# Patient Record
Sex: Male | Born: 1995 | Race: White | Hispanic: No | Marital: Single | Smoking: Never smoker
Health system: Southern US, Community
[De-identification: ages and names within clinical notes are randomized; demographics above are authoritative.]

## PROBLEM LIST (undated history)

## (undated) DIAGNOSIS — F319 Bipolar disorder, unspecified: Secondary | ICD-10-CM

## (undated) DIAGNOSIS — I456 Pre-excitation syndrome: Secondary | ICD-10-CM

---

## 2020-12-28 ENCOUNTER — Encounter (HOSPITAL_COMMUNITY): Payer: Self-pay

## 2020-12-28 ENCOUNTER — Other Ambulatory Visit: Payer: Self-pay

## 2020-12-28 ENCOUNTER — Emergency Department (HOSPITAL_COMMUNITY)
Admission: EM | Admit: 2020-12-28 | Discharge: 2020-12-30 | Disposition: A | Discharge status: Other Acute Inpatient Hospital | Payer: Medicare Other | Attending: Emergency Medicine | Admitting: Emergency Medicine

## 2020-12-28 DIAGNOSIS — F840 Autistic disorder: Secondary | ICD-10-CM

## 2020-12-28 DIAGNOSIS — F22 Delusional disorders: Secondary | ICD-10-CM | POA: Insufficient documentation

## 2020-12-28 DIAGNOSIS — Z20822 Contact with and (suspected) exposure to covid-19: Secondary | ICD-10-CM | POA: Diagnosis not present

## 2020-12-28 DIAGNOSIS — Z046 Encounter for general psychiatric examination, requested by authority: Secondary | ICD-10-CM | POA: Diagnosis present

## 2020-12-28 DIAGNOSIS — F319 Bipolar disorder, unspecified: Secondary | ICD-10-CM

## 2020-12-28 DIAGNOSIS — F250 Schizoaffective disorder, bipolar type: Secondary | ICD-10-CM | POA: Diagnosis not present

## 2020-12-28 DIAGNOSIS — R45851 Suicidal ideations: Secondary | ICD-10-CM | POA: Insufficient documentation

## 2020-12-28 LAB — CBC
HCT: 47.5 % (ref 39.0–52.0)
Hemoglobin: 15.5 g/dL (ref 13.0–17.0)
MCH: 31.6 pg (ref 26.0–34.0)
MCHC: 32.6 g/dL (ref 30.0–36.0)
MCV: 96.7 fL (ref 80.0–100.0)
Platelets: 247 10*3/uL (ref 150–400)
RBC: 4.91 MIL/uL (ref 4.22–5.81)
RDW: 12.7 % (ref 11.5–15.5)
WBC: 8.2 10*3/uL (ref 4.0–10.5)
nRBC: 0 % (ref 0.0–0.2)

## 2020-12-28 NOTE — ED Triage Notes (Signed)
Pt arrives with [facility] after mother petitioned IVC papers stating pt vocalized suicidal ideation. Pt denies and says he is unsure why he is here.

## 2020-12-29 DIAGNOSIS — F840 Autistic disorder: Secondary | ICD-10-CM

## 2020-12-29 DIAGNOSIS — F319 Bipolar disorder, unspecified: Secondary | ICD-10-CM

## 2020-12-29 DIAGNOSIS — F250 Schizoaffective disorder, bipolar type: Secondary | ICD-10-CM | POA: Diagnosis not present

## 2020-12-29 LAB — COMPREHENSIVE METABOLIC PANEL
ALT: 32 U/L (ref 0–44)
AST: 27 U/L (ref 15–41)
Albumin: 4.8 g/dL (ref 3.5–5.0)
Alkaline Phosphatase: 71 U/L (ref 38–126)
Anion gap: 9 (ref 5–15)
BUN: 17 mg/dL (ref 6–20)
CO2: 27 mmol/L (ref 22–32)
Calcium: 9.5 mg/dL (ref 8.9–10.3)
Chloride: 104 mmol/L (ref 98–111)
Creatinine, Ser: 0.8 mg/dL (ref 0.61–1.24)
GFR, Estimated: >60 mL/min (ref >60)
Glucose, Bld: 113 mg/dL — ABNORMAL HIGH (ref 70–99)
Potassium: 3.6 mmol/L (ref 3.5–5.1)
Sodium: 140 mmol/L (ref 135–145)
Total Bilirubin: 1.4 mg/dL — ABNORMAL HIGH (ref 0.3–1.2)
Total Protein: 7.4 g/dL (ref 6.5–8.1)

## 2020-12-29 LAB — ACETAMINOPHEN LEVEL: Acetaminophen (Tylenol), Serum: <10 ug/mL — ABNORMAL LOW (ref 10–30)

## 2020-12-29 LAB — RAPID URINE DRUG SCREEN, HOSP PERFORMED
Amphetamines: NOT DETECTED
Barbiturates: NOT DETECTED
Benzodiazepines: NOT DETECTED
Cocaine: NOT DETECTED
Opiates: NOT DETECTED
Tetrahydrocannabinol: NOT DETECTED

## 2020-12-29 LAB — ETHANOL: Alcohol, Ethyl (B): <10 mg/dL (ref <10)

## 2020-12-29 LAB — RESP PANEL BY RT-PCR (FLU A&B, COVID) ARPGX2
Influenza A by PCR: NEGATIVE
Influenza B by PCR: NEGATIVE
SARS Coronavirus 2 by RT PCR: NEGATIVE

## 2020-12-29 LAB — SALICYLATE LEVEL: Salicylate Lvl: <7 mg/dL — ABNORMAL LOW (ref 7.0–30.0)

## 2020-12-29 MED ORDER — RISPERIDONE 1 MG PO TBDP
2.0000 mg | ORAL_TABLET | Freq: Two times a day (BID) | ORAL | Status: DC
  Administered 2020-12-29 (×2): 2 mg via ORAL
  Filled 2020-12-29 (×3): qty 2

## 2020-12-29 NOTE — ED Provider Notes (Signed)
Emergency Medicine Observation Re-evaluation Note  Miguel Shea is a 24 y.o. male, seen on rounds today.  Pt initially presented to the ED for complaints of IVC Currently, the patient is awaiting TTS evaluation  Physical Exam  BP 115/74 (BP Location: Left Arm)   Pulse 72   Temp 99.4 F (37.4 C)   Resp 16   Ht 6' (1.829 m)   Wt 81.6 kg   SpO2 100%   BMI 24.41 kg/m  Physical Exam General: Sleeping in no distress Psych: Calm  ED Course / MDM  EKG:    I have reviewed the labs performed to date as well as medications administered while in observation.  Recent changes in the last 24 hours include none.  Plan  Current plan is for TTS evaluation. Patient is under full IVC at this time.   Pollyann Savoy, MD 12/29/20 [phone]

## 2020-12-29 NOTE — Consult Note (Signed)
Patient has documented history Bipolar 1 disorder. Patient assessed by this nurse practitioner along with Dr Nelly Rout.  Patient alert and oriented, participates reluctantly in assessment.  Patient presents with guarded affect and appears to be a poor historian. Patient reports current stressor includes and "falling out with my mom."  Patient appears paranoid, states "I am not sure why you are asking me questions." Patient unable to recall current psychotropic medications. Medications: -Risperidone M tab 2 mg twice daily  Inpatient psychiatric treatment recommended.

## 2020-12-29 NOTE — BH Assessment (Addendum)
[facility] Assessment Progress Note  Per Nelly Rout, MD, this pt requires psychiatric hospitalization at this time.  Pt presents under IVC initiated by pt's mother and upheld by EDP Drema Pry, MD.  The following facilities have been contacted to seek placement for this pt, with results as noted:  Beds available, information sent, decision pending: Beckie Busing [facility]  Unable to reach: Turner Daniels (left message at 15:46)   Doylene Canning, MA Behavioral Health Coordinator [phone]

## 2020-12-29 NOTE — BH Assessment (Signed)
Aloha Gell, RN notified through secure chat that per Berneice Heinrich, NP, patient is recommend for inpatient treatment.  Patient mother Natalia Leatherwood [phone] would like a call about patient treatment.

## 2020-12-29 NOTE — Care Management (Signed)
Writer informed the ERMD of the disposition as well as the Press photographer.

## 2020-12-29 NOTE — ED Provider Notes (Signed)
[facility]   CSN: [id number] Arrival date & time: 12/28/20  2301     History Chief Complaint  Patient presents with  . IVC    Miguel Shea is a 24 y.o. male.  Patient to ED under IVC filed by his mother who is concerned about paranoid behavior and suicidal ideations. The patient states he is not aware of why he is here. He denies SI/HI/AVH. He denies recent illness or physical complaints.   The history is provided by the patient. No language interpreter was used.       History reviewed. No pertinent past medical history.  There are no problems to display for this patient.   History reviewed. No pertinent surgical history.     No family history on file.  Social History   Tobacco Use  . Smoking status: Never Smoker  . Smokeless tobacco: Never Used  Substance Use Topics  . Alcohol use: Never  . Drug use: Never    Home Medications Prior to Admission medications   Not on File    Allergies    Patient has no known allergies.  Review of Systems   Review of Systems  Constitutional: Negative for chills and fever.  HENT: Negative.   Respiratory: Negative.   Cardiovascular: Negative.   Gastrointestinal: Negative.   Musculoskeletal: Negative.   Skin: Negative.   Neurological: Negative.   Psychiatric/Behavioral: Negative for self-injury and suicidal ideas.    Physical Exam Updated Vital Signs BP (!) 145/110 (BP Location: Left Arm)   Pulse 90   Temp (!) 100.7 F (38.2 C) (Oral)   Resp 16   Ht 6' (1.829 m)   Wt 81.6 kg   SpO2 99%   BMI 24.41 kg/m   Physical Exam Vitals and nursing note reviewed.  Constitutional:      Appearance: He is well-developed and well-nourished.  HENT:     Head: Normocephalic.  Cardiovascular:     Rate and Rhythm: Normal rate and regular rhythm.  Pulmonary:     Effort: Pulmonary effort is normal.     Breath sounds: Normal breath sounds.  Abdominal:     General: Bowel  sounds are normal.     Palpations: Abdomen is soft.     Tenderness: There is no abdominal tenderness. There is no guarding or rebound.  Musculoskeletal:        General: Normal range of motion.     Cervical back: Normal range of motion and neck supple.  Skin:    General: Skin is warm and dry.     Findings: No rash.  Neurological:     Mental Status: He is alert and oriented to person, place, and time.  Psychiatric:        Attention and Perception: Perception normal.        Mood and Affect: Mood and affect normal.        Speech: Speech normal.        Behavior: Behavior is cooperative.        Thought Content: Thought content does not include homicidal or suicidal ideation.     ED Results / Procedures / Treatments   Labs (all labs ordered are listed, but only abnormal results are displayed) Labs Reviewed  COMPREHENSIVE METABOLIC PANEL - Abnormal; Notable for the following components:      Result Value   Glucose, Bld 113 (*)    Total Bilirubin 1.4 (*)    All other components within normal limits  SALICYLATE LEVEL -  Abnormal; Notable for the following components:   Salicylate Lvl <7.0 (*)    All other components within normal limits  ACETAMINOPHEN LEVEL - Abnormal; Notable for the following components:   Acetaminophen (Tylenol), Serum <10 (*)    All other components within normal limits  RESP PANEL BY RT-PCR (FLU A&B, COVID) ARPGX2  ETHANOL  CBC  RAPID URINE DRUG SCREEN, HOSP PERFORMED    EKG None  Radiology No results found.  Procedures Procedures (including critical care time)  Medications Ordered in ED Medications - No data to display  ED Course  I have reviewed the triage vital signs and the nursing notes.  Pertinent labs & imaging results that were available during my care of the patient were reviewed by me and considered in my medical decision making (see chart for details).    MDM Rules/Calculators/A&P                          Patient to ED under IVC  citing that he is not sleeping; has made suicidal statements; has made statements regarding a chip in his head that tells him what to do; auditory hallucinations; paranoia surrounding the gov't.   The patient denies all. He presents with low grade temp of 100.7. No ss/sxs of illness. Labs pending.   TTS evaluation required for further evaluation/disposition recommendations.  The patient presents to the desk numerous times, >10 in short period of time, concerned about wrist injury and numbness in the hand. No injury suspected on exam. Behavior suggestive of paranoid fixation on injury.   6:30 - patient is still waiting on TTS consultation and recommendations.     Final Clinical Impression(s) / ED Diagnoses Final diagnoses:  None   1. IVC 2. Paranoia 3. SI  Rx / DC Orders ED Discharge Orders    None       Elpidio Anis, PA-C 12/29/20 6433    Nira Conn, MD 01/01/21 1407

## 2020-12-29 NOTE — ED Notes (Signed)
Pt's one bag of belongings placed in locker 32.  [facility] called a message left for transport.

## 2020-12-29 NOTE — Care Management (Signed)
   Patient has been accepted to [facility]  Patient can come at any time  Accepting Dr. Catha Gosselin  Report [phone]

## 2020-12-29 NOTE — BH Assessment (Signed)
Comprehensive Clinical Assessment (CCA) Note  12/29/2020 Miguel Shea [id number]  Miguel Shea is a 24 year old male presenting to [facility] under IVC by a family member due to patient making suicidal comments. IVC reads verbatim, "respondent is not sleeping, he states he will kill himself, he stated he would kill himself if a girl on the internet does not love him, he says the chip in his brain tells him what to do, he states the voices talk to him, he is being haunted by the government".  Patient reports that his mother IVC'd him for "no reason". Patient reports that his mom wants to "control him" and reports that his mom is diagnosed with "bipolar disorder". Patient makes several statements about his mom such as "she is crazy, and she is trying to mess up the situation I'm in". Patient presents as guarded and does not elaborate about the situation he is in but reports "its personal". Patient reports that he went to [place] and his mom did not like it and patient states "she just need to let me go and grow up".   Patient reports living in his grandmother house alone. 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 Patient denies having a mental health disorder, denies inpatient and outpatient treatment. Patient denies SI/HI/AVH and substance use. Patient denies history of SI. Patient denies everything that was stated in IVC.   Collateral information gathered from patient mom Izekiel Flegel who reports that patient has been hospitalized 5 times in his life for paranoid delusions and manic behaviors. Mom reports that patient was hospitalized at [facility] and in [place] in the past few months due to ongoing psychosis and delusions. Mom reports that patient has delusions of being in "battle royal" where the government has a Nurse, children's out to kill him. Mom reports that patient reported that he needed to save a girl from  being killed and if he cannot save the girl, he will kill himself. Mom report that patient was awarded disability and he received a lump sum of money in his account. Mom reports that one day patient took her cat with plans to drive to [place] to help this girl. Mom reports that patient stopped at a hotel in [place], and she was able to call law enforcement to do a wellness check. Patient voluntarily went to hospital and was kept for a few days. Mom states the plan was for [place] hospital staff to drop patient off at airport and he was going to fly back to [facility] but patient bought a plan ticket to [place]. Mom reports that patient went to [place] and [place] and was trying to get his identity changed so he couldn't be found by the government. Mom reports calling law enforcement and having patient be placed on a national database when he travels. Mom reports that patient came home on 12/27/20 and on 12/28/20 patient shared with his mom that he had a chip in his head that tells him who is dangerous. Mom reports that patient was talking about getting into his father gun safe for protection. Mom reports that patient father died about two years ago and no one knows the combination to the safe, however, patient use to have the combination but cannot remember it.  Mom reports that patient older brother petitioned to become guardian however due to patient leaving, the court date was rescheduled for next month.  Mom thinks patient needs inpatient treatment. Mom reports that patient has history  of taking lithium but is currently taking Risperdal and other medications, but patient is not compliant with his medications. Mom reports patient having diagnosis of schizoaffective disorder, bipolar type and autism.  Disposition: Per Berneice Heinrich, NP, patient is recommended for inpatient treatment.   Chief Complaint:  Chief Complaint  Patient presents with  . IVC   Visit Diagnosis: Schizoaffective disorder, bipolar type- Per report  from mother   CCA Screening, Triage and Referral (STR)  Patient Reported Information How did you hear about [place]? Family/Friend  Referral name: No data recorded Referral phone number: No data recorded  Whom do you see for routine medical problems? No data recorded Practice/Facility Name: No data recorded Practice/Facility Phone Number: No data recorded Name of Contact: No data recorded Contact Number: No data recorded Contact Fax Number: No data recorded Prescriber Name: No data recorded Prescriber Address (if known): No data recorded  What Is the Reason for Your Visit/Call Today? No data recorded How Long Has This Been Causing You Problems? No data recorded What Do You Feel Would Help You the Most Today? No data recorded  Have You Recently Been in Any Inpatient Treatment (Hospital/Detox/Crisis Center/28-Day Program)? Yes  Name/Location of Program/Hospital:No data recorded How Long Were You There? No data recorded When Were You Discharged? No data recorded  Have You Ever Received Services From [facility] Before? No  Who Do You See at [facility]? No data recorded  Have You Recently Had Any Thoughts About Hurting Yourself? No  Are You Planning to Commit Suicide/Harm Yourself At This time? No   Have you Recently Had Thoughts About Hurting Someone Karolee Ohs? No  Explanation: No data recorded  Have You Used Any Alcohol or Drugs in the Past 24 Hours? No  How Long Ago Did You Use Drugs or Alcohol? No data recorded What Did You Use and How Much? No data recorded  Do You Currently Have a Therapist/Psychiatrist? No  Name of Therapist/Psychiatrist: No data recorded  Have You Been Recently Discharged From Any Office Practice or Programs? No  Explanation of Discharge From Practice/Program: No data recorded    CCA Screening Triage Referral Assessment Type of Contact: Tele-Assessment  Is this Initial or Reassessment? Initial Assessment  Date Telepsych consult ordered in [facility]:   No data recorded Time Telepsych consult ordered in [facility]:  No data recorded  Patient Reported Information Reviewed? Yes  Patient Left Without Being Seen? No data recorded Reason for Not Completing Assessment: No data recorded  Collateral Involvement: Natalia Leatherwood Arquette-mom   Does Patient Have a Automotive engineer Guardian? No data recorded Name and Contact of Legal Guardian: No data recorded If Minor and Not Living with Parent(s), Who has Custody? No data recorded Is CPS involved or ever been involved? Never  Is APS involved or ever been involved? Never   Patient Determined To Be At Risk for Harm To Self or Others Based on Review of Patient Reported Information or Presenting Complaint? Yes, for Self-Harm  Method: No data recorded Availability of Means: No data recorded Intent: No data recorded Notification Required: No data recorded Additional Information for Danger to Others Potential: No data recorded Additional Comments for Danger to Others Potential: No data recorded Are There Guns or Other Weapons in Your Home? No data recorded Types of Guns/Weapons: No data recorded Are These Weapons Safely Secured?                            No data  recorded Who Could Verify You Are Able To Have These Secured: No data recorded Do You Have any Outstanding Charges, Pending Court Dates, Parole/Probation? No data recorded Contacted To Inform of Risk of Harm To Self or Others: No data recorded  Location of Assessment: [facility] ED   Does Patient Present under Involuntary Commitment? Yes  IVC Papers Initial File Date: 12/28/2020   [place]   Patient Currently Receiving the Following Services: Medication Management   Determination of Need: Emergent (2 hours)   Options For Referral: No data recorded    CCA Biopsychosocial Intake/Chief Complaint:  IVC  Current Symptoms/Problems: paranoid delusions   Patient Reported Schizophrenia/Schizoaffective Diagnosis in  Past: Yes (per mom report)   Strengths: UTA  Preferences: UTA  Abilities: UTA   Type of Services Patient Feels are Needed: No data recorded  Initial Clinical Notes/Concerns: guarded   Mental Health Symptoms Depression:  None   Duration of Depressive symptoms: No data recorded  Mania:  Recklessness   Anxiety:   None   Psychosis:  Delusions   Duration of Psychotic symptoms: Greater than six months   Trauma:  None   Obsessions:  None   Compulsions:  None   Inattention:  None   Hyperactivity/Impulsivity:  N/A   Oppositional/Defiant Behaviors:  N/A   Emotional Irregularity:  N/A   Other Mood/Personality Symptoms:  No data recorded   Mental Status Exam Appearance and self-care  Stature:  Average   Weight:  Average weight   Clothing:  Age-appropriate   Grooming:  Normal   Cosmetic use:  None   Posture/gait:  Normal   Motor activity:  Not Remarkable   Sensorium  Attention:  Normal   Concentration:  Normal   Orientation:  X5   Recall/memory:  Normal   Affect and Mood  Affect:  Full Range   Mood:  No data recorded  Relating  Eye contact:  Normal   Facial expression:  Responsive   Attitude toward examiner:  Guarded   Thought and Language  Speech flow: Clear and Coherent   Thought content:  Appropriate to Mood and Circumstances   Preoccupation:  None   Hallucinations:  None   Organization:  No data recorded  Affiliated Computer ServicesExecutive Functions  Fund of Knowledge:  Fair   Intelligence:  Average   Abstraction:  Normal   Judgement:  Impaired   Reality Testing:  Distorted   Insight:  Fair   Decision Making:  Impulsive   Social Functioning  Social Maturity:  No data recorded  Social Judgement:  No data recorded  Stress  Stressors:  No data recorded  Coping Ability:  Overwhelmed   Skill Deficits:  Responsibility   Supports:  Family     Religion:    Leisure/Recreation:    Exercise/Diet:     CCA  Employment/Education Employment/Work Situation: Employment / Work Situation Employment situation: On disability Where was the patient employed at that time?: [employer] Has patient ever been in the Eli Lilly and Companymilitary?: No  Education: Education Is Patient Currently Attending School?: No Did Garment/textile technologistYou Graduate From McGraw-HillHigh School?: Yes Did Theme park managerYou Attend College?: Yes Did Designer, television/film setYou Attend Graduate School?: No Did You Have An Individualized Education Program (IIEP): No   CCA Family/Childhood History Family and Relationship History: Family history What is your sexual orientation?: UTA Has your sexual activity been affected by drugs, alcohol, medication, or emotional stress?: UTA  Childhood History:  Childhood History By whom was/is the patient raised?: Both parents Additional childhood history information: UTA Description of patient's  relationship with caregiver when they were a child: UTA Patient's description of current relationship with people who raised him/her: UTA How were you disciplined when you got in trouble as a child/adolescent?: UTA Does patient have siblings?: Yes  Child/Adolescent Assessment:     CCA Substance Use Alcohol/Drug Use: Alcohol / Drug Use Pain Medications: See MAR Prescriptions: See MAR Over the Counter: See MAR History of alcohol / drug use?: No history of alcohol / drug abuse                         ASAM's:  Six Dimensions of Multidimensional Assessment  Dimension 1:  Acute Intoxication and/or Withdrawal Potential:      Dimension 2:  Biomedical Conditions and Complications:      Dimension 3:  Emotional, Behavioral, or Cognitive Conditions and Complications:     Dimension 4:  Readiness to Change:     Dimension 5:  Relapse, Continued use, or Continued Problem Potential:     Dimension 6:  Recovery/Living Environment:     ASAM Severity Score:    ASAM Recommended Level of Treatment:     Substance use Disorder (SUD)    Recommendations for  Services/Supports/Treatments:    DSM5 Diagnoses: There are no problems to display for this patient.   Disposition: Per Berneice Heinrich, NP, patient is recommended for inpatient treatment.   Amos Micheals Shirlee More, [facility]

## 2020-12-29 NOTE — ED Notes (Signed)
TTS completed via televideo.

## 2020-12-29 NOTE — ED Notes (Signed)
Patient sitting in the doorway with no socks on. Patient stated his socks were too big. Sitter gave the patient a smaller size.

## 2020-12-30 NOTE — ED Provider Notes (Signed)
Emergency Medicine Observation Re-evaluation Note  Cruise Baumgardner is a 24 y.o. male, seen on rounds today.  Pt initially presented to the ED for complaints of IVC Currently, the patient is stable.  Physical Exam  BP 109/68 (BP Location: Right Arm)   Pulse 71   Temp 99.1 F (37.3 C) (Oral)   Resp 18   Ht 1.829 m (6')   Wt 81.6 kg   SpO2 97%   BMI 24.41 kg/m  Physical Exam General: wdwn male nad Cardiac: rrr Lungs: normal respiratory rate Psych: interactive   ED Course / MDM  EKG:    I have reviewed the labs performed to date as well as medications administered while in observation.  Recent changes in the last 24 hours include patient with placement at [facility].  Plan  Current plan is for psychiatric hospitalization. Patient is under full IVC at this time.   Margarita Grizzle, MD 12/30/20 [phone]

## 2021-03-01 ENCOUNTER — Encounter (HOSPITAL_COMMUNITY): Payer: Self-pay

## 2021-03-01 ENCOUNTER — Other Ambulatory Visit: Payer: Self-pay

## 2021-03-01 ENCOUNTER — Emergency Department (HOSPITAL_COMMUNITY)
Admission: EM | Admit: 2021-03-01 | Discharge: 2021-03-04 | Disposition: A | Discharge status: Home / Self Care | Payer: Medicare Other | Attending: Emergency Medicine | Admitting: Emergency Medicine

## 2021-03-01 DIAGNOSIS — F22 Delusional disorders: Secondary | ICD-10-CM | POA: Insufficient documentation

## 2021-03-01 DIAGNOSIS — I456 Pre-excitation syndrome: Secondary | ICD-10-CM | POA: Diagnosis not present

## 2021-03-01 DIAGNOSIS — F250 Schizoaffective disorder, bipolar type: Secondary | ICD-10-CM | POA: Insufficient documentation

## 2021-03-01 DIAGNOSIS — R519 Headache, unspecified: Secondary | ICD-10-CM | POA: Diagnosis present

## 2021-03-01 DIAGNOSIS — Z20822 Contact with and (suspected) exposure to covid-19: Secondary | ICD-10-CM | POA: Diagnosis not present

## 2021-03-01 DIAGNOSIS — F840 Autistic disorder: Secondary | ICD-10-CM | POA: Diagnosis not present

## 2021-03-01 DIAGNOSIS — F319 Bipolar disorder, unspecified: Secondary | ICD-10-CM | POA: Diagnosis present

## 2021-03-01 HISTORY — DX: Pre-excitation syndrome: I45.6

## 2021-03-01 HISTORY — DX: Bipolar disorder, unspecified: F31.9

## 2021-03-01 NOTE — ED Notes (Signed)
Called pt due to pt labels in trash can and no answer

## 2021-03-01 NOTE — ED Notes (Signed)
Pt came back inside with mother. Mother expressed concern for mental help. Pt expressed he just wanted a CT or MRI scan due to feeling fuzzy inside his head and that he wasn't having SI or HI

## 2021-03-01 NOTE — ED Triage Notes (Signed)
Pt c/o of headache for the past few months everyday. Denies nausea or photosensitivity

## 2021-03-02 ENCOUNTER — Other Ambulatory Visit: Payer: Self-pay

## 2021-03-02 ENCOUNTER — Emergency Department (HOSPITAL_COMMUNITY): Payer: Medicare Other

## 2021-03-02 DIAGNOSIS — F250 Schizoaffective disorder, bipolar type: Secondary | ICD-10-CM | POA: Diagnosis not present

## 2021-03-02 LAB — CBC WITH DIFFERENTIAL/PLATELET
Abs Immature Granulocytes: 0.03 10*3/uL (ref 0.00–0.07)
Basophils Absolute: 0.1 10*3/uL (ref 0.0–0.1)
Basophils Relative: 1 %
Eosinophils Absolute: 0 10*3/uL (ref 0.0–0.5)
Eosinophils Relative: 0 %
HCT: 45.7 % (ref 39.0–52.0)
Hemoglobin: 15.6 g/dL (ref 13.0–17.0)
Immature Granulocytes: 0 %
Lymphocytes Relative: 13 %
Lymphs Abs: 1.1 10*3/uL (ref 0.7–4.0)
MCH: 31.5 pg (ref 26.0–34.0)
MCHC: 34.1 g/dL (ref 30.0–36.0)
MCV: 92.3 fL (ref 80.0–100.0)
Monocytes Absolute: 0.6 10*3/uL (ref 0.1–1.0)
Monocytes Relative: 7 %
Neutro Abs: 6.6 10*3/uL (ref 1.7–7.7)
Neutrophils Relative %: 79 %
Platelets: 258 10*3/uL (ref 150–400)
RBC: 4.95 MIL/uL (ref 4.22–5.81)
RDW: 12.9 % (ref 11.5–15.5)
WBC: 8.4 10*3/uL (ref 4.0–10.5)
nRBC: 0 % (ref 0.0–0.2)

## 2021-03-02 LAB — COMPREHENSIVE METABOLIC PANEL
ALT: 33 U/L (ref 0–44)
AST: 33 U/L (ref 15–41)
Albumin: 4.7 g/dL (ref 3.5–5.0)
Alkaline Phosphatase: 58 U/L (ref 38–126)
Anion gap: 12 (ref 5–15)
BUN: 12 mg/dL (ref 6–20)
CO2: 23 mmol/L (ref 22–32)
Calcium: 9.6 mg/dL (ref 8.9–10.3)
Chloride: 104 mmol/L (ref 98–111)
Creatinine, Ser: 0.88 mg/dL (ref 0.61–1.24)
GFR, Estimated: >60 mL/min (ref >60)
Glucose, Bld: 122 mg/dL — ABNORMAL HIGH (ref 70–99)
Potassium: 3.4 mmol/L — ABNORMAL LOW (ref 3.5–5.1)
Sodium: 139 mmol/L (ref 135–145)
Total Bilirubin: 3.1 mg/dL — ABNORMAL HIGH (ref 0.3–1.2)
Total Protein: 7.1 g/dL (ref 6.5–8.1)

## 2021-03-02 LAB — RAPID URINE DRUG SCREEN, HOSP PERFORMED
Amphetamines: NOT DETECTED
Barbiturates: NOT DETECTED
Benzodiazepines: NOT DETECTED
Cocaine: NOT DETECTED
Opiates: NOT DETECTED
Tetrahydrocannabinol: NOT DETECTED

## 2021-03-02 LAB — ETHANOL: Alcohol, Ethyl (B): <10 mg/dL (ref <10)

## 2021-03-02 LAB — RESP PANEL BY RT-PCR (FLU A&B, COVID) ARPGX2
Influenza A by PCR: NEGATIVE
Influenza B by PCR: NEGATIVE
SARS Coronavirus 2 by RT PCR: NEGATIVE

## 2021-03-02 LAB — ACETAMINOPHEN LEVEL: Acetaminophen (Tylenol), Serum: <10 ug/mL — ABNORMAL LOW (ref 10–30)

## 2021-03-02 LAB — SALICYLATE LEVEL: Salicylate Lvl: <7 mg/dL — ABNORMAL LOW (ref 7.0–30.0)

## 2021-03-02 MED ORDER — RISPERIDONE 2 MG PO TBDP
2.0000 mg | ORAL_TABLET | Freq: Three times a day (TID) | ORAL | Status: DC | PRN
  Administered 2021-03-02 – 2021-03-03 (×4): 2 mg via ORAL
  Filled 2021-03-02 (×5): qty 1

## 2021-03-02 MED ORDER — DIVALPROEX SODIUM 250 MG PO DR TAB
500.0000 mg | DELAYED_RELEASE_TABLET | Freq: Two times a day (BID) | ORAL | Status: DC
  Administered 2021-03-02 – 2021-03-04 (×4): 500 mg via ORAL
  Filled 2021-03-02 (×4): qty 2

## 2021-03-02 MED ORDER — OLANZAPINE 5 MG PO TBDP
10.0000 mg | ORAL_TABLET | Freq: Every day | ORAL | Status: DC
  Administered 2021-03-02 – 2021-03-03 (×2): 10 mg via ORAL
  Filled 2021-03-02 (×3): qty 2

## 2021-03-02 MED ORDER — LORAZEPAM 1 MG PO TABS
1.0000 mg | ORAL_TABLET | ORAL | Status: AC | PRN
  Administered 2021-03-04: 1 mg via ORAL
  Filled 2021-03-02: qty 1

## 2021-03-02 MED ORDER — ZIPRASIDONE MESYLATE 20 MG IM SOLR: 20.0000 mg | INTRAMUSCULAR | Status: DC | PRN

## 2021-03-02 MED ORDER — BENZTROPINE MESYLATE 1 MG PO TABS
1.0000 mg | ORAL_TABLET | Freq: Every day | ORAL | Status: DC
  Administered 2021-03-02 – 2021-03-03 (×2): 1 mg via ORAL
  Filled 2021-03-02 (×2): qty 1

## 2021-03-02 MED ORDER — OLANZAPINE 5 MG PO TBDP
5.0000 mg | ORAL_TABLET | Freq: Every morning | ORAL | Status: DC
  Administered 2021-03-02 – 2021-03-04 (×3): 5 mg via ORAL
  Filled 2021-03-02 (×4): qty 1

## 2021-03-02 MED ORDER — LORAZEPAM 1 MG PO TABS
1.0000 mg | ORAL_TABLET | Freq: Once | ORAL | Status: AC
  Administered 2021-03-02: 1 mg via ORAL
  Filled 2021-03-02: qty 1

## 2021-03-02 NOTE — BH Assessment (Signed)
Ma Deberah Pelton, RN notified through secure chat that per Marciano Sequin, NP, pt is recommended for inpatient treatment.

## 2021-03-02 NOTE — ED Provider Notes (Signed)
[facility] EMERGENCY DEPARTMENT Provider Note   CSN: [id number] Arrival date & time: 03/01/21  2019     History Chief Complaint  Patient presents with  . Headache    Miguel Shea is a 25 y.o. male.  The history is provided by the patient and medical records.    25 year old male with history of autism, bipolar disorder, presenting to the ED for "help".  Patient initially reported to triage that he was having headaches for the past few months, he denies this to me.  He states "I think there is a chip in my brain that is keeping me from sleeping".  States this causes him to have random thoughts which he is having a hard time controlling.  When asked him about this he began speaking Laurance Flatten and how he asked him to go out for a beer so hopefully they can make peace between the [place] and [place].  He then began speaking about becoming an Hydrographic surveyor and felt like he had "potential".  He states he feels like he needs some medicine to calm him down.  He denies any suicidal ideation at present.  Denies any homicidal ideation.  Denies drug or alcohol abuse.  He states he has not been taking his psychiatric medications, cannot specify why.  He does admit he has not been sleeping for several days.  Past Medical History:  Diagnosis Date  . Wolff-Parkinson-White syndrome     Patient Active Problem List   Diagnosis Date Noted  . Autism spectrum disorder 12/29/2020  . Bipolar 1 disorder (HCC) 12/29/2020    History reviewed. No pertinent surgical history.     No family history on file.  Social History   Tobacco Use  . Smoking status: Never Smoker  . Smokeless tobacco: Never Used  Substance Use Topics  . Alcohol use: Never  . Drug use: Never    Home Medications Prior to Admission medications   Not on File    Allergies    Augmentin [amoxicillin-pot clavulanate]  Review of Systems   Review of Systems  Psychiatric/Behavioral: Positive for behavioral  problems.  All other systems reviewed and are negative.   Physical Exam Updated Vital Signs BP (!) 144/94 (BP Location: Right Arm)   Pulse 98   Temp 98.9 F (37.2 C) (Oral)   Resp 18   SpO2 100%   Physical Exam Vitals and nursing note reviewed.  Constitutional:      Appearance: He is well-developed and well-nourished.  HENT:     Head: Normocephalic and atraumatic.     Mouth/Throat:     Mouth: Oropharynx is clear and moist.  Eyes:     Extraocular Movements: EOM normal.     Conjunctiva/sclera: Conjunctivae normal.     Pupils: Pupils are equal, round, and reactive to light.     Comments: Pupils dilated  Cardiovascular:     Rate and Rhythm: Normal rate and regular rhythm.     Heart sounds: Normal heart sounds.  Pulmonary:     Effort: Pulmonary effort is normal.     Breath sounds: Normal breath sounds.  Abdominal:     General: Bowel sounds are normal.     Palpations: Abdomen is soft.  Musculoskeletal:        General: Normal range of motion.     Cervical back: Normal range of motion.  Skin:    General: Skin is warm and dry.  Neurological:     Mental Status: He is alert and oriented to  person, place, and time.  Psychiatric:        Mood and Affect: Mood and affect normal.     Comments: Tangential speech, requiring redirection several times, delusional thoughts expressed, throughout exam raising his arms up several times and shaking fists as if saying "victory" Denies SI/HI/AVH     ED Results / Procedures / Treatments   Labs (all labs ordered are listed, but only abnormal results are displayed) Labs Reviewed  COMPREHENSIVE METABOLIC PANEL - Abnormal; Notable for the following components:      Result Value   Potassium 3.4 (*)    Glucose, Bld 122 (*)    Total Bilirubin 3.1 (*)    All other components within normal limits  SALICYLATE LEVEL - Abnormal; Notable for the following components:   Salicylate Lvl <7.0 (*)    All other components within normal limits   ACETAMINOPHEN LEVEL - Abnormal; Notable for the following components:   Acetaminophen (Tylenol), Serum <10 (*)    All other components within normal limits  RESP PANEL BY RT-PCR (FLU A&B, COVID) ARPGX2  CBC WITH DIFFERENTIAL/PLATELET  ETHANOL  RAPID URINE DRUG SCREEN, HOSP PERFORMED    EKG None  Radiology No results found.  Procedures Procedures   Medications Ordered in ED Medications  LORazepam (ATIVAN) tablet 1 mg (1 mg Oral Given 03/02/21 0240)    ED Course  I have reviewed the triage vital signs and the nursing notes.  Pertinent labs & imaging results that were available during my care of the patient were reviewed by me and considered in my medical decision making (see chart for details).    MDM Rules/Calculators/A&P  25 y.o. M here with reported headache but denies this to me.  States he feels like he has a chip in his head putting thoughts there that he cannot control.  He is displaying tangential speech, requiring redirection multiple times.  Throughout exam he continues lifting up his arms and shaking his fists as if saying "victory!".  He does admit to difficulty sleeping for a few days now.  He is afebrile, non-toxic.  Denies physical complaints currently.  He is somewhat anxious in appearance and requested something to help him calm down.  He was given dose of ativan.  Labs pending.  Will get TTS consult.  Appears that mother brought him here and had concerns for his mental health, no number in chart to contact her.  Labs reassuring.  covid negative.  Medically clear.  TTS evaluation pending.  Final Clinical Impression(s) / ED Diagnoses Final diagnoses:  Delusional thoughts [facility])    Rx / DC Orders ED Discharge Orders    None       Garlon Hatchet, PA-C 03/02/21 6546    Jacalyn Lefevre, MD 03/02/21 [phone]

## 2021-03-02 NOTE — BH Assessment (Addendum)
Patient continues to meet inpatient treatment.  Disposition Counselor followed up with [facility] Debbe Bales, RN) regarding placement at [facility] for psychiatric stabilization. Per [facility], patient's ability to program on the unit is unknown. Patient will not provide staff consent to speak with mother and/or any other supports to acquire information need that will determine if [facility] is an appropriate facility to meet his needs..   Therefore, Disposition Counselor was advised by the [facility] Debbe Bales, RN) to fax patient out. Will revisit patient's appropriateness to [facility] inpatient unit during tomorrows am bed meeting. Meanwhile, patient faxed to the following facilities for review:  [facility]      [facility] Details     [facility] Details     [facility] Details     [facility] Details     [facility] Details     [facility] Details     [facility]     [facility] Details     [facility]     [facility] Details     [facility] Details     [facility]        Patient's nurse Magda Paganini, RN) and EDP [facility]) provided updates of all information noted above.

## 2021-03-02 NOTE — ED Notes (Signed)
ED Provider at bedside. 

## 2021-03-02 NOTE — ED Notes (Signed)
Patient requesting CT scan of brain, expresses concern that he has a computer chip in his head and that he is hearing the voices of all of the people in the world. Patient states repeatedly "lets resolve this before someone gets hurt" and "you know how this ends".

## 2021-03-02 NOTE — BH Assessment (Signed)
Comprehensive Clinical Assessment (CCA) Note  03/02/2021 Miguel Shea [id number]  Miguel Shea is a 25 year old male presenting to [facility] voluntarily with chief complaint of having a headache. Patient reports coming to ED today due to "erratic behaviors because I didn't get enough sleep which caused me to have delusions". Patient reports getting 6-8 hours of sleep a night. Patient reports having "tics" or involuntarily movement of his mouth for the past month that has keeps him up at night. The right side of patient mouth and eye twitches but as the assessment progresses the movements become less profound. Movements don't appear to be related to tardive dyskinesia but appears more voluntary. Patient reports that tics make him feel like he doesn't have free will. Patient reports stress is another factor preventing him from sleeping. Patient reports having intrusive ruminating thoughts about the war and nuclear missiles attacking the [place]. 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   Patient denies SI, HI, AVH and substance use. Patient reports a history of passive suicidal ideation but denies any suicide attempts. Patient however reports symptoms of thought insertion related to "people projecting their thoughts to me telling me they want me to kill myself". Clinician asks patient if he could identify "people" and patient responds, "what if it's all the people in the world". Patient is paranoid and state that he doesn't trust his mother because she wants him to kill himself with a shotgun she has in her room. Patient makes statements about people mocking him and lying to him about the chip in his brain. Patient is actively responding to internal stimuli during the assessment as evidence of him grabbing his head and making statements about difficulty controlling the thoughts and looking around the room as if  something was in the room with him. Patient tells clinician "You need to see a priest because you are a monster". Patient does not consent for TTS to contact his mother for collateral information. Patient is guarded about his mental health history and denies history of inpatient treatment. Per chart review patient was assessed on 12/29/20 and recommended for inpatient treatment due to similar symptoms.  Disposition: Per Marciano Sequin, NP, pt is recommended for inpatient treatment.    Chief Complaint:  Chief Complaint  Patient presents with  . Headache   Visit Diagnosis: Schizoaffective disorder, bipolar type    CCA Screening, Triage and Referral (STR)  Patient Reported Information How did you hear about [place]? Family/Friend  Referral name: No data recorded Referral phone number: No data recorded  Whom do you see for routine medical problems? No data recorded Practice/Facility Name: No data recorded Practice/Facility Phone Number: No data recorded Name of Contact: No data recorded Contact Number: No data recorded Contact Fax Number: No data recorded Prescriber Name: No data recorded Prescriber Address (if known): No data recorded  What Is the Reason for Your Visit/Call Today? No data recorded How Long Has This Been Causing You Problems? No data recorded What Do You Feel Would Help You the Most Today? No data recorded  Have You Recently Been in Any Inpatient Treatment (Hospital/Detox/Crisis Center/28-Day Program)? Yes  Name/Location of Program/Hospital:No data recorded How Long Were You There? No data recorded When Were You Discharged? No data recorded  Have You Ever Received Services From [facility] Before? No  Who Do You See at [facility]? No data recorded  Have You Recently Had Any Thoughts  About Hurting Yourself? No  Are You Planning to Commit Suicide/Harm Yourself At This time? No   Have you Recently Had Thoughts About Hurting Someone Karolee Ohslse? No  Explanation: No data  recorded  Have You Used Any Alcohol or Drugs in the Past 24 Hours? No  How Long Ago Did You Use Drugs or Alcohol? No data recorded What Did You Use and How Much? No data recorded  Do You Currently Have a Therapist/Psychiatrist? No  Name of Therapist/Psychiatrist: No data recorded  Have You Been Recently Discharged From Any Office Practice or Programs? No  Explanation of Discharge From Practice/Program: No data recorded    CCA Screening Triage Referral Assessment Type of Contact: Tele-Assessment  Is this Initial or Reassessment? Initial Assessment  Date Telepsych consult ordered in [facility]:  No data recorded Time Telepsych consult ordered in [facility]:  No data recorded  Patient Reported Information Reviewed? Yes  Patient Left Without Being Seen? No data recorded Reason for Not Completing Assessment: No data recorded  Collateral Involvement: Natalia LeatherwoodKatherine Eyman-mom   Does Patient Have a Automotive engineerCourt Appointed Legal Guardian? No data recorded Name and Contact of Legal Guardian: No data recorded If Minor and Not Living with Parent(s), Who has Custody? No data recorded Is CPS involved or ever been involved? Never  Is APS involved or ever been involved? Never   Patient Determined To Be At Risk for Harm To Self or Others Based on Review of Patient Reported Information or Presenting Complaint? Yes, for Self-Harm  Method: No data recorded Availability of Means: No data recorded Intent: No data recorded Notification Required: No data recorded Additional Information for Danger to Others Potential: No data recorded Additional Comments for Danger to Others Potential: No data recorded Are There Guns or Other Weapons in Your Home? No data recorded Types of Guns/Weapons: No data recorded Are These Weapons Safely Secured?                            No data recorded Who Could Verify You Are Able To Have These Secured: No data recorded Do You Have any Outstanding Charges, Pending Court Dates,  Parole/Probation? No data recorded Contacted To Inform of Risk of Harm To Self or Others: No data recorded  Location of Assessment: [facility]   Does Patient Present under Involuntary Commitment? Yes  IVC Papers Initial File Date: 12/28/2020   [place]   Patient Currently Receiving the Following Services: Medication Management   Determination of Need: Emergent (2 hours)   Options For Referral: No data recorded    CCA Biopsychosocial Intake/Chief Complaint:  Headache  Current Symptoms/Problems: paranoid delusions   Patient Reported Schizophrenia/Schizoaffective Diagnosis in Past: Yes   Strengths: UTA  Preferences: UTA  Abilities: UTA   Type of Services Patient Feels are Needed: No data recorded  Initial Clinical Notes/Concerns: pt is guarded   Mental Health Symptoms Depression:  None   Duration of Depressive symptoms: No data recorded  Mania:  Recklessness; Change in energy/activity; Irritability   Anxiety:   Worrying; Tension; Sleep   Psychosis:  Delusions; Hallucinations   Duration of Psychotic symptoms: Greater than six months   Trauma:  None   Obsessions:  None   Compulsions:  None   Inattention:  Disorganized   Hyperactivity/Impulsivity:  N/A   Oppositional/Defiant Behaviors:  N/A   Emotional Irregularity:  N/A   Other Mood/Personality Symptoms:  No data recorded   Mental Status Exam Appearance and self-care  Stature:  Average   Weight:  Average weight   Clothing:  Age-appropriate   Grooming:  Normal   Cosmetic use:  None   Posture/gait:  Normal   Motor activity:  Agitated   Sensorium  Attention:  Distractible   Concentration:  Scattered   Orientation:  Place; Person   Recall/memory:  Normal   Affect and Mood  Affect:  Anxious   Mood:  Anxious; Irritable   Relating  Eye contact:  Staring   Facial expression:  Anxious   Attitude toward examiner:  Guarded; Suspicious   Thought and Language   Speech flow: Clear and Coherent   Thought content:  Persecutions; Suspicious   Preoccupation:  Ruminations   Hallucinations:  None   Organization:  No data recorded  Affiliated [employer] Services of Knowledge:  Impoverished by (Comment)   Intelligence:  Average   Abstraction:  Normal   Judgement:  Impaired   Reality Testing:  Distorted   Insight:  Fair   Decision Making:  Impulsive   Social Functioning  Social Maturity:  Impulsive; Irresponsible   Social Judgement:  No data recorded  Stress  Stressors:  Family conflict   Coping Ability:  Human resources officer Deficits:  Responsibility   Supports:  Family     Religion:    Leisure/Recreation:    Exercise/Diet: Exercise/Diet Do You Have Any Trouble Sleeping?: Yes Explanation of Sleeping Difficulties: 6-8 hours   CCA Employment/Education Employment/Work Situation: Employment / Work Situation Employment situation: On disability Why is patient on disability: Mental Health Where was the patient employed at that time?: [employer] Has patient ever been in the [employer]?: No  Education: Education Did Garment/textile technologist From [employer]?: Yes Did Theme park manager?: Yes Did Designer, television/film set?: No Did You Have An Individualized Education Program (IIEP): No   CCA Family/Childhood History Family and Relationship History: Family history What is your sexual orientation?: UTA Has your sexual activity been affected by drugs, alcohol, medication, or emotional stress?: UTA  Childhood History:  Childhood History By whom was/is the patient raised?: Both parents Additional childhood history information: UTA Description of patient's relationship with caregiver when they were a child: UTA How were you disciplined when you got in trouble as a child/adolescent?: UTA Does patient have siblings?: Yes  Child/Adolescent Assessment:     CCA Substance Use Alcohol/Drug Use: Alcohol / Drug Use Pain Medications:  See MAR Prescriptions: See MAR Over the Counter: See MAR History of alcohol / drug use?: No history of alcohol / drug abuse                         ASAM's:  Six Dimensions of Multidimensional Assessment  Dimension 1:  Acute Intoxication and/or Withdrawal Potential:      Dimension 2:  Biomedical Conditions and Complications:      Dimension 3:  Emotional, Behavioral, or Cognitive Conditions and Complications:     Dimension 4:  Readiness to Change:     Dimension 5:  Relapse, Continued use, or Continued Problem Potential:     Dimension 6:  Recovery/Living Environment:     ASAM Severity Score:    ASAM Recommended Level of Treatment:     Substance use Disorder (SUD)    Recommendations for Services/Supports/Treatments:    DSM5 Diagnoses: Patient Active Problem List   Diagnosis Date Noted  . Autism spectrum disorder 12/29/2020  . Bipolar 1 disorder (HCC) 12/29/2020    Disposition: Per Marciano Sequin, NP, pt  is recommended for inpatient treatment.   Dahlia Nifong Shirlee More, [facility]

## 2021-03-02 NOTE — ED Notes (Signed)
Patient's belongings placed in locker 14

## 2021-03-02 NOTE — BH Assessment (Signed)
Pt's nurse, Delorise Shiner RN, returned clinician's phone call, stating the Tele-Assessment machine is in pt's room and pt is ready to be seen. Explained that 0630 is the cut-off for assessments to be completed, so a note would be left for the incoming TTS staff to inform them that pt is ready to be seen.

## 2021-03-02 NOTE — BH Assessment (Signed)
Clinician attempted to make contact with patient's nurse, Landry Corporal, in an effort to complete pt's mental health assessment. Clinician called the ED at 0435 and was eventually transferred to a different nurse who stated pt's nurse was currently working with another patient and would return my call when she had the opportunity.

## 2021-03-02 NOTE — ED Notes (Signed)
Patient continues to pace the floor speaking about a chip in his head and hearing voices to kill himself; pt denies wanting to hurt  Him self but states the voice are telling him too. Pt asked if he was Voluntary and if he could sign himself out. Pt states he does not want to be IVC'd but he can not go back into the hospital. PRNs have been administered already....EDP notified-Monique,RN

## 2021-03-02 NOTE — ED Notes (Signed)
Pt becoming increasingly agitated, pacing around nurses station

## 2021-03-02 NOTE — BH Assessment (Signed)
Patient to be considered for admission to [facility] pending collateral information from mother Danny Yackley [phone]). Collateral information to determine if patient will be able to program effectively on the [facility] unit.   Upon chart review on 3/3/20221, patient did not consent for TTS to contact his mother for collateral information. "Patient is guarded about his mental health history and denies history of inpatient treatment. Per chart review patient was assessed on 12/29/20 and recommended for inpatient treatment due to similar symptoms."  Patient continues to decline staff to contact mother for collateral information. [facility] AC provided updates. He continues to meet criteria for INPT Psychiatric treatment. Requesting AC to consider for bed placement at [facility].

## 2021-03-02 NOTE — ED Notes (Signed)
Patient approaching staff members and visitors stating "You want me to kill myself"

## 2021-03-02 NOTE — ED Notes (Signed)
Message sent to [facility] Provider Laveda Norman asking him to review results of Head CT done today.

## 2021-03-03 DIAGNOSIS — F250 Schizoaffective disorder, bipolar type: Secondary | ICD-10-CM | POA: Diagnosis not present

## 2021-03-03 DIAGNOSIS — F22 Delusional disorders: Secondary | ICD-10-CM | POA: Diagnosis present

## 2021-03-03 NOTE — BH Assessment (Addendum)
Patient continues to meet inpatient treatment, per [facility].   Disposition Counselor followed up with [facility] regarding placement at [facility] for psychiatric stabilization. Informed by [facility] (Tosin, RN) that patient will not be a candidate for [facility] due to inability to program on the unit.    Meanwhile, Disposition Counselor re-faxed patient to the following facilities as advised by the [facility] Debbe Bales, RN) on 03/02/2021.   [facility]      [facility] Details     [facility] Details     [facility] Details     [facility] Details     [facility] Details     [facility] Details     [facility] Details     [facility] Details     [facility] Details     [facility]     [facility] Details     [facility] Details     [facility] Details     [facility]

## 2021-03-03 NOTE — ED Provider Notes (Signed)
Patient medically cleared since 03/02/21 at 0600     Margarita Grizzle, MD 03/03/21 1456

## 2021-03-03 NOTE — ED Notes (Signed)
Patient care assumed by this RN. Patient up to the nursing station desk several times during the shift change to ask for update on his care and to speak to his doctor. He is wanting the chip out of his head. Patient assured that the team is working on his placement for treatment and he would be updated today. Patient noted to be pacing very quickly in his room to the door and back to the back wall. Patient remaining in his personal space. Concerns for patient safety shared with TTS team member due to patient's voluntary admission status. Will monitor safety.

## 2021-03-03 NOTE — BH Assessment (Addendum)
Received a call from Bria at [facility]. Patient has been accepted to [facility] for admission 03/04/2021 after 7am shift change. The accepting provider is Dr. Traci Sermon. Nursing report [phone]. Nursing at [facility] Greig Castilla, RN), provided updates.

## 2021-03-03 NOTE — ED Notes (Signed)
Patient is resting comfortably on hospital bed. No s/sx of agitation or distress noted. Sitter with line of sight to patient at all times. Lights in room dimmed. Door open to hall.

## 2021-03-03 NOTE — ED Notes (Signed)
   Around 1945 patient became agitated and pacing around in his room.  Stated that he had some trauma in his life that he did not tell the counselor and would like to speak with them again.  I told the patient I was not sure if this was possible but I would reach out the [facility].  Called [facility] and spoke to the [facility] because I was unable to reach a counselor from the provided numbers.  AC stated he was unable to speak with a counselor at this time because they were busy and that he had already spoken with multiple counselors today.  I was unaware of this and apologized.  She stated that he would be reassessed in the morning by the counselor.    Patient was notified and is ok with waiting to speak with a counselor.  Patient started talking with me about his past issues and asking if I had access to all of his records.  Patient was requesting to see his CT scan to prove there wasn't a microchip in his head.  Patient relentless with the thought that he was misdiagnosed with bipolar and convinced he has DID instead.  I told the patient I was unable to change his diagnosis or add an additional diagnosis, but I would make sure someone was aware that he felt this way.  Patient continuing to pace around the room but less agitated.

## 2021-03-03 NOTE — Consult Note (Signed)
Telepsych Consultation   Reason for Consult:  Psych consult Referring Physician:  Quincy Carnes, PA-C Location of Patient: [facility] 423T Location of Provider: [place] Department  Patient Identification: Jordi Lacko MRN:  [id number] Principal Diagnosis: Delusions [facility]) Diagnosis:  Principal Problem:   Delusions ([place]) Active Problems:   Autism spectrum disorder   Bipolar 1 disorder ([place])  Total Time spent with patient: 30 minutes  Subjective:   Sims Laday is a 25 y.o. male patient admitted with "headache", delusions.  Patient presents sitting on side of the bed; calm and cooperative, fair eye contact, even mood. Patient remains delusional, paranoid, and endorsing auditory and visual hallucinations. States name as Hasani Diemer. Date: "It should be the 2nd or 3rd of March. 2022. I'm at [facility]. I'm in the Emergency Ward. The psychiatric ward. Patient states his reason for admission was, "I was talking about a chip in my head and people reading my thoughts".   "This guy Rosanne Sack wants me to kill myself. A lot of people want me to kill myself because they can see my thoughts because of the chip in my head. It's like maladaptive daydreaming. I believe I've been misdiagnosed. People misread my thoughts due to the chip in my brain and they can read my thoughts and tell me things I don't. It's like people are trying to push me to suicide or something".   Patient states he currently lives in a home alone in [place]. States he met "Fritz Pickerel" at a mental hospital further stating the last thing Fritz Pickerel told him was to "pull the trigger".  Patient mentions his mother who resides locally as close family support. "She's not really helpful. She wants me to believe I have bipolar when I'm just really maladaptively daydreaming. 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 I believe I have some trauma or something that no one knows  about". Patient declined provider contact his mother for collateral information. Patient requesting to see brain scan to make sure there is no chip in his head.   Patient denies any current suicidal or homicidal ideations, auditory or visual hallucinations stating, "I don't hear voices I just maladaptive daydreaming". 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    HPI:   Azariah Bonura is a 25 year old male patient with history of autism, bipolar disorder who initially presented to the [facility] for "help" after being triaged for headaches. Patient presented delusional with ideas of reference regarding a chip in his head and several delusions of grandeur including potentially becoming MMA fighter and "having a beer" with Vladamir Putin. Patient reports not taking psychiatric medications and poor sleep. Patient continues to endorse suicidal ideations related to the auditory hallucinations.   Past Psychiatric History:  Autism Bipolar disorder  Risk to Self:  yes Risk to Others:  yes Prior Inpatient Therapy:  yes Prior Outpatient Therapy:  yes  Past Medical History:  Past Medical History:  Diagnosis Date  . Wolff-Parkinson-White syndrome    History reviewed. No pertinent surgical history. Family History: No family history on file. Family Psychiatric  History: not noted Social History:  Social History   Substance and Sexual Activity  Alcohol Use Never     Social History   Substance and Sexual Activity  Drug Use Never    Social History   Socioeconomic History  .  Marital status: Unknown    Spouse name: Not on file  . Number of children: Not on file  . Years of education: Not on file  . Highest education level: Not on file  Occupational History  . Not on file  Tobacco Use  . Smoking status: Never Smoker  . Smokeless tobacco: Never Used   Substance and Sexual Activity  . Alcohol use: Never  . Drug use: Never  . Sexual activity: Not on file  Other Topics Concern  . Not on file  Social History Narrative  . Not on file   Social Determinants of Health   Financial Resource Strain: Not on file  Food Insecurity: Not on file  Transportation Needs: Not on file  Physical Activity: Not on file  Stress: Not on file  Social Connections: Not on file   Additional Social History:   Allergies:   Allergies  Allergen Reactions  . Augmentin [Amoxicillin-Pot Clavulanate] Rash    Labs:  Results for orders placed or performed during the hospital encounter of 03/01/21 (from the past 48 hour(s))  CBC with Differential     Status: None   Collection Time: 03/02/21  2:31 AM  Result Value Ref Range   WBC 8.4 4.0 - 10.5 K/uL   RBC 4.95 4.22 - 5.81 MIL/uL   Hemoglobin 15.6 13.0 - 17.0 g/dL   HCT 45.7 39.0 - 52.0 %   MCV 92.3 80.0 - 100.0 fL   MCH 31.5 26.0 - 34.0 pg   MCHC 34.1 30.0 - 36.0 g/dL   RDW 12.9 11.5 - 15.5 %   Platelets 258 150 - 400 K/uL   nRBC 0.0 0.0 - 0.2 %   Neutrophils Relative % 79 %   Neutro Abs 6.6 1.7 - 7.7 K/uL   Lymphocytes Relative 13 %   Lymphs Abs 1.1 0.7 - 4.0 K/uL   Monocytes Relative 7 %   Monocytes Absolute 0.6 0.1 - 1.0 K/uL   Eosinophils Relative 0 %   Eosinophils Absolute 0.0 0.0 - 0.5 K/uL   Basophils Relative 1 %   Basophils Absolute 0.1 0.0 - 0.1 K/uL   Immature Granulocytes 0 %   Abs Immature Granulocytes 0.03 0.00 - 0.07 K/uL    Comment: Performed at [facility], [address] [place], [place] [address]  Comprehensive metabolic panel     Status: Abnormal   Collection Time: 03/02/21  2:31 AM  Result Value Ref Range   Sodium 139 135 - 145 mmol/L   Potassium 3.4 (L) 3.5 - 5.1 mmol/L   Chloride 104 98 - 111 mmol/L   CO2 23 22 - 32 mmol/L   Glucose, Bld 122 (H) 70 - 99 mg/dL    Comment: Glucose reference range applies only to samples taken after fasting for at least 8 hours.    BUN 12 6 - 20 mg/dL   Creatinine, Ser 0.88 0.61 - 1.24 mg/dL   Calcium 9.6 8.9 - 10.3 mg/dL   Total Protein 7.1 6.5 - 8.1 g/dL   Albumin 4.7 3.5 - 5.0 g/dL   AST 33 15 - 41 U/L   ALT 33 0 - 44 U/L   Alkaline Phosphatase 58 38 - 126 U/L   Total Bilirubin 3.1 (H) 0.3 - 1.2 mg/dL   GFR, Estimated >60 >60 mL/min    Comment: (NOTE) Calculated using the CKD-EPI Creatinine Equation (2021)    Anion gap 12 5 - 15    Comment: Performed at [facility]  [address], [place], [place] [address]  Ethanol     Status: None   Collection Time: 03/02/21  2:31 AM  Result Value Ref Range   Alcohol, Ethyl (B) <10 <10 mg/dL    Comment: (NOTE) Lowest detectable limit for serum alcohol is 10 mg/dL.  For medical purposes only. Performed at [facility], [place] [address] [place], [place] [address]   Salicylate level     Status: Abnormal   Collection Time: 03/02/21  2:31 AM  Result Value Ref Range   Salicylate Lvl <2.6 (L) 7.0 - 30.0 mg/dL    Comment: Performed at [facility] [address], [place], [place] [address]  Acetaminophen level     Status: Abnormal   Collection Time: 03/02/21  2:31 AM  Result Value Ref Range   Acetaminophen (Tylenol), Serum <10 (L) 10 - 30 ug/mL    Comment: (NOTE) Therapeutic concentrations vary significantly. A range of 10-30 ug/mL  may be an effective concentration for many patients. However, some  are best treated at concentrations outside of this range. Acetaminophen concentrations >150 ug/mL at 4 hours after ingestion  and >50 ug/mL at 12 hours after ingestion are often associated with  toxic reactions.  Performed at [facility], [place] [address], [place], [place] [address]   Resp Panel by RT-PCR (Flu A&B, Covid) Nasopharyngeal Swab     Status: None   Collection Time: 03/02/21  2:31 AM   Specimen: Nasopharyngeal Swab; Nasopharyngeal(NP) swabs in vial transport medium  Result Value Ref Range   SARS Coronavirus 2 by RT PCR NEGATIVE  NEGATIVE    Comment: (NOTE) SARS-CoV-2 target nucleic acids are NOT DETECTED.  The SARS-CoV-2 RNA is generally detectable in upper respiratory specimens during the acute phase of infection. The lowest concentration of SARS-CoV-2 viral copies this assay can detect is 138 copies/mL. A negative result does not preclude SARS-Cov-2 infection and should not be used as the sole basis for treatment or other patient management decisions. A negative result may occur with  improper specimen collection/handling, submission of specimen other than nasopharyngeal swab, presence of viral mutation(s) within the areas targeted by this assay, and inadequate number of viral copies(<138 copies/mL). A negative result must be combined with clinical observations, patient history, and epidemiological information. The expected result is Negative.  Fact Sheet for Patients:  EntrepreneurPulse.com.au  Fact Sheet for Healthcare Providers:  IncredibleEmployment.be  This test is no t yet approved or cleared by the [place] FDA and  has been authorized for detection and/or diagnosis of SARS-CoV-2 by FDA under an Emergency Use Authorization (EUA). This EUA will remain  in effect (meaning this test can be used) for the duration of the COVID-19 declaration under Section 564(b)(1) of the Act, 21 U.S.C.section 360bbb-3(b)(1), unless the authorization is terminated  or revoked sooner.       Influenza A by PCR NEGATIVE NEGATIVE   Influenza B by PCR NEGATIVE NEGATIVE    Comment: (NOTE) The Xpert Xpress SARS-CoV-2/FLU/RSV plus assay is intended as an aid in the diagnosis of influenza from Nasopharyngeal swab specimens and should not be used as a sole basis for treatment. Nasal washings and aspirates are unacceptable for Xpert Xpress SARS-CoV-2/FLU/RSV testing.  Fact Sheet for Patients: EntrepreneurPulse.com.au  Fact Sheet for Healthcare  Providers: IncredibleEmployment.be  This test is not yet approved or cleared by the [place] FDA and has been authorized for detection and/or diagnosis of SARS-CoV-2 by FDA under an Emergency Use Authorization (EUA). This EUA will remain in  effect (meaning this test can be used) for the duration of the COVID-19 declaration under Section 564(b)(1) of the Act, 21 U.S.C. section 360bbb-3(b)(1), unless the authorization is terminated or revoked.  Performed at [facility], [place] [address] [place], [place] [address]   Rapid urine drug screen (hospital performed)     Status: None   Collection Time: 03/02/21  5:20 PM  Result Value Ref Range   Opiates NONE DETECTED NONE DETECTED   Cocaine NONE DETECTED NONE DETECTED   Benzodiazepines NONE DETECTED NONE DETECTED   Amphetamines NONE DETECTED NONE DETECTED   Tetrahydrocannabinol NONE DETECTED NONE DETECTED   Barbiturates NONE DETECTED NONE DETECTED    Comment: (NOTE) DRUG SCREEN FOR MEDICAL PURPOSES ONLY.  IF CONFIRMATION IS NEEDED FOR ANY PURPOSE, NOTIFY LAB WITHIN 5 DAYS.  LOWEST DETECTABLE LIMITS FOR URINE DRUG SCREEN Drug Class                     Cutoff (ng/mL) Amphetamine and metabolites    1000 Barbiturate and metabolites    200 Benzodiazepine                 253 Tricyclics and metabolites     300 Opiates and metabolites        300 Cocaine and metabolites        300 THC                            50 Performed at [facility], [place] [address] [place], [place] [address]     Medications:  Current Facility-Administered Medications  Medication Dose Route Frequency Provider Last Rate Last Admin  . benztropine (COGENTIN) tablet 1 mg  1 mg Oral QHS Connye Burkitt, NP   1 mg at 03/02/21 2151  . divalproex (DEPAKOTE) DR tablet 500 mg  500 mg Oral Q12H Connye Burkitt, NP   500 mg at 03/03/21 1003  . risperiDONE (RISPERDAL M-TABS) disintegrating tablet 2 mg  2 mg Oral Q8H PRN Connye Burkitt, NP   2  mg at 03/03/21 1004   And  . LORazepam (ATIVAN) tablet 1 mg  1 mg Oral PRN Connye Burkitt, NP       And  . ziprasidone (GEODON) injection 20 mg  20 mg Intramuscular PRN Connye Burkitt, NP      . OLANZapine zydis (ZYPREXA) disintegrating tablet 10 mg  10 mg Oral QHS Connye Burkitt, NP   10 mg at 03/02/21 2151  . OLANZapine zydis (ZYPREXA) disintegrating tablet 5 mg  5 mg Oral q morning Connye Burkitt, NP   5 mg at 03/03/21 1004   No current outpatient medications on file.   Musculoskeletal: Strength & Muscle Tone: within normal limits Gait & Station: normal Patient leans: N/A  Psychiatric Specialty Exam: Physical Exam Psychiatric:        Attention and Perception: He perceives auditory and visual hallucinations.        Mood and Affect: Affect is flat.        Speech: Speech is tangential.        Behavior: Behavior is withdrawn.        Thought Content: Thought content is paranoid and delusional.        Judgment: Judgment is impulsive and inappropriate.     Review of Systems  Psychiatric/Behavioral: Positive for hallucinations.    Blood pressure 110/69, pulse 77, temperature 97.9 F (36.6 C),  temperature source Oral, resp. rate 15, height 6' (1.829 m), weight 82 kg, SpO2 99 %.Body mass index is 24.52 kg/m.  General Appearance: Casual  Eye Contact:  Fair  Speech:  Clear and Coherent  Volume:  Normal  Mood:  Euthymic  Affect:  Non-Congruent and Flat  Thought Process:  Disorganized and Descriptions of Associations: Loose  Orientation:  Other:  person, place, situation  Thought Content:  Illogical, Delusions, Hallucinations: Auditory Command:  suicidal ideations Visual, Ideas of Reference:   Paranoia Delusions, Paranoid Ideation, Rumination and Tangential  Suicidal Thoughts:  Yes.  without intent/plan  Homicidal Thoughts:  No  Memory:  Immediate;   Poor Recent;   Fair  Judgement:  Fair  Insight:  Fair  Psychomotor Activity:  Normal  Concentration:  Concentration: Fair and  Attention Span: Fair  Recall:  [employer] of Knowledge:  Fair  Language:  Fair  Akathisia:  NA  Handed:  Left  AIMS (if indicated):     Assets:  Agricultural consultant Housing Physical Health Resilience Social Support  ADL's:  Intact  Cognition:  Impaired,  Mild  Sleep:      Treatment Plan Summary: Daily contact with patient to assess and evaluate symptoms and progress in treatment, Medication management and Plan to admit patient to psychiatric unit for further observation and stabilization.   Disposition: Recommend psychiatric Inpatient admission when medically cleared. Supportive therapy provided about ongoing stressors.  This service was provided via telemedicine using a 2-way, interactive audio and video technology.  Names of all persons participating in this telemedicine service and their role in this encounter. Name: Oneida Alar Role: PMHNP  Name: Pankow Abbot Role: Attending MD  Name: Wenda Low Role: Patient  Name:  Role:     Inda Merlin, NP 03/03/2021 11:34 AM

## 2021-03-03 NOTE — ED Provider Notes (Signed)
Emergency Medicine Observation Re-evaluation Note  Miguel Shea is a 25 y.o. male, seen on rounds today.  Pt initially presented to the ED for complaints of Headache Currently, the patient is resting currently with no complaints.  Home meds have been ordered, patient is placed under IVC as he continues to endorse hallucinations stating that "he needs to stop Putin and that there is a chip in his head".  Denies homicidal or suicidal ideations.  Currently awaiting bed placement at this time  Physical Exam  BP 110/69 (BP Location: Right Arm)   Pulse 77   Temp 97.9 F (36.6 C) (Oral)   Resp 15   Ht 6' (1.829 m)   Wt 82 kg   SpO2 99%   BMI 24.52 kg/m  Physical Exam General: No distress Lungs: Nonlabored breathing Psych: Hallucinations, no suicidal or homicidal ideations  ED Course / MDM  EKG:    I have reviewed the labs performed to date as well as medications administered while in observation. no Recent changes in the last 24 hours.  Plan  Current plan is for await for bed placement for patient as he meets inpatient psych criteria. Patient is under full IVC at this time.   Carroll Sage, PA-C 03/03/21 1016    Terrilee Files, MD 03/03/21 1911

## 2021-03-03 NOTE — ED Notes (Signed)
Lunch Tray Ordered @ 1034. 

## 2021-03-04 ENCOUNTER — Encounter (HOSPITAL_COMMUNITY): Payer: Self-pay | Admitting: *Deleted

## 2021-03-04 DIAGNOSIS — F250 Schizoaffective disorder, bipolar type: Secondary | ICD-10-CM | POA: Diagnosis not present

## 2021-03-04 NOTE — ED Notes (Signed)
Called [facility] for transport to [place].

## 2021-03-04 NOTE — ED Notes (Signed)
Breakfast Ordered 

## 2021-03-04 NOTE — ED Notes (Signed)
PT pacing in and out of room, though redirectable and pleasant.  Notified that he is being transferred out today.  Pt states he feels quite anxious.  Will give ativan.

## 2021-03-04 NOTE — ED Notes (Signed)
[facility] [employer] transport at nurses station

## 2021-07-12 DIAGNOSIS — E669 Obesity, unspecified: Secondary | ICD-10-CM | POA: Insufficient documentation

## 2021-07-12 DIAGNOSIS — J309 Allergic rhinitis, unspecified: Secondary | ICD-10-CM | POA: Insufficient documentation

## 2021-07-12 DIAGNOSIS — F419 Anxiety disorder, unspecified: Secondary | ICD-10-CM | POA: Insufficient documentation

## 2021-07-12 DIAGNOSIS — F17200 Nicotine dependence, unspecified, uncomplicated: Secondary | ICD-10-CM | POA: Insufficient documentation

## 2021-09-04 IMAGING — CT CT HEAD W/O CM
3 series · 14 of 47 positions shown, 16 images · non-contrast
Comparison: No pertinent prior exams available for comparison.

CLINICAL DATA: Mental status change, unknown cause; acute
behavioral disturbance initial evaluation. Altered mental status.

EXAM:
CT HEAD WITHOUT CONTRAST
TECHNIQUE: Contiguous axial images were obtained from the base of the skull
through the vertex without intravenous contrast.

[Series 3: head 5.0 h30s · axial · 0.45mm/px · z∈[-89,+36]mm · 8 of 31 slices shown, 10 images]
[im 3/31  brain]
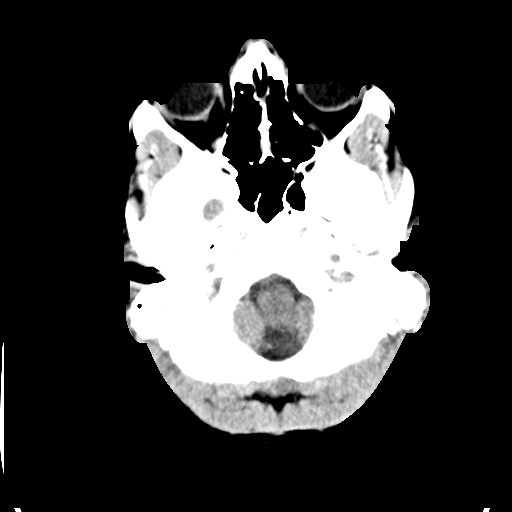
[im 3/31  bone]
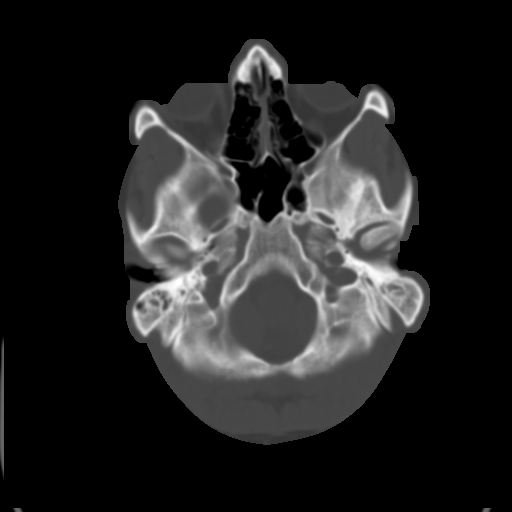
[im 7/31  brain]
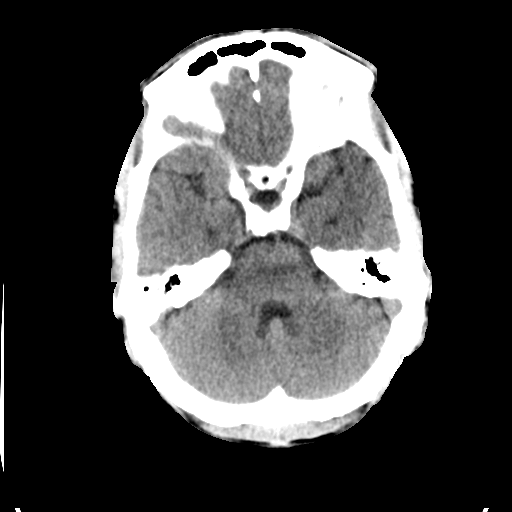
[im 10/31  brain]
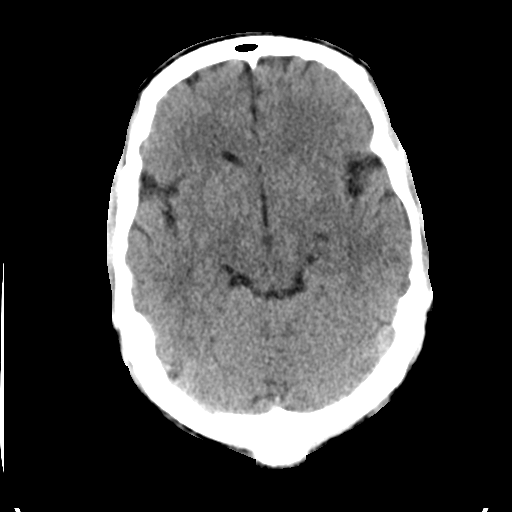
[im 14/31  brain]
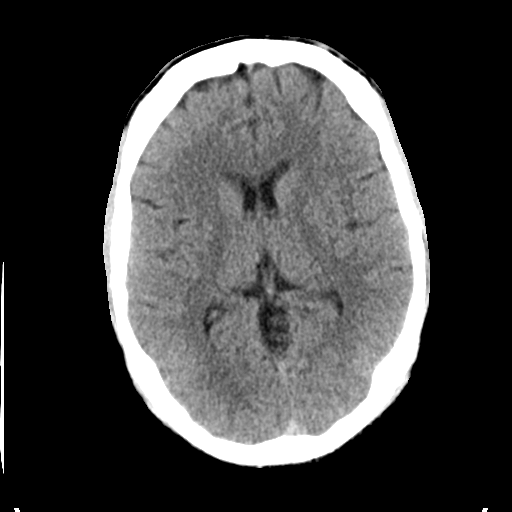
[im 17/31  brain]
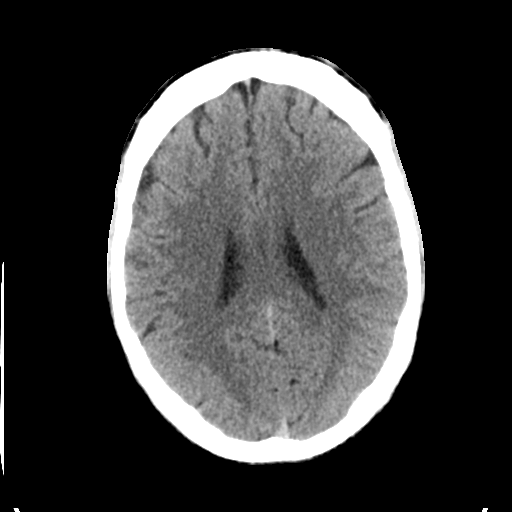
[im 17/31  bone]
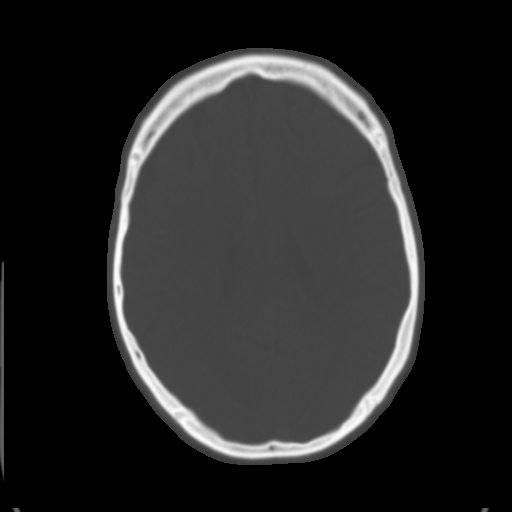
[im 21/31  brain]
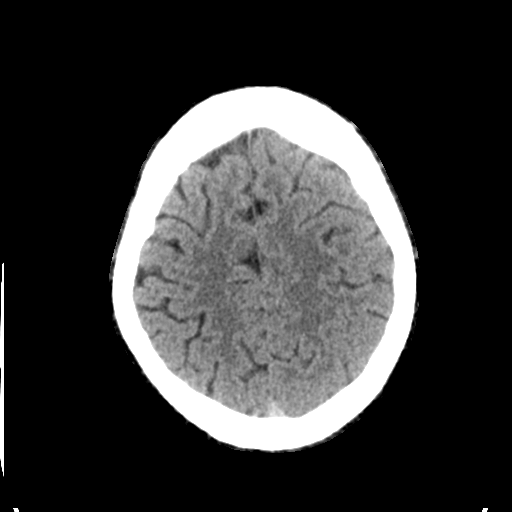
[im 24/31  brain]
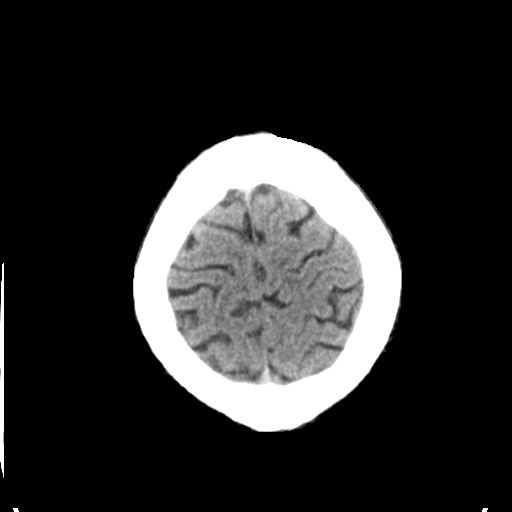
[im 28/31  brain]
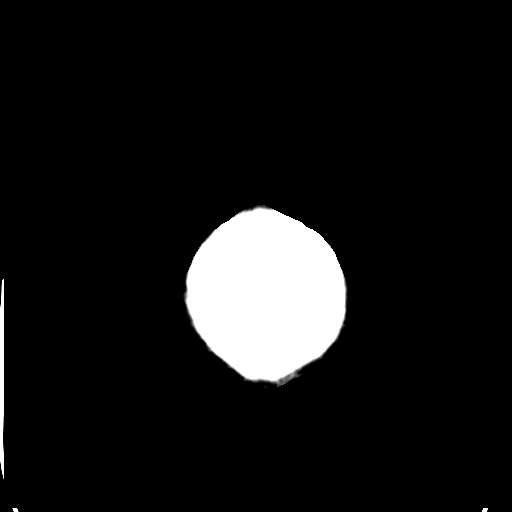

[Series 5: head 3.0 mpr cor · coronal · 0.31mm/px · 3 of 71 slices shown]
[im 24/71  brain]
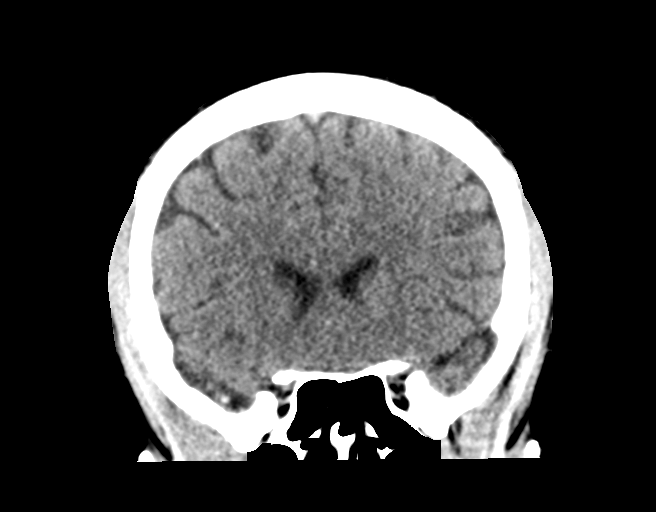
[im 32/71  brain]
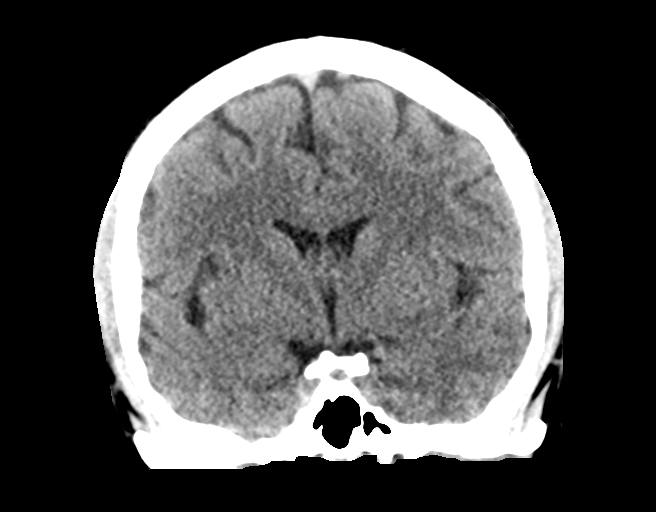
[im 39/71  brain]
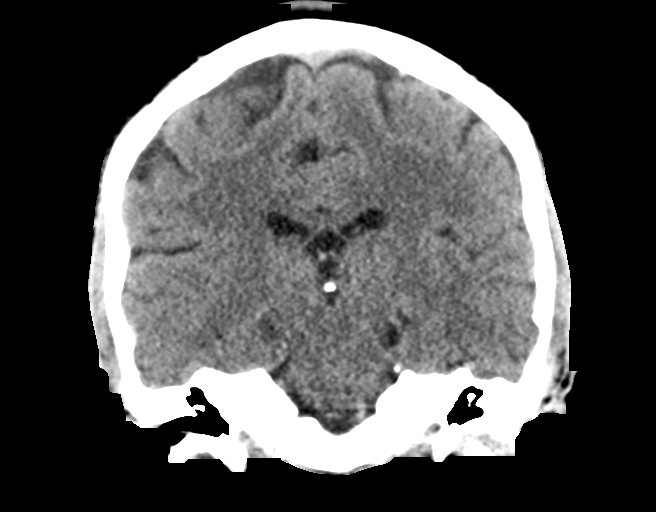

[Series 6: head 3.0 mpr sag · sagittal · 0.31mm/px · 3 of 54 slices shown]
[im 18/54  brain]
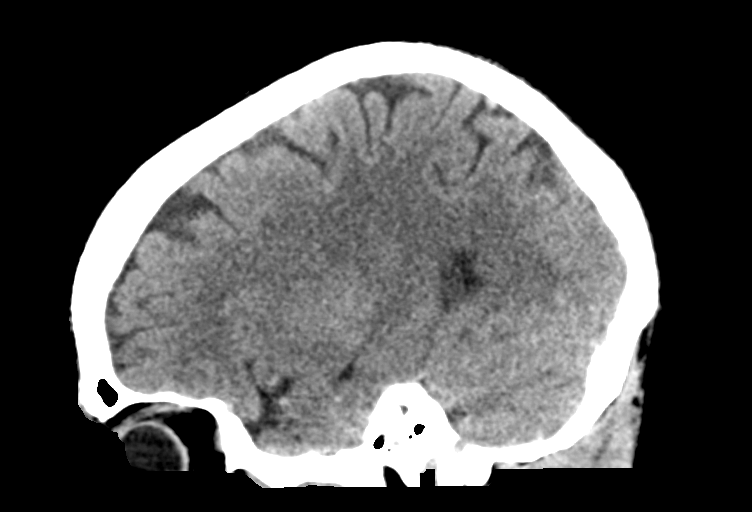
[im 27/54  brain]
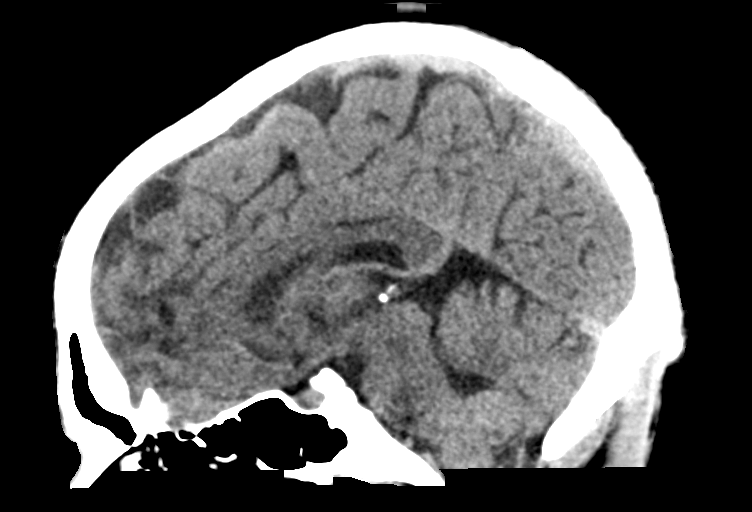
[im 36/54  brain]
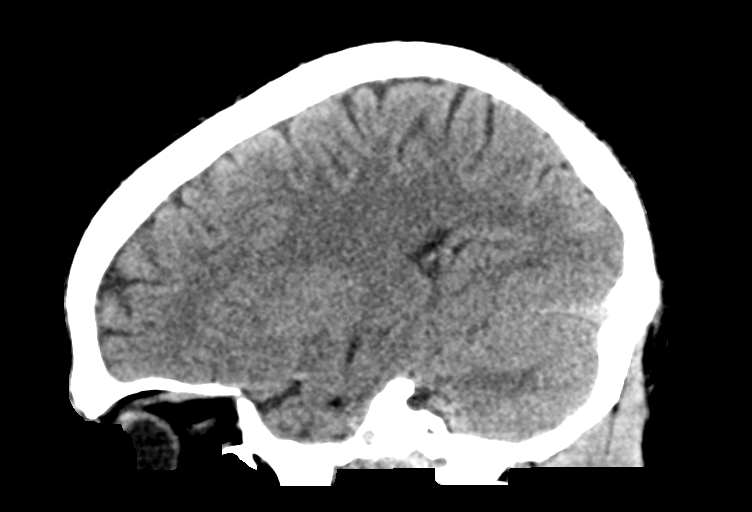

[14 of 47 positions shown; findings below may reference images not displayed]

FINDINGS: Brain:

Cerebral volume is normal.

There is no acute intracranial hemorrhage.

No demarcated cortical infarct.

No extra-axial fluid collection.

No evidence of intracranial mass.

No midline shift.

Vascular: No hyperdense vessel.

Skull: Normal. Negative for fracture or focal lesion.

Sinuses/Orbits: Visualized orbits show no acute finding. Trace
ethmoid sinus mucosal thickening.

Other: Left mastoid effusion. Debris within the external auditory
canals bilaterally.
IMPRESSION: No evidence of acute intracranial abnormality.

Left mastoid effusion.

## 2021-10-16 ENCOUNTER — Encounter (HOSPITAL_COMMUNITY): Payer: Self-pay | Admitting: *Deleted

## 2021-10-16 ENCOUNTER — Emergency Department (HOSPITAL_COMMUNITY)
Admission: EM | Admit: 2021-10-16 | Discharge: 2021-10-16 | Disposition: A | Discharge status: Home / Self Care | Payer: Medicare Other | Attending: Emergency Medicine | Admitting: Emergency Medicine

## 2021-10-16 ENCOUNTER — Emergency Department (HOSPITAL_COMMUNITY): Payer: Medicare Other

## 2021-10-16 ENCOUNTER — Encounter (HOSPITAL_COMMUNITY): Payer: Self-pay | Admitting: Emergency Medicine

## 2021-10-16 DIAGNOSIS — S51811A Laceration without foreign body of right forearm, initial encounter: Secondary | ICD-10-CM

## 2021-10-16 DIAGNOSIS — S6991XA Unspecified injury of right wrist, hand and finger(s), initial encounter: Secondary | ICD-10-CM | POA: Diagnosis present

## 2021-10-16 DIAGNOSIS — W25XXXA Contact with sharp glass, initial encounter: Secondary | ICD-10-CM | POA: Diagnosis not present

## 2021-10-16 DIAGNOSIS — S61511A Laceration without foreign body of right wrist, initial encounter: Secondary | ICD-10-CM | POA: Insufficient documentation

## 2021-10-16 DIAGNOSIS — Y900 Blood alcohol level of less than 20 mg/100 ml: Secondary | ICD-10-CM | POA: Diagnosis not present

## 2021-10-16 DIAGNOSIS — Z23 Encounter for immunization: Secondary | ICD-10-CM | POA: Insufficient documentation

## 2021-10-16 DIAGNOSIS — S61216A Laceration without foreign body of right little finger without damage to nail, initial encounter: Secondary | ICD-10-CM | POA: Diagnosis not present

## 2021-10-16 LAB — COMPREHENSIVE METABOLIC PANEL
ALT: 19 U/L (ref 0–44)
AST: 22 U/L (ref 15–41)
Albumin: 3.9 g/dL (ref 3.5–5.0)
Alkaline Phosphatase: 57 U/L (ref 38–126)
Anion gap: 8 (ref 5–15)
BUN: 13 mg/dL (ref 6–20)
CO2: 23 mmol/L (ref 22–32)
Calcium: 8.8 mg/dL — ABNORMAL LOW (ref 8.9–10.3)
Chloride: 104 mmol/L (ref 98–111)
Creatinine, Ser: 0.8 mg/dL (ref 0.61–1.24)
GFR, Estimated: >60 mL/min (ref >60)
Glucose, Bld: 126 mg/dL — ABNORMAL HIGH (ref 70–99)
Potassium: 3.5 mmol/L (ref 3.5–5.1)
Sodium: 135 mmol/L (ref 135–145)
Total Bilirubin: 2.2 mg/dL — ABNORMAL HIGH (ref 0.3–1.2)
Total Protein: 6.1 g/dL — ABNORMAL LOW (ref 6.5–8.1)

## 2021-10-16 LAB — CBC
HCT: 41.4 % (ref 39.0–52.0)
Hemoglobin: 14.3 g/dL (ref 13.0–17.0)
MCH: 31 pg (ref 26.0–34.0)
MCHC: 34.5 g/dL (ref 30.0–36.0)
MCV: 89.8 fL (ref 80.0–100.0)
Platelets: 225 10*3/uL (ref 150–400)
RBC: 4.61 MIL/uL (ref 4.22–5.81)
RDW: 12.1 % (ref 11.5–15.5)
WBC: 16.7 10*3/uL — ABNORMAL HIGH (ref 4.0–10.5)
nRBC: 0 % (ref 0.0–0.2)

## 2021-10-16 LAB — ETHANOL: Alcohol, Ethyl (B): <10 mg/dL (ref <10)

## 2021-10-16 MED ORDER — CEFAZOLIN SODIUM-DEXTROSE 2-4 GM/100ML-% IV SOLN
2.0000 g | Freq: Once | INTRAVENOUS | Status: AC
  Administered 2021-10-16: 2 g via INTRAVENOUS
  Filled 2021-10-16: qty 100

## 2021-10-16 MED ORDER — TETANUS-DIPHTH-ACELL PERTUSSIS 5-2.5-18.5 LF-MCG/0.5 IM SUSY
0.5000 mL | PREFILLED_SYRINGE | Freq: Once | INTRAMUSCULAR | Status: AC
  Administered 2021-10-16: 0.5 mL via INTRAMUSCULAR
  Filled 2021-10-16: qty 0.5

## 2021-10-16 MED ORDER — CEPHALEXIN 500 MG PO CAPS: 500.0000 mg | ORAL_CAPSULE | Freq: Three times a day (TID) | ORAL | 0 refills | Status: AC

## 2021-10-16 MED ORDER — HYDROMORPHONE HCL 1 MG/ML IJ SOLN
1.0000 mg | Freq: Once | INTRAMUSCULAR | Status: AC
  Administered 2021-10-16: 1 mg via INTRAVENOUS

## 2021-10-16 MED ORDER — SODIUM CHLORIDE 0.9 % IV BOLUS
1000.0000 mL | Freq: Once | INTRAVENOUS | Status: AC
  Administered 2021-10-16: 1000 mL via INTRAVENOUS

## 2021-10-16 NOTE — ED Notes (Addendum)
Orthopedic specialist at pt bedside. Distal tourniquet removed by orthopedist & wound bandaged and ace-wrapped by orthopedist and [place], RN.

## 2021-10-16 NOTE — ED Notes (Signed)
Provider removed tourniquet 1 reapplied at 0237

## 2021-10-16 NOTE — ED Notes (Signed)
RN called orthopedic technician for pt's arm splint

## 2021-10-16 NOTE — Discharge Instructions (Addendum)
You were evaluated in the Emergency Department and after careful evaluation, we did not find any emergent condition requiring admission or further testing in the hospital.  Your exam/testing today is overall reassuring.  We repaired your lacerations here in the emergency department.  Please take the antibiotics as directed and follow-up with the hand specialist for a repeat evaluation within the next week.  Please return to the Emergency Department if you experience any worsening of your condition.   Thank you for allowing [place] to be a part of your care.

## 2021-10-16 NOTE — ED Notes (Signed)
Per EMS first tourniquet applied at 0159 second tourniquet applied 0219

## 2021-10-16 NOTE — ED Notes (Signed)
Provider removed dressing

## 2021-10-16 NOTE — Progress Notes (Signed)
Orthopedic Tech Progress Note Patient Details:  Miguel Shea [birth year] [id number]  Ortho Devices Type of Ortho Device: Volar splint Ortho Device/Splint Location: URE Ortho Device/Splint Interventions: Ordered, Application   Post Interventions Patient Tolerated: Well Instructions Provided: Adjustment of device, Care of device  [place] A [name] 10/16/2021, 6:04 AM

## 2021-10-16 NOTE — ED Triage Notes (Signed)
BIB [facility] pt punch glass window received a u shaped laceration approximately 4 inc to the rt fa, laceration approximatley 2cm to rt wrist, deep laceration to bone to rt pinky. Dressing applied to area. Tourniquet 1 applied at 0159, tourniquet 2 applied at 0219. PIV 18ga lt AC, fentanyl 

## 2021-10-16 NOTE — Progress Notes (Signed)
Orthopedic Tech Progress Note Patient Details:  Miguel Shea 12/31/1873 [id number]  Patient ID: Miguel Shea, male   DOB: 12/31/1873, 25 y.o.   MRN: [id number] Level II Trauma; not needed at the moment. Darleen Crocker 10/16/2021, 2:54 AM

## 2021-10-16 NOTE — Consult Note (Signed)
Time Called: 3:04 Time Arrived at the Bedside: 3:30  ORTHOPAEDIC CONSULTATION  REQUESTING PHYSICIAN: Sabas Sous, MD  Chief Complaint: right hand lacerations   HPI: Dave Mergen is a 25 y.o. male who punched a glass window prior to arrival.  Lacerations to the dorsal hand including dorsal radial forearm dorsal wrist and dorsal side of the small finger.  He had 2 tourniquets applied 1 proximal 1 distal.  The proximal tourniquet was already released by the ED provider but distal tourniquet had been maintained.  Following removal of both tourniquets patient notes numbness on the dorsal side of the.  No past medical history on file.  Social History   Socioeconomic History   Marital status: Single    Spouse name: Not on file   Number of children: Not on file   Years of education: Not on file   Highest education level: Not on file  Occupational History   Not on file  Tobacco Use   Smoking status: Not on file   Smokeless tobacco: Not on file  Substance and Sexual Activity   Alcohol use: Not on file   Drug use: Not on file   Sexual activity: Not on file  Other Topics Concern   Not on file  Social History Narrative   Not on file   Social Determinants of Health   Financial Resource Strain: Not on file  Food Insecurity: Not on file  Transportation Needs: Not on file  Physical Activity: Not on file  Stress: Not on file  Social Connections: Not on file   No family history on file. Not on File   Positive ROS: All other systems have been reviewed and were otherwise negative with the exception of those mentioned in the HPI and as above.  Physical Exam: General: Alert, no acute distress Cardiovascular: No pedal edema Respiratory: No cyanosis, no use of accessory musculature Neurologic: Sensation intact distally Psychiatric: Patient is competent for consent with normal mood and affect Lymphatic: No axillary or cervical lymphadenopathy  MUSCULOSKELETAL:  Multiple  dorsal lacerations to the hand clean in appearance.  Moderate venous oozing wounds when the tourniquet was released.  No gross contamination or foreign bodies noted.  Muscle involvement of the brachial radialis on the dorsal radial side.  Numbness noted to light touch at the dorsal side of the.  Otherwise sensation intact in all fingers.  Active motor AIN, PIN, IO.  Fingers warm and well-perfused with brisk cap refill.      IMAGING: X-rays right hand and forearm show no fracture dislocation or other osseous abnormalities.  No radiopaque foreign bodies.  Assessment: Active Problems:   * No active hospital problems. *   Right hand laceration  Plan: Both tourniquets were removed from the right hand.  No arterial bleeding was noted.  Moderate venous oozing.  Compressive dressing with gauze and Ace wrap were applied.  Plan will be for the ED provider to reassess symmetrically 1 hour later and remains well controlled okay to, loosely closed with simple interrupted sutures.  Clean dressing immobilized with a volar resting splint. If difficult to close at bedside, and plan to reapply gauze compressive dressing and will plan for washout and wound closure in the OR later today.  Joen Laura, MD Cell [phone]

## 2021-10-16 NOTE — ED Provider Notes (Signed)
[facility] DEPT [facility] Emergency Department Provider Note MRN:  [id number]  Arrival date & time: 10/16/21     Chief Complaint   Extremity Laceration   History of Present Illness   Miguel Shea is a 25 y.o. year-old male with no pertinent past medical history presenting to the ED with chief complaint of extremity laceration.  Per report patient punched through a glass window and has multiple lacerations to the right hand, forearm.  Copious bleeding, 2 tourniquets applied by EMS.  No other injuries.  Blood pressure reportedly 80 systolic in route.  Review of Systems  Positive for hand/arm lacerations.  Patient's Health History   History reviewed. No pertinent past medical history.    History reviewed. No pertinent family history.  Social History   Socioeconomic History   Marital status: Single    Spouse name: Not on file   Number of children: Not on file   Years of education: Not on file   Highest education level: Not on file  Occupational History   Not on file  Tobacco Use   Smoking status: Not on file   Smokeless tobacco: Not on file  Substance and Sexual Activity   Alcohol use: Not on file   Drug use: Not on file   Sexual activity: Not on file  Other Topics Concern   Not on file  Social History Narrative   Not on file   Social Determinants of Health   Financial Resource Strain: Not on file  Food Insecurity: Not on file  Transportation Needs: Not on file  Physical Activity: Not on file  Stress: Not on file  Social Connections: Not on file  Intimate Partner Violence: Not on file     Physical Exam   Vitals:   10/16/21 0430 10/16/21 0515  BP: 130/74 121/65  Pulse: 83 81  Resp: 20 (!) 24  Temp:    SpO2: 98% 97%    CONSTITUTIONAL: Well-appearing, NAD NEURO:  Alert and oriented to name, moves all extremities EYES:  eyes equal and reactive ENT/NECK:  no LAD, no JVD CARDIO: Regular rate, well-perfused, normal S1 and S2 PULM:  CTAB no  wheezing or rhonchi GI/GU:  normal bowel sounds, non-distended, non-tender MSK/SPINE:  No gross deformities, no edema SKIN:  no rash, atraumatic PSYCH:  Appropriate speech and behavior  *Additional and/or pertinent findings included in MDM below  Diagnostic and Interventional Summary    EKG Interpretation  Date/Time:    Ventricular Rate:    PR Interval:    QRS Duration:   QT Interval:    QTC Calculation:   R Axis:     Text Interpretation:         Labs Reviewed  CBC - Abnormal; Notable for the following components:      Result Value   WBC 16.7 (*)    All other components within normal limits  COMPREHENSIVE METABOLIC PANEL - Abnormal; Notable for the following components:   Glucose, Bld 126 (*)    Calcium 8.8 (*)    Total Protein 6.1 (*)    Total Bilirubin 2.2 (*)    All other components within normal limits  ETHANOL    DG Hand Complete Right  Final Result    DG Forearm Right  Final Result      Medications  HYDROmorphone (DILAUDID) injection 1 mg (1 mg Intravenous Given 10/16/21 0257)  Tdap (BOOSTRIX) injection 0.5 mL (0.5 mLs Intramuscular Given 10/16/21 0332)  ceFAZolin (ANCEF) IVPB 2g/100 mL premix (0 g Intravenous Stopped 10/16/21  0406)  sodium chloride 0.9 % bolus 1,000 mL (0 mLs Intravenous Stopped 10/16/21 0433)     Procedures  /  Critical Care .Marland KitchenLaceration Repair  Date/Time: 10/16/2021 5:46 AM Performed by: Sabas Sous, MD Authorized by: Sabas Sous, MD   Consent:    Consent obtained:  Verbal   Consent given by:  Patient   Risks, benefits, and alternatives were discussed: yes     Risks discussed:  Infection, need for additional repair, nerve damage, poor wound healing, poor cosmetic result, pain, retained foreign body, tendon damage and vascular damage Universal protocol:    Procedure explained and questions answered to patient or proxy's satisfaction: yes     Patient identity confirmed:  Verbally with patient Anesthesia:    Anesthesia  method:  Local infiltration   Local anesthetic:  Lidocaine 1% WITH epi Laceration details:    Location: Right forearm.   Length (cm):  18   Depth (mm):  5 Pre-procedure details:    Preparation:  Patient was prepped and draped in usual sterile fashion Exploration:    Limited defect created (wound extended): yes     Hemostasis achieved with:  Tourniquet and direct pressure   Imaging outcome: foreign body not noted     Wound exploration: wound explored through full range of motion and entire depth of wound visualized     Wound extent: muscle damage     Contaminated: no   Treatment:    Area cleansed with:  Saline   Amount of cleaning:  Extensive   Debridement:  Minimal Skin repair:    Repair method:  Sutures   Suture size:  4-0   Suture material:  Prolene   Suture technique:  Simple interrupted   Number of sutures:  17 Approximation:    Approximation:  Close Repair type:    Repair type:  Complex Post-procedure details:    Dressing:  Antibiotic ointment and adhesive bandage   Procedure completion:  Tolerated well, no immediate complications .Marland KitchenLaceration Repair  Date/Time: 10/16/2021 5:53 AM Performed by: Sabas Sous, MD Authorized by: Sabas Sous, MD   Consent:    Consent obtained:  Verbal   Consent given by:  Patient   Risks, benefits, and alternatives were discussed: yes     Risks discussed:  Infection, need for additional repair, nerve damage, poor wound healing, poor cosmetic result, pain, retained foreign body, tendon damage and vascular damage Universal protocol:    Procedure explained and questions answered to patient or proxy's satisfaction: yes     Immediately prior to procedure, a time out was called: yes     Patient identity confirmed:  Verbally with patient Anesthesia:    Anesthesia method:  Local infiltration   Local anesthetic:  Lidocaine 1% WITH epi Laceration details:    Location: right wrist.   Length (cm):  6   Depth (mm):  2 Pre-procedure  details:    Preparation:  Patient was prepped and draped in usual sterile fashion Exploration:    Limited defect created (wound extended): no     Hemostasis achieved with:  Direct pressure and tourniquet   Wound exploration: wound explored through full range of motion and entire depth of wound visualized     Contaminated: no   Treatment:    Area cleansed with:  Saline Skin repair:    Repair method:  Sutures   Suture size:  4-0   Suture material:  Prolene   Suture technique:  Simple interrupted   Number of sutures:  6 Approximation:    Approximation:  Close Repair type:    Repair type:  Simple Post-procedure details:    Dressing:  Antibiotic ointment and adhesive bandage   Procedure completion:  Tolerated well, no immediate complications .Marland KitchenLaceration Repair  Date/Time: 10/16/2021 5:55 AM Performed by: Sabas Sous, MD Authorized by: Sabas Sous, MD   Consent:    Consent obtained:  Verbal   Consent given by:  Patient   Risks, benefits, and alternatives were discussed: yes     Risks discussed:  Infection, need for additional repair, nerve damage, tendon damage, retained foreign body, vascular damage, poor wound healing, poor cosmetic result and pain Universal protocol:    Procedure explained and questions answered to patient or proxy's satisfaction: yes     Immediately prior to procedure, a time out was called: yes     Patient identity confirmed:  Verbally with patient Anesthesia:    Anesthesia method:  Local infiltration   Local anesthetic:  Lidocaine 1% WITH epi Laceration details:    Location:  Finger   Finger location:  R small finger   Length (cm):  3   Depth (mm):  9 Pre-procedure details:    Preparation:  Patient was prepped and draped in usual sterile fashion Exploration:    Limited defect created (wound extended): no     Hemostasis achieved with:  Direct pressure and tourniquet   Imaging outcome: foreign body not noted     Wound exploration: wound  explored through full range of motion and entire depth of wound visualized     Contaminated: no   Treatment:    Area cleansed with:  Saline   Amount of cleaning:  Extensive   Debridement:  None   Undermining:  None Skin repair:    Repair method:  Sutures   Suture size:  4-0   Suture material:  Prolene   Suture technique:  Simple interrupted   Number of sutures:  9 Approximation:    Approximation:  Close Repair type:    Repair type:  Simple Post-procedure details:    Dressing:  Antibiotic ointment and adhesive bandage   Procedure completion:  Tolerated well, no immediate complications .Critical Care Performed by: Sabas Sous, MD Authorized by: Sabas Sous, MD   Critical care provider statement:    Critical care time (minutes):  32   Critical care time was exclusive of:  Separately billable procedures and treating other patients   Critical care was necessary to treat or prevent imminent or life-threatening deterioration of the following conditions:  Trauma   Critical care was time spent personally by me on the following activities:  Re-evaluation of patient's condition, ordering and review of radiographic studies, ordering and review of laboratory studies, ordering and performing treatments and interventions, obtaining history from patient or surrogate, examination of patient, discussions with consultants and evaluation of patient's response to treatment  ED Course and Medical Decision Making  I have reviewed the triage vital signs, the nursing notes, and pertinent available records from the EMR.  Listed above are laboratory and imaging tests that I personally ordered, reviewed, and interpreted and then considered in my medical decision making (see below for details).  Deep lacerations to the forearm, right pinky.  Reported heavy bleeding in the field.  Blood pressure 80 systolic in the field, improved to 114/62 here.  Patient is well-perfused and conversant.  Suspect under  the influence of drugs or alcohol.  Proximal tourniquet removed without any return of bleeding.  Distal  tourniquet removed with return of significant bleeding from forearm flap laceration, 2 small wrist lacerations, heavy potentially arterial bleeding from the right pinky laceration.  Tourniquet reapplied at about 2:45 AM.  No signs of any other injuries.  Lidocaine with epinephrine used to anesthetize the areas for closer exam.  Forearm flap laceration has potential muscle belly and tendon involvement, there also seems to be a large severed vein.  This lacerations seem relatively superficial.  Pinky laceration is deep and may involve the lateral digital artery as well as tendon involvement.  X-ray is reassuring.  Injuries discussed with Dr. Blanchie Dessert of orthopedics, who will come to evaluate and manage.     Evaluated by orthopedics, tourniquet taken down and pressure dressing applied.  Plan is to monitor the pressure dressing for an hour to see if this achieves hemostasis.  If continued bleeding, would be appropriate for or evaluation and washout.  On reassessment bleeding is well controlled.  Patient has full flexion and extension abilities of the hand and wrist and therefore the muscle belly involvement of the forearm laceration is of little significance.  Lacerations repaired as described above.  Arm is neurovascularly intact patient is appropriate for discharge with follow-up at the hand clinic.  Elmer Sow. Pilar Plate, MD [facility] Emergency Medicine [facility] [email or web address]  Final Clinical Impressions(s) / ED Diagnoses     ICD-10-CM   1. Laceration of right little finger without foreign body without damage to nail, initial encounter  S61.216A     2. Laceration of right wrist, initial encounter  S61.511A     3. Laceration of right forearm, initial encounter  S51.811A       ED Discharge Orders          Ordered    cephALEXin (KEFLEX) 500 MG capsule  3 times daily         10/16/21 0534             Discharge Instructions Discussed with and Provided to Patient:     Discharge Instructions      You were evaluated in the Emergency Department and after careful evaluation, we did not find any emergent condition requiring admission or further testing in the hospital.  Your exam/testing today is overall reassuring.  We repaired your lacerations here in the emergency department.  Please take the antibiotics as directed and follow-up with the hand specialist for a repeat evaluation within the next week.  Please return to the Emergency Department if you experience any worsening of your condition.   Thank you for allowing [place] to be a part of your care.        Sabas Sous, MD 10/16/21 [phone]

## 2021-10-16 NOTE — ED Notes (Signed)
Proximal tourniquet also removed by orthopedist.

## 2021-10-16 NOTE — ED Notes (Signed)
E-signature pad unavailable at time of pt discharge. This RN discussed discharge materials with pt and answered all pt questions. Pt stated understanding of discharge material. ? ?

## 2021-10-16 NOTE — ED Notes (Signed)
Moved from h22 to Tra A at this time

## 2021-10-16 NOTE — ED Notes (Signed)
EDP at bedside for suture process 

## 2021-10-16 NOTE — ED Notes (Signed)
Verbal report given to Malvin Johns RN at this time

## 2021-10-16 NOTE — ED Notes (Signed)
This RN applied topical antibiotic ointment and non-adhesive pads to pt's sutured wounds and wrapped L forearm wounds with gauze and coban. Orthopedic technician currently at pt bedside for arm splint.

## 2021-10-16 NOTE — ED Notes (Signed)
Provider removed tourniquet number 2

## 2021-10-16 NOTE — ED Notes (Signed)
RN provided a list of multiple taxi phone numbers for pt to call for ride home

## 2021-12-12 ENCOUNTER — Ambulatory Visit (HOSPITAL_COMMUNITY): Payer: Medicare Other | Admitting: Licensed Clinical Social Worker

## 2021-12-21 ENCOUNTER — Ambulatory Visit (HOSPITAL_COMMUNITY): Payer: Medicare Other | Admitting: Licensed Clinical Social Worker

## 2022-01-02 ENCOUNTER — Other Ambulatory Visit: Payer: Self-pay

## 2022-01-02 ENCOUNTER — Inpatient Hospital Stay (HOSPITAL_COMMUNITY)
Admission: RE | Admit: 2022-01-02 | Disposition: A | Discharge status: Still In Hospital | Payer: Medicare Other | Source: Home / Self Care | Attending: Psychiatry | Admitting: Psychiatry

## 2022-01-02 ENCOUNTER — Encounter (HOSPITAL_COMMUNITY): Payer: Self-pay | Admitting: *Deleted

## 2022-01-02 ENCOUNTER — Inpatient Hospital Stay (HOSPITAL_COMMUNITY)
Admission: AD | Admit: 2022-01-02 | Discharge: 2022-01-18 | DRG: 885 | Disposition: A | Discharge status: Home / Self Care | Payer: Medicare Other | Source: Intra-hospital | Attending: Emergency Medicine | Admitting: Emergency Medicine

## 2022-01-02 ENCOUNTER — Emergency Department (HOSPITAL_COMMUNITY)
Admission: EM | Admit: 2022-01-02 | Discharge: 2022-01-02 | Disposition: A | Discharge status: Other Acute Inpatient Hospital | Payer: Medicare Other | Source: Home / Self Care | Attending: Emergency Medicine | Admitting: Emergency Medicine

## 2022-01-02 DIAGNOSIS — I456 Pre-excitation syndrome: Secondary | ICD-10-CM | POA: Diagnosis not present

## 2022-01-02 DIAGNOSIS — F29 Unspecified psychosis not due to a substance or known physiological condition: Secondary | ICD-10-CM

## 2022-01-02 DIAGNOSIS — K5900 Constipation, unspecified: Secondary | ICD-10-CM | POA: Diagnosis not present

## 2022-01-02 DIAGNOSIS — F200 Paranoid schizophrenia: Secondary | ICD-10-CM | POA: Diagnosis not present

## 2022-01-02 DIAGNOSIS — F259 Schizoaffective disorder, unspecified: Secondary | ICD-10-CM | POA: Diagnosis present

## 2022-01-02 DIAGNOSIS — R45851 Suicidal ideations: Secondary | ICD-10-CM | POA: Insufficient documentation

## 2022-01-02 DIAGNOSIS — G4700 Insomnia, unspecified: Secondary | ICD-10-CM | POA: Diagnosis present

## 2022-01-02 DIAGNOSIS — F319 Bipolar disorder, unspecified: Secondary | ICD-10-CM | POA: Diagnosis not present

## 2022-01-02 DIAGNOSIS — Z79899 Other long term (current) drug therapy: Secondary | ICD-10-CM | POA: Diagnosis not present

## 2022-01-02 DIAGNOSIS — F17290 Nicotine dependence, other tobacco product, uncomplicated: Secondary | ICD-10-CM | POA: Diagnosis not present

## 2022-01-02 DIAGNOSIS — Z560 Unemployment, unspecified: Secondary | ICD-10-CM

## 2022-01-02 DIAGNOSIS — Z4802 Encounter for removal of sutures: Secondary | ICD-10-CM

## 2022-01-02 DIAGNOSIS — Z23 Encounter for immunization: Secondary | ICD-10-CM

## 2022-01-02 DIAGNOSIS — F909 Attention-deficit hyperactivity disorder, unspecified type: Secondary | ICD-10-CM | POA: Diagnosis not present

## 2022-01-02 DIAGNOSIS — Z880 Allergy status to penicillin: Secondary | ICD-10-CM | POA: Diagnosis not present

## 2022-01-02 LAB — COMPREHENSIVE METABOLIC PANEL
ALT: 22 U/L (ref 0–44)
AST: 23 U/L (ref 15–41)
Albumin: 5 g/dL (ref 3.5–5.0)
Alkaline Phosphatase: 59 U/L (ref 38–126)
Anion gap: 7 (ref 5–15)
BUN: 11 mg/dL (ref 6–20)
CO2: 24 mmol/L (ref 22–32)
Calcium: 9.4 mg/dL (ref 8.9–10.3)
Chloride: 104 mmol/L (ref 98–111)
Creatinine, Ser: 0.69 mg/dL (ref 0.61–1.24)
GFR, Estimated: >60 mL/min (ref >60)
Glucose, Bld: 98 mg/dL (ref 70–99)
Potassium: 3.4 mmol/L — ABNORMAL LOW (ref 3.5–5.1)
Sodium: 135 mmol/L (ref 135–145)
Total Bilirubin: 1.9 mg/dL — ABNORMAL HIGH (ref 0.3–1.2)
Total Protein: 7.7 g/dL (ref 6.5–8.1)

## 2022-01-02 LAB — RESP PANEL BY RT-PCR (FLU A&B, COVID) ARPGX2
Influenza A by PCR: NEGATIVE
Influenza B by PCR: NEGATIVE
SARS Coronavirus 2 by RT PCR: NEGATIVE

## 2022-01-02 LAB — CBC
HCT: 45.2 % (ref 39.0–52.0)
Hemoglobin: 15.6 g/dL (ref 13.0–17.0)
MCH: 30.6 pg (ref 26.0–34.0)
MCHC: 34.5 g/dL (ref 30.0–36.0)
MCV: 88.8 fL (ref 80.0–100.0)
Platelets: 277 10*3/uL (ref 150–400)
RBC: 5.09 MIL/uL (ref 4.22–5.81)
RDW: 12 % (ref 11.5–15.5)
WBC: 13 10*3/uL — ABNORMAL HIGH (ref 4.0–10.5)
nRBC: 0 % (ref 0.0–0.2)

## 2022-01-02 LAB — ETHANOL: Alcohol, Ethyl (B): <10 mg/dL (ref <10)

## 2022-01-02 LAB — RAPID URINE DRUG SCREEN, HOSP PERFORMED
Amphetamines: NOT DETECTED
Barbiturates: NOT DETECTED
Benzodiazepines: NOT DETECTED
Cocaine: NOT DETECTED
Opiates: NOT DETECTED
Tetrahydrocannabinol: NOT DETECTED

## 2022-01-02 LAB — SALICYLATE LEVEL: Salicylate Lvl: <7 mg/dL — ABNORMAL LOW (ref 7.0–30.0)

## 2022-01-02 LAB — ACETAMINOPHEN LEVEL: Acetaminophen (Tylenol), Serum: <10 ug/mL — ABNORMAL LOW (ref 10–30)

## 2022-01-02 MED ORDER — TRAZODONE HCL 50 MG PO TABS
50.0000 mg | ORAL_TABLET | Freq: Every evening | ORAL | Status: DC | PRN
  Administered 2022-01-02 – 2022-01-14 (×12): 50 mg via ORAL
  Filled 2022-01-02 (×12): qty 1

## 2022-01-02 MED ORDER — LORAZEPAM 1 MG PO TABS: 1.0000 mg | ORAL_TABLET | ORAL | Status: DC | PRN

## 2022-01-02 MED ORDER — HYDROXYZINE HCL 25 MG PO TABS
25.0000 mg | ORAL_TABLET | Freq: Three times a day (TID) | ORAL | Status: DC | PRN
  Administered 2022-01-02 – 2022-01-16 (×10): 25 mg via ORAL
  Filled 2022-01-02 (×10): qty 1

## 2022-01-02 MED ORDER — OLANZAPINE 5 MG PO TBDP
5.0000 mg | ORAL_TABLET | Freq: Three times a day (TID) | ORAL | Status: DC | PRN
  Administered 2022-01-03 – 2022-01-05 (×3): 5 mg via ORAL
  Filled 2022-01-02 (×2): qty 1

## 2022-01-02 MED ORDER — ALUM & MAG HYDROXIDE-SIMETH 200-200-20 MG/5ML PO SUSP: 30.0000 mL | ORAL | Status: DC | PRN

## 2022-01-02 MED ORDER — MAGNESIUM HYDROXIDE 400 MG/5ML PO SUSP: 30.0000 mL | Freq: Every day | ORAL | Status: DC | PRN

## 2022-01-02 MED ORDER — ZIPRASIDONE MESYLATE 20 MG IM SOLR: 20.0000 mg | INTRAMUSCULAR | Status: DC | PRN

## 2022-01-02 MED ORDER — ACETAMINOPHEN 325 MG PO TABS: 650.0000 mg | ORAL_TABLET | Freq: Four times a day (QID) | ORAL | Status: DC | PRN

## 2022-01-02 NOTE — ED Notes (Signed)
Attempted to call nurse Lennox Grumbles at [facility] 2 times for report.

## 2022-01-02 NOTE — ED Notes (Signed)
Per [facility], pt needs medical clearance and then he can go to [facility] RM [phone].

## 2022-01-02 NOTE — ED Notes (Signed)
Report given to Rudy RN.

## 2022-01-02 NOTE — ED Provider Notes (Signed)
[place] EMERGENCY DEPARTMENT Provider Note    CSN: [id number] Arrival date & time: 01/02/22 1745  History Chief Complaint  Patient presents with   IVC   Medical Clearance    Miguel Shea is a 26 y.o. male with history of bipolar and schizophrenia/schizoaffective disorder went to [facility] today voluntarily to get help for AVH and SI. They recommended admission but apparently he then walked out. IVC papers were taken out and the patient was brought here for evaluation. He denies any somatic complaints.    Home Medications Prior to Admission medications   Not on File     Allergies    Augmentin [amoxicillin-pot clavulanate]   Review of Systems   Review of Systems Please see HPI for pertinent positives and negatives  Physical Exam BP (!) 139/92 (BP Location: Left Arm)    Pulse 85    Temp 98 F (36.7 C) (Oral)    Resp 16    Ht 6' (1.829 m)    Wt 81.6 kg    SpO2 100%    BMI 24.41 kg/m   Physical Exam Vitals and nursing note reviewed.  Constitutional:      Appearance: Normal appearance.  HENT:     Head: Normocephalic and atraumatic.     Nose: Nose normal.     Mouth/Throat:     Mouth: Mucous membranes are moist.  Eyes:     Extraocular Movements: Extraocular movements intact.     Conjunctiva/sclera: Conjunctivae normal.  Cardiovascular:     Rate and Rhythm: Normal rate.  Pulmonary:     Effort: Pulmonary effort is normal.     Breath sounds: Normal breath sounds.  Abdominal:     General: Abdomen is flat.     Palpations: Abdomen is soft.     Tenderness: There is no abdominal tenderness.  Musculoskeletal:        General: No swelling. Normal range of motion.     Cervical back: Neck supple.  Skin:    General: Skin is warm and dry.     Comments: Wounds on R forearm from recent glass injury. There is one remaining suture, no signs of infection.   Neurological:     General: No focal deficit present.     Mental Status: He is alert.  Psychiatric:     Comments: Flat  affect    ED Results / Procedures / Treatments   EKG None  Procedures .Suture Removal  Date/Time: 01/02/2022 7:42 PM Performed by: Truddie Hidden, MD Authorized by: Truddie Hidden, MD   Location:    Location:  Upper extremity   Upper extremity location:  Arm   Arm location:  R lower arm Procedure details:    Wound appearance:  No signs of infection   Number of sutures removed:  1 Post-procedure details:    Post-removal:  No dressing applied   Procedure completion:  Tolerated well, no immediate complications  Medications Ordered in the ED Medications - No data to display  Initial Impression and Plan  Patient here under IVC, labs ordered in triage reviewed, Mild leukocytosis of unclear significance, CMP is unremarkable. ASA, APAP and ETOH are neg. UDS is pending. Will touch base with Psych team regarding dispo.   ED Course   Clinical Course as of 01/02/22 2004  Tue Jan 02, 2022  2001 Per Psych team at [facility], they are holding a bed for this patient and he can be taken back over there. RN aware to call report.  [CS]  2002  He is otherwise medically cleared.  [CS]    Clinical Course User Index [CS] Truddie Hidden, MD     MDM Rules/Calculators/A&P Medical Decision Making Problems Addressed: Psychosis, unspecified psychosis type [facility]): acute illness or injury that poses a threat to life or bodily functions Suicidal ideation: acute illness or injury that poses a threat to life or bodily functions  Amount and/or Complexity of Data Reviewed Labs:  Decision-making details documented in ED Course.  Risk Decision regarding hospitalization.    Final Clinical Impression(s) / ED Diagnoses Final diagnoses:  Psychosis, unspecified psychosis type ([place])  Suicidal ideation    Rx / DC Orders ED Discharge Orders     None        Truddie Hidden, MD 01/02/22 2004

## 2022-01-02 NOTE — ED Provider Notes (Signed)
Emergency Medicine Provider Triage Evaluation Note  Miguel Shea , a 26 y.o. male  was evaluated in triage.  Patient brought in by [employer] under IVC for medical clearance and transfer to [facility].  Per IVC paperwork, patient endorsed suicidal and homicidal ideation earlier today with active plans.  Patient states he has a history of bipolar disorder and schizophrenia, and has been dealing with a "episode" for the last year.  He is endorsing visual hallucinations of "vivid colors" and "repeating images".  He is also endorsing auditory hallucinations of hearing several voices that instruct him to do things, often instructing him to harm himself.    Has no other complaints at this time.  Review of Systems  Positive: Visual and auditory hallucinations, passive suicidal ideation Negative: Homicidal ideation  Physical Exam  BP (!) 139/92 (BP Location: Left Arm)    Pulse 85    Temp 98 F (36.7 C) (Oral)    Resp 16    Ht 6' (1.829 m)    Wt 81.6 kg    SpO2 100%    BMI 24.41 kg/m  Gen:   Awake, no distress   Resp:  Normal effort  MSK:   Moves extremities without difficulty  Other:  partially healed laceration on his right forearm that he states is from punching a window  Medical Decision Making  Medically screening exam initiated at 6:36 PM.  Appropriate orders placed.  Miguel Shea was informed that the remainder of the evaluation will be completed by another provider, this initial triage assessment does not replace that evaluation, and the importance of remaining in the ED until their evaluation is complete.  Reviewed IVC paperwork and ordered medical screening labs.  Will consult TTS.   Jeanella Flattery 01/02/22 Alycia Patten, MD 01/03/22 1143

## 2022-01-02 NOTE — ED Triage Notes (Signed)
Pt arrived with [employer]  with IVC paperwork, pt is to be medically cleared and transferred to [facility]. Visual and Auditory Hallucinations.

## 2022-01-02 NOTE — BH Assessment (Addendum)
Comprehensive Clinical Assessment (CCA) Note  01/02/2022 Miguel Shea [id number]  Disposition: TTS completed. Per Miguel Cedar, MD, patient meets criteria for inpatient psychiatric treatment. Patient to be admitted to the adult unit for crises stabilization. Clinician informed the [place] Dietrich Pates, [place]) who was agreeable to admitting patient to the adult unit.    Clinician later informed by security, "The walk-in left, he just walked right out the door with some other people, he walked right behind them". Clinician notified the [place] who confirmed that patient had left the premises.   The [place] notified Miguel Shea, Miguel Grayer, MD that patient had left the premises, AMA. Therefore, Miguel Levins, MD, initiated IVC papers.   Miguel Shea Admission (Current) from OP Visit from 01/02/2022 in [facility] ED from 03/01/2021 in [facility] ED from 12/28/2020 in [facility] DEPT  C-SSRS RISK CATEGORY Moderate Risk Error: Q7 should not be populated when Q6 is No No Risk      The patient demonstrates the following risk factors for suicide: Chronic risk factors for suicide include: psychiatric disorder of Schizoaffective Disorder, Bipolar Disorder, Schizophrenia, OCD; Anxiety Disorder . Acute risk factors for suicide include: social withdrawal/isolation. Protective factors for this patient include:  his pet (cat) . Considering these factors, the overall suicide risk at this point appears to be moderate. Patient is appropriate for outpatient follow up after he has been psych cleared by a [facility] provider.    Chief Complaint:  Chief Complaint  Patient presents with   schizoaffective disorder   Visit Diagnosis: Schizoaffective Disorder, Bipolar Disorder, Schizophrenia, OCD; Anxiety Disorder   Miguel Shea is a 26 y/o male that presents to [facility] as a walk-in. He is a self referral. He has a hx of  Schizoaffective Disorder, Bipolar Disorder, Schizophrenia, OCD and 4 prior hospitalizations.The most recent hospitalization was  8 months ago at [facility].  Patient reports that he came in today "Because I have schizophrenia and Bipolar Disorder".  He reports his symptoms started about 2 years ago and he has been without medications for  year and a half. He continues to explain that he has auditory hallucinations that say "repetitive phrases". He provides an example of something the voice may say, "Think this, think that". The voices provide him with "thought guidance"; he says that they are "instructive"; "they wright me stores, tell me what I have the wright to do". He feels as if people are reading his thoughts or putting thoughts in his head. Patient also acknowledges visual hallucinations described as "Imagery, distorted reality, particles, objectively specialities".   He denies current suicidal ideations. However, he felt suicidal earlier today and reports frequent thoughts of suicide. Clinician asked patient if he had a plan and he stated, "A knife". Denies access to firearms. Patient asked if he has made prior suicide attempts/gestures and he does not answer the question directly. However, proceeds to speak about punching his hand through a glass window. He has visible bruised knuckles and cuts on his arm indicating that the incident occurred recently. Current depressive symptom: he is unable to get out of the bed. Anxiety is severe. Appetite is poor and he reports weight loss after he stopped taking his psychotropic medications. He reports not sleeping well; 8 hrs per night. No known family hx of mental health illnesses.   Patient denies current homicidal ideations. However, experienced homicidal ideations towards his mothers aid earlier today. He did not elaborate on the reason  for those homicidal ideations. Denies hx of aggressive and/or assault ive behaviors. Denies that he has legal issues.  Denies alcohol/drug use. He does vape.  Patient identifies his cat as his support system. He later mentions his mother and grandmother. However, he provided unclear responses when asked if they lived locally. He is currently on disability x1.5 years for his mental health sx's and Autism. He lives alone and has an associates degree in Visual merchandiser.  Patient does not have a current outpatient therapist/psychiatrist. However, states that he he has tried medications in the past: Tegretol, Risperidal, Lithium, Depakote, Invega (oral), and Haldol. Today, he is requesting stronger psychotropic educations.   CCA Screening, Triage and Referral (STR)  Patient Reported Information How did you hear about [place]? No data recorded What Is the Reason for Your Visit/Call Today? Miguel Shea is a 25 y.o. male who presents with hallucinations, SI, and HI. PPHx is significant for Schizoaffective Disorder, Bipolar Disorder, Schizophrenia, OCD and 4 prior hospitalizations (latest [facility] 3/22).        Patient reports that he came in today because he has schizophrenia and bipolar disorder.  He reports his symptoms started about 2 years ago and states he has been unmedicated for about a year and a half.  He reports that he is having command auditory hallucinations.  He states that the voices will tell him what to do.  And at other times the voices will be repetitive sounds/phrases (for example think this/think that).  When asked about visual hallucinations he states its not something he sees visually but imaginatively.  He reports he "sees distorted reality, sees the particles differently than what they are."  He then states that his "bipolar schizophrenia is almost catatonic not word salad but a process that stalls".       When asked about SI he states that he has had that.  When asked when the most recent time was he said earlier today.  He reports that he had a plan to cut himself with a knife.  When asked to  be never attempted to harm himself before he states he punched a window and pointed to a healed U-shaped laceration on his right forearm.  When asked about HI he reports he did have this earlier today.  He stated it was towards his grandmother's aide but he stated "I maintain my distance so is okay."  He states he had no reason that he just got mad but he reports he did not have a plan.     He reports a past psychiatric history of schizoaffective disorder (he disagrees with this), OCD, bipolar disorder, and schizophrenia.  He reports that he has been in a psychiatric unit for times.  He states last time was about a year ago at [facility] (on chart review it is recorded he was transferred to [facility] March 2022).  He states that he last saw a psychiatrist about a year and a half ago but lost touch with them because he moved.  When asked if he had follow-up from his latest hospitalization he states that follow-ups were made but but did not comment further.  He reports past medication trials include: Zyprexa, Risperdal, Depakote, Tegretol, propanolol, lithium, Invega (oral), and BuSpar.  Reports family psychiatric history of his half-sister having substance abuse, he reports no known history of suicide attempts or completions.  How Long Has This Been Causing You Problems? > than 6 months  What Do You Feel Would Help You the  Most Today? Treatment for Depression or other mood problem; Medication(s)   Have You Recently Had Any Thoughts About Hurting Yourself? Yes  Are You Planning to Commit Suicide/Harm Yourself At This time? No   Have you Recently Had Thoughts About [place]? Yes  Are You Planning to Harm Someone at This Time? No  Explanation: No data recorded  Have You Used Any Alcohol or Drugs in the Past 24 Hours? No  How Long Ago Did You Use Drugs or Alcohol? No data recorded What Did You Use and How Much? No data recorded  Do You Currently Have a Therapist/Psychiatrist?  No  Name of Therapist/Psychiatrist: No data recorded  Have You Been Recently Discharged From Any Office Practice or Programs? No  Explanation of Discharge From Practice/Program: No data recorded    CCA Screening Triage Referral Assessment Type of Contact: Tele-Assessment  Telemedicine Service Delivery: Telemedicine service delivery: This service was provided via telemedicine using a 2-way, interactive audio and video technology  Is this Initial or Reassessment? Initial Assessment  Date Telepsych consult ordered in [facility]:  01/02/22  Time Telepsych consult ordered in [facility]:  No data recorded Location of Assessment: [facility] ED  Provider Location: [facility]   Collateral Involvement: Miguel Shea   Does Patient Have a [place]? No data recorded Name and Contact of Legal Guardian: No data recorded If Minor and Not Living with Parent(s), Who has Custody? No data recorded Is CPS involved or ever been involved? No data recorded Is APS involved or ever been involved? Never   Patient Determined To Be At Risk for Harm To Self or Others Based on Review of Patient Reported Information or Presenting Complaint? Yes, for Self-Harm  Method: No data recorded Availability of Means: No data recorded Intent: No data recorded Notification Required: No data recorded Additional Information for Danger to Others Potential: No data recorded Additional Comments for Danger to Others Potential: No data recorded Are There Guns or Other Weapons in Your Home? No data recorded Types of Guns/Weapons: No data recorded Are These Weapons Safely Secured?                            No data recorded Who Could Verify You Are Able To Have These Secured: No data recorded Do You Have any Outstanding Charges, Pending Court Dates, Parole/Probation? No data recorded Contacted To Inform of Risk of Harm To Self or Others: No data recorded   Does Patient Present under Involuntary  Commitment? Yes  IVC Papers Initial File Date: 01/02/22 (IVC papers initiated after patient presented to [facility] in need of inpatient services. He left AMA. The examing provider initiated IVC papers.)   [place] of Residence: [place]   Patient Currently Receiving the Following Services: -- (No psychiatric services at this time.)   Determination of Need: Emergent (2 hours)   Options For Referral: Medication Management; Inpatient Hospitalization     CCA Biopsychosocial Patient Reported Schizophrenia/Schizoaffective Diagnosis in Past: Yes   Strengths: UTA   Mental Health Symptoms Depression:   None   Duration of Depressive symptoms:    Mania:   Recklessness; Change in energy/activity; Irritability   Anxiety:    Worrying; Tension; Sleep   Psychosis:   Delusions; Hallucinations   Duration of Psychotic symptoms:  Duration of Psychotic Symptoms: Greater than six months   Trauma:   None   Obsessions:   None   Compulsions:   None   Inattention:  Disorganized   Hyperactivity/Impulsivity:   N/A   Oppositional/Defiant Behaviors:   N/A   Emotional Irregularity:   N/A   Other Mood/Personality Symptoms:  No data recorded   Mental Status Exam Appearance and self-care  Stature:   Average   Weight:   Average weight   Clothing:   Age-appropriate   Grooming:   Normal   Cosmetic use:   None   Posture/gait:   Normal   Motor activity:   Agitated   Sensorium  Attention:   Distractible   Concentration:   Scattered   Orientation:   Place; Person   Recall/memory:   Normal   Affect and Mood  Affect:   Anxious   Mood:   Anxious; Irritable   Relating  Eye contact:   Staring   Facial expression:   Anxious   Attitude toward examiner:   Guarded; Suspicious   Thought and Language  Speech flow:  Clear and Coherent   Thought content:   Persecutions; Suspicious   Preoccupation:   Ruminations   Hallucinations:   None    Organization:  No data recorded  [employer] of Knowledge:   Impoverished by (Comment)   Intelligence:   Average   Abstraction:   Normal   Judgement:   Impaired   Reality Testing:   Distorted   Insight:   Fair   Decision Making:   Impulsive   Social Functioning  Social Maturity:   Impulsive; Irresponsible   Social Judgement:  No data recorded  Stress  Stressors:   Family conflict   Coping Ability:   Programme researcher, broadcasting/film/video Deficits:   Responsibility   Supports:   Family     Religion: Religion/Spirituality Are You A Religious Person?: No  Leisure/Recreation: Leisure / Recreation Do You Have Hobbies?: No  Exercise/Diet: Exercise/Diet Do You Exercise?: No Have You Gained or Lost A Significant Amount of Weight in the Past Six Months?: No Do You Follow a Special Diet?: No Do You Have Any Trouble Sleeping?: Yes Explanation of Sleeping Difficulties: 6-8 hours   CCA Employment/Education Employment/Work Situation: Employment / Work Situation Employment Situation: On disability Why is Patient on Disability: Mental Health Patient's Job has Been Impacted by Current Illness: No Has Patient ever Been in the [employer]?: No  Education: Education Is Patient Currently Attending School?: No Last Grade Completed:  (college degree) Did You Attend College?: Yes What Type of College Degree Do you Have?: Associate in [employer] Did You Have An Individualized Education Program (IIEP): No Did You Have Any Difficulty At School?:  (unknown) Patient's Education Has Been Impacted by Current Illness:  (unknown)   CCA Family/Childhood History Family and Relationship History: Family history Marital status: Single Does patient have children?: No  Childhood History:  Childhood History By whom was/is the patient raised?: Both parents Did patient suffer any verbal/emotional/physical/sexual abuse as a child?: No Did patient suffer from  severe childhood neglect?: No Has patient ever been sexually abused/assaulted/raped as an adolescent or adult?: No Was the patient ever a victim of a crime or a disaster?: No Witnessed domestic violence?: No  Child/Adolescent Assessment:     CCA Substance Use Alcohol/Drug Use: Alcohol / Drug Use Pain Medications: See MAR Prescriptions: See MAR Over the Counter: See MAR History of alcohol / drug use?: No history of alcohol / drug abuse (no alcohol and/or drug use; he currently vapes)  ASAM's:  Six Dimensions of Multidimensional Assessment  Dimension 1:  Acute Intoxication and/or Withdrawal Potential:      Dimension 2:  Biomedical Conditions and Complications:      Dimension 3:  Emotional, Behavioral, or Cognitive Conditions and Complications:     Dimension 4:  Readiness to Change:     Dimension 5:  Relapse, Continued use, or Continued Problem Potential:     Dimension 6:  Recovery/Living Environment:     ASAM Severity Score:    ASAM Recommended Level of Treatment:     Substance use Disorder (SUD)    Recommendations for Services/Supports/Treatments: Recommendations for Services/Supports/Treatments Recommendations For Services/Supports/Treatments: Medication Management, ACCTT (Assertive Community Treatment), Inpatient Hospitalization  Discharge Disposition:    DSM5 Diagnoses: Patient Active Problem List   Diagnosis Date Noted   Delusions ([place]) 03/03/2021   Autism spectrum disorder 12/29/2020   Bipolar 1 disorder ([place]) 12/29/2020     Referrals to Alternative Service(s): Referred to Alternative Service(s):   Place:   Date:   Time:    Referred to Alternative Service(s):   Place:   Date:   Time:    Referred to Alternative Service(s):   Place:   Date:   Time:    Referred to Alternative Service(s):   Place:   Date:   Time:     Waldon Merl, Counselor

## 2022-01-02 NOTE — H&P (Addendum)
Behavioral Health Medical Screening Exam  Miguel Shea is a 26 y.o. male who presents with hallucinations, SI, and HI. PPHx is significant for Schizoaffective Disorder, Bipolar Disorder, Schizophrenia, OCD and 4 prior hospitalizations (latest [facility] 3/22).   Patient reports that he came in today because he has schizophrenia and bipolar disorder.  He reports his symptoms started about 2 years ago and states he has been unmedicated for about a year and a half.  He reports that he is having command auditory hallucinations.  He states that the voices will tell him what to do.  And at other times the voices will be repetitive sounds/phrases (for example think this/think that).  When asked about visual hallucinations he states its not something he sees visually but imaginatively.  He reports he "sees distorted reality, sees the particles differently than what they are."  He then states that his "bipolar schizophrenia is almost catatonic not word salad but a process that stalls".    When asked about SI he states that he has had that.  When asked when the most recent time was he said earlier today.  He reports that he had a plan to cut himself with a knife.  When asked to be never attempted to harm himself before he states he punched a window and pointed to a healed U-shaped laceration on his right forearm.  When asked about HI he reports he did have this earlier today.  He stated it was towards his grandmother's aide but he stated "I maintain my distance so is okay."  He states he had no reason that he just got mad but he reports he did not have a plan.  He reports a past psychiatric history of schizoaffective disorder (he disagrees with this), OCD, bipolar disorder, and schizophrenia.  He reports that he has been in a psychiatric unit for times.  He states last time was about a year ago at [facility] (on chart review it is recorded he was transferred to [facility] March 2022).  He states that  he last saw a psychiatrist about a year and a half ago but lost touch with them because he moved.  When asked if he had follow-up from his latest hospitalization he states that follow-ups were made but but did not comment further.  He reports past medication trials include: Zyprexa, Risperdal, Depakote, Tegretol, propanolol, lithium, Invega (oral), and BuSpar.  Reports family psychiatric history of his half-sister having substance abuse, he reports no known history of suicide attempts or completions.  He reports that he lives alone with his cat.  When asked about his support system he states his cat and his family.  When asked if there were relatives nearby he states that his mom and grandmother live nearby (in [place]).  He states he has an associates degree in Research officer, trade union, Engineer, production, Corporate investment banker.  He reports his hobbies are taking care of his cat.  He reports that he is on disability, has been for approximately 2 years due to autism, bipolar disorder, and schizophrenia.  He states he does not currently drink alcohol and that the last time was "a while ago" approximately a year.  He states that he does vape.  He reports no drug use.  He reports no access to fire arms.  He reports that he is interested in inpatient treatment and is wanting to restart medications (states- "I want something stronger like Haldol and a mood stabilizer).     Total Time spent with patient: 45 minutes  Psychiatric Specialty Exam:  Presentation  General Appearance: Disheveled; Casual Eye Contact:Poor Speech:Clear and Coherent; Slow (occasional thought blocking) Speech Volume:Normal Handedness:No data recorded  Mood and Affect  Mood:Depressed; Hopeless; Worthless Affect:Congruent; Flat; Depressed; Constricted  Thought Process  Thought Processes:-- (mix of coherent and disorganized) Descriptions of Associations:Intact  Orientation:Full (Time, Place and Person)  Thought Content:Delusions;  Rumination  History of Schizophrenia/Schizoaffective disorder:Yes  Duration of Psychotic Symptoms:Greater than six months  Hallucinations:Hallucinations: Auditory; Command Description of Command Hallucinations: voices telling him what to do Description of Auditory Hallucinations: repetitive sounds/phrases  Ideas of Reference:No data recorded Suicidal Thoughts:Suicidal Thoughts: Yes, Active SI Active Intent and/or Plan: With Plan  Homicidal Thoughts:Homicidal Thoughts: Yes, Active (towards grandmothers aid- "I maintained my distance")   Sensorium  Memory:Immediate Fair; Recent Fair Judgment:Fair Insight:Present  Executive Functions  Concentration:Good Attention Span:Good [place]  Psychomotor Activity  Psychomotor Activity:Psychomotor Activity: Normal  Assets  Assets:Desire for Improvement; Physical Health; Resilience  Sleep  Sleep:Sleep: Poor (reports that it varies)   Physical Exam: Physical Exam Vitals and nursing note reviewed.  Constitutional:      General: He is not in acute distress.    Appearance: Normal appearance. He is normal weight. He is not ill-appearing or toxic-appearing.  HENT:     Head: Normocephalic and atraumatic.  Pulmonary:     Effort: Pulmonary effort is normal.  Musculoskeletal:        General: Normal range of motion.  Neurological:     General: No focal deficit present.     Mental Status: He is alert.   Review of Systems  Respiratory:  Negative for cough and shortness of breath.   Cardiovascular:  Negative for chest pain.  Gastrointestinal:  Negative for abdominal pain, constipation, diarrhea, nausea and vomiting.  Neurological:  Negative for weakness and headaches.  Psychiatric/Behavioral:  Positive for depression, hallucinations and suicidal ideas. The patient is nervous/anxious.   Blood pressure 137/85, pulse 92, temperature 98.8 F (37.1 C), temperature source Oral, resp. rate 18. There  is no height or weight on file to calculate BMI.  Musculoskeletal: Strength & Muscle Tone: within normal limits Gait & Station: normal Patient leans: N/A   Recommendations:  Based on my evaluation the patient does not appear to have an emergency medical condition.   The patient meets criteria for inpatient hospitalization.  He meets criteria for Schizoaffective given his hallucinations, disorganized thinking, and per history obtained from chart review. Preadmission COVID, Flu A & B ordered. Labs ordered once admitted- CBC, CMP, TSH, Lipid Panel, A1c, UDS, and EKG. Agitation Protocol ordered: Zyprexa/Ativan/Geodon. PRN's ordered- Tylenol, Maalox, Atarax, Milk of Magnesia, Trazodone.  Will not restart medication at this time will defer to inpatient team. Given patients SI and HI along with his current psychosis he will be IVC'd.  Briant Cedar, MD 01/02/2022, 2:12 PM

## 2022-01-03 ENCOUNTER — Encounter (HOSPITAL_COMMUNITY): Payer: Self-pay | Admitting: Psychiatry

## 2022-01-03 ENCOUNTER — Encounter (HOSPITAL_COMMUNITY): Payer: Self-pay

## 2022-01-03 DIAGNOSIS — F200 Paranoid schizophrenia: Secondary | ICD-10-CM

## 2022-01-03 MED ORDER — INFLUENZA VAC SPLIT QUAD 0.5 ML IM SUSY
0.5000 mL | PREFILLED_SYRINGE | INTRAMUSCULAR | Status: AC
Start: 2022-01-04 — End: 2022-01-04
  Administered 2022-01-04: 0.5 mL via INTRAMUSCULAR
  Filled 2022-01-03: qty 0.5

## 2022-01-03 MED ORDER — OLANZAPINE 2.5 MG PO TABS
2.5000 mg | ORAL_TABLET | Freq: Once | ORAL | Status: AC
  Administered 2022-01-03: 2.5 mg via ORAL
  Filled 2022-01-03 (×2): qty 1

## 2022-01-03 MED ORDER — OLANZAPINE 5 MG PO TABS
5.0000 mg | ORAL_TABLET | Freq: Every day | ORAL | Status: DC
  Administered 2022-01-03: 5 mg via ORAL
  Filled 2022-01-03 (×2): qty 1

## 2022-01-03 NOTE — Progress Notes (Signed)
Admission Note:  Patient is 26 year old male who presents to [facility] from [facility] under IVC for self reporting AH/VH and SI/HI. Patient reports he stopped taking his psychotropic medication and he recently went through a break up. Patient reports his auditory hallucinations are command in nature and that they tell him to harm other people and himself. Patient reports he is positive for SI with a plan to cut himself at time of assessment. Patient is anxious and fidgety during assessment. Patient also appears to be thought blocking and responding to internal stimuli. Patient is safe on unit at this time.

## 2022-01-03 NOTE — Progress Notes (Signed)
Pt stays in the room much of the evening, pt continued to endorse AVH    01/03/22 2000  Psych Admission Type (Psych Patients Only)  Admission Status Involuntary  Psychosocial Assessment  Patient Complaints Anxiety;Worrying;Suspiciousness;Restlessness  Eye Contact Avoids;Brief  Facial Expression Anxious;Sullen;Sad;Worried  Affect Anxious;Depressed;Sad;Sullen  Speech Logical/coherent;Soft  Interaction Cautious;Forwards little;Guarded;Minimal  Motor Activity Pacing  Appearance/Hygiene Disheveled  Behavior Characteristics Cooperative  Mood Anxious;Preoccupied  Thought Conservation officer, historic buildings;Concrete thinking  Content Paranoia  Delusions Paranoid  Perception Hallucinations  Hallucination Auditory;Command;Visual  Judgment Poor  Confusion None  Danger to Self  Current suicidal ideation? Denies;Passive  Self-Injurious Behavior No self-injurious ideation or behavior indicators observed or expressed   Danger to Others  Danger to Others None reported or observed

## 2022-01-03 NOTE — Progress Notes (Deleted)
Pt stays in the room much of the evening, pt continued to endorse AVH    01/03/22 2000  Psych Admission Type (Psych Patients Only)  Admission Status Involuntary  Psychosocial Assessment  Patient Complaints Anxiety;Worrying;Suspiciousness;Restlessness  Eye Contact Avoids;Brief  Facial Expression Anxious;Sullen;Sad;Worried  Affect Anxious;Depressed;Sad;Sullen  Speech Logical/coherent;Soft  Interaction Cautious;Forwards little;Guarded;Minimal  Motor Activity Pacing  Appearance/Hygiene Disheveled  Behavior Characteristics Cooperative  Mood Anxious;Preoccupied  Thought Radio producer;Concrete thinking  Content Paranoia  Delusions Paranoid  Perception Hallucinations  Hallucination Auditory;Command;Visual  Judgment Poor  Confusion None  Danger to Self  Current suicidal ideation? Denies  Self-Injurious Behavior No self-injurious ideation or behavior indicators observed or expressed   Danger to Others  Danger to Others None reported or observed

## 2022-01-03 NOTE — Progress Notes (Signed)
Ekg attempted. Pt too tremulous. Machine wouldn't obtain bec of tremors in lead 1 and 2.

## 2022-01-03 NOTE — Progress Notes (Signed)
Nutrition Brief Note  Patient identified on the Malnutrition Screening Tool (MST) Report  No weight loss per weight records.  Wt Readings from Last 15 Encounters:  01/02/22 94.1 kg  01/02/22 81.6 kg  10/16/21 81.6 kg  03/02/21 82 kg  12/28/20 81.6 kg    Body mass index is 27.19 kg/m. Patient meets criteria for overweight based on current BMI.   Current diet order is regular. Labs and medications reviewed.   No nutrition interventions warranted at this time. If nutrition issues arise, please consult RD.   Tilda Franco, MS, RD, LDN Inpatient Clinical Dietitian Contact information available via Amion

## 2022-01-03 NOTE — Tx Team (Signed)
Initial Treatment Plan 01/03/2022 12:50 AM Miguel Shea [id number]    PATIENT STRESSORS: Marital or family conflict   Medication change or noncompliance     PATIENT STRENGTHS: Motivation for treatment/growth  Supportive family/friends    PATIENT IDENTIFIED PROBLEMS: Suicidal Ideation   (Communication skills)                   DISCHARGE CRITERIA:  Improved stabilization in mood, thinking, and/or behavior Verbal commitment to aftercare and medication compliance  PRELIMINARY DISCHARGE PLAN: Outpatient therapy Return to previous living arrangement Return to previous work or school arrangements  PATIENT/FAMILY INVOLVEMENT: This treatment plan has been presented to and reviewed with the patient, Miguel Shea, and/or family member.  The patient and family have been given the opportunity to ask questions and make suggestions.  Mancel Bale, RN 01/03/2022, 12:50 AM

## 2022-01-03 NOTE — BHH Counselor (Signed)
CSW attempted to complete this pt's assessment however pt was sleeping and was unable to participate in assessment.    CSW will attempt to complete assessment at a later time.     Ruthann Cancer MSW, LCSW Clincal Social Worker  [facility]

## 2022-01-03 NOTE — H&P (Signed)
Psychiatric Admission Assessment Adult  Patient Identification: Miguel Shea  MRN:  [id number]  Date of Evaluation:  01/03/2022  Chief Complaint: Worsening auditory hallucinations (command in nature) telling him to do things.  Principal Diagnosis: Schizoaffective disorder ([place])  Diagnosis:  Principal Problem:   Schizoaffective disorder ([place])  History of Present Illness: This seems like the first psychiatric admission/evaluation in this [facility] for this 26 year old Caucasian male with hx of mental health issues (Schizophrenia/bipolar disorder). He walked-in to the [facility] yesterday with complaint of command auditory hallucinations telling him to do things & other times, the voices do sound repetitive such as think, think that. He reported that his hallucination is more or less a distorted reality of particles that seem different than what they are. After medical screening by one of the [facility] resident doctors, he was recommended for inpatient admission for mood stabilization treatments. During this evaluation, [name] reports,   "I'm here due to Schizophrenia. I have had it for over a year. I have a lot of paranoia. People make me uncomfortable. I was on medication for my Schizophrenia, but stopped, have not taken in any medications for at least one year. I stopped taking medications because I did not believe that I have Schizophrenia. I had a lot in my mind the time I was diagnosed with Schizophrenia.That was the reason that I did not believe that I have Schizophrenia. But, I would like to take medicines while I'm here. I know Zyprexa did help me in the past. My mood is currently bad. I used to punch the wall when mad. That is how I got the scar to my right fore-arm. The last time I punched the wall was 6 months ago. No, I'm not depressed or anxious. I'm not feeling suicidal or homicidal. I sleep well at night".  Objective: Teak presents during this evaluation, quiet, polite, verbally responsive in a  monotonic voice. He is making a minimal eye contact. There is presents of thought blocking episodes. He is disheveled. He denies any PTSD symptoms or events. Has an old healed U-shaped scar that looked like a bite mark to his right fore-arm. Azrael reports sustained this old scar from punching a wall when mad 6 months ago.  Associated Signs/Symptoms:  Depression Symptoms:   Although denies any symptoms of depression, patient presents depressed with a restricted affect, minimal eye contact.  Duration of Depression Symptoms: Greater than 2 weeks.  (Hypo) Manic Symptoms:  Delusions,  Anxiety Symptoms:  Excessive Worry,  Psychotic Symptoms:  Delusions, Paranoia,  PTSD Symptoms: Denies any PTSD symptoms or events.  Total Time spent with patient: 1 hour  Past Psychiatric History: Schizophrenia  Is the patient at risk to self? No.  Has the patient been a risk to self in the past 6 months? Yes.    Has the patient been a risk to self within the distant past? No.  Is the patient a risk to others? Yes.    Has the patient been a risk to others in the past 6 months? No.  Has the patient been a risk to others within the distant past? No.   Prior Inpatient Therapy: "Yes ([place], [place] [facility])" Prior Outpatient Therapy: "I don't have one".  Alcohol Screening: 1. How often do you have a drink containing alcohol?: Never 2. How many drinks containing alcohol do you have on a typical day when you are drinking?: 1 or 2 3. How often do you have six or more drinks on one occasion?: Never AUDIT-C  Score: 0 9. Have you or someone else been injured as a result of your drinking?: No 10. Has a relative or friend or a doctor or another health worker been concerned about your drinking or suggested you cut down?: No Alcohol Use Disorder Identification Test Final Score (AUDIT): 0  Substance Abuse History in the last 12 months:  No.  Consequences of Substance Abuse: NA  Previous  Psychotropic Medications:  Yes, Zyprexa per patient's reports.  Psychological Evaluations: No   Past Medical History:  Past Medical History:  Diagnosis Date   Bipolar 1 disorder ([place])    Wolff-Parkinson-White syndrome    History reviewed. No pertinent surgical history.  Family History: History reviewed. No pertinent family history.  Family Psychiatric  History: Drug addiction: Half-sister.  Tobacco Screening: "I vap, no cigarette smoking".  Social History: Single, has no children, lives in [place], [place], unemployed. Social History   Substance and Sexual Activity  Alcohol Use Never     Social History   Substance and Sexual Activity  Drug Use Never    Additional Social History:  Allergies:   Allergies  Allergen Reactions   Augmentin [Amoxicillin-Pot Clavulanate] Rash   Lab Results:  Results for orders placed or performed during the hospital encounter of 01/02/22 (from the past 48 hour(s))  Comprehensive metabolic panel     Status: Abnormal   Collection Time: 01/02/22  6:12 PM  Result Value Ref Range   Sodium 135 135 - 145 mmol/L   Potassium 3.4 (L) 3.5 - 5.1 mmol/L   Chloride 104 98 - 111 mmol/L   CO2 24 22 - 32 mmol/L   Glucose, Bld 98 70 - 99 mg/dL    Comment: Glucose reference range applies only to samples taken after fasting for at least 8 hours.   BUN 11 6 - 20 mg/dL   Creatinine, Ser 0.69 0.61 - 1.24 mg/dL   Calcium 9.4 8.9 - 10.3 mg/dL   Total Protein 7.7 6.5 - 8.1 g/dL   Albumin 5.0 3.5 - 5.0 g/dL   AST 23 15 - 41 U/L   ALT 22 0 - 44 U/L   Alkaline Phosphatase 59 38 - 126 U/L   Total Bilirubin 1.9 (H) 0.3 - 1.2 mg/dL   GFR, Estimated >60 >60 mL/min    Comment: (NOTE) Calculated using the CKD-EPI Creatinine Equation (2021)    Anion gap 7 5 - 15    Comment: Performed at [facility], [place] [address] [place], [name] [address]  Ethanol     Status: None   Collection Time: 01/02/22  6:12 PM  Result Value Ref Range   Alcohol, Ethyl  (B) <10 <10 mg/dL    Comment: (NOTE) Lowest detectable limit for serum alcohol is 10 mg/dL.  For medical purposes only. Performed at [facility], [place] [address] [place], [place] [id number]   Salicylate level     Status: Abnormal   Collection Time: 01/02/22  6:12 PM  Result Value Ref Range   Salicylate Lvl [id number] (L) 7.0 - 30.0 mg/dL    Comment: Performed at [facility], [place] [address] [place], [place] [address]  Acetaminophen level     Status: Abnormal   Collection Time: 01/02/22  6:12 PM  Result Value Ref Range   Acetaminophen (Tylenol), Serum <10 (L) 10 - 30 ug/mL    Comment: (NOTE) Therapeutic concentrations vary significantly. A range of 10-30 ug/mL  may be an effective concentration for many patients. However, some  are best  treated at concentrations outside of this range. Acetaminophen concentrations >150 ug/mL at 4 hours after ingestion  and >50 ug/mL at 12 hours after ingestion are often associated with  toxic reactions.  Performed at [facility], [place] [address] [place], [place] [address]   cbc     Status: Abnormal   Collection Time: 01/02/22  6:12 PM  Result Value Ref Range   WBC 13.0 (H) 4.0 - 10.5 K/uL   RBC 5.09 4.22 - 5.81 MIL/uL   Hemoglobin 15.6 13.0 - 17.0 g/dL   HCT 45.2 39.0 - 52.0 %   MCV 88.8 80.0 - 100.0 fL   MCH 30.6 26.0 - 34.0 pg   MCHC 34.5 30.0 - 36.0 g/dL   RDW 12.0 11.5 - 15.5 %   Platelets 277 150 - 400 K/uL   nRBC 0.0 0.0 - 0.2 %    Comment: Performed at [facility], [place] [address] [place], [place] [address]  Rapid urine drug screen (hospital performed)     Status: None   Collection Time: 01/02/22  7:52 PM  Result Value Ref Range   Opiates NONE DETECTED NONE DETECTED   Cocaine NONE DETECTED NONE DETECTED   Benzodiazepines NONE DETECTED NONE DETECTED   Amphetamines NONE DETECTED NONE DETECTED   Tetrahydrocannabinol NONE DETECTED NONE DETECTED   Barbiturates  NONE DETECTED NONE DETECTED    Comment: (NOTE) DRUG SCREEN FOR MEDICAL PURPOSES ONLY.  IF CONFIRMATION IS NEEDED FOR ANY PURPOSE, NOTIFY LAB WITHIN 5 DAYS.  LOWEST DETECTABLE LIMITS FOR URINE DRUG SCREEN Drug Class                     Cutoff (ng/mL) Amphetamine and metabolites    1000 Barbiturate and metabolites    200 Benzodiazepine                 [id number] Tricyclics and metabolites     300 Opiates and metabolites        300 Cocaine and metabolites        300 THC                            50 Performed at [facility], [place] [address], [place], [place] [address]    Blood Alcohol level:  Lab Results  Component Value Date   ETH <10 01/02/2022   ETH <10 123XX123   Metabolic Disorder Labs:  No results found for: HGBA1C, MPG No results found for: PROLACTIN No results found for: CHOL, TRIG, HDL, CHOLHDL, VLDL, LDLCALC  Current Medications: Current Facility-Administered Medications  Medication Dose Route Frequency Provider Last Rate Last Admin   acetaminophen (TYLENOL) tablet 650 mg  650 mg Oral Q6H PRN Briant Cedar, MD       alum & mag hydroxide-simeth (MAALOX/MYLANTA) 200-200-20 MG/5ML suspension 30 mL  30 mL Oral Q4H PRN Briant Cedar, MD       hydrOXYzine (ATARAX) tablet 25 mg  25 mg Oral TID PRN Briant Cedar, MD   25 mg at 01/03/22 0833   [START ON 01/04/2022] influenza vac split quadrivalent PF (FLUARIX) injection 0.5 mL  0.5 mL Intramuscular Tomorrow-1000 [name], [name], PA-C       OLANZapine zydis (ZYPREXA) disintegrating tablet 5 mg  5 mg Oral Q8H PRN Briant Cedar, MD   5 mg at 01/03/22 [id number]   And   LORazepam (ATIVAN) tablet 1 mg  1 mg Oral PRN  Briant Cedar, MD       And   ziprasidone (GEODON) injection 20 mg  20 mg Intramuscular PRN Briant Cedar, MD       magnesium hydroxide (MILK OF MAGNESIA) suspension 30 mL  30 mL Oral Daily PRN Kai Levins Redgie Grayer, MD       OLANZapine (ZYPREXA) tablet 2.5 mg  2.5  mg Oral Once Lindell Spar I, NP       OLANZapine (ZYPREXA) tablet 5 mg  5 mg Oral QHS Gennell How I, NP       traZODone (DESYREL) tablet 50 mg  50 mg Oral QHS PRN Briant Cedar, MD   50 mg at 01/02/22 2329   PTA Medications: No medications prior to admission.   Musculoskeletal: Strength & Muscle Tone: within normal limits Gait & Station: normal Patient leans: N/A  Psychiatric Specialty Exam:  Presentation  General Appearance: Disheveled; Casual  Eye Contact:Minimal  Speech:Slow; Clear and Coherent  Speech Volume:Decreased  Handedness:No data recorded  Mood and Affect  Mood:-- (Psychotic)  Affect:Congruent; Restricted  Thought Process  Thought Processes:Goal Directed; Coherent  Duration of Psychotic Symptoms: Greater than six months  Past Diagnosis of Schizophrenia or Psychoactive disorder: Yes  Descriptions of Associations:Intact  Orientation:Full (Time, Place and Person)  Thought Content:Paranoid Ideation; Delusions  Hallucinations:Hallucinations: None Description of Command Hallucinations: NA Description of Auditory Hallucinations: NA  Ideas of Reference:Delusions; Paranoia  Suicidal Thoughts:Suicidal Thoughts: No SI Active Intent and/or Plan: Without Intent; Without Plan; Without Means to Carry Out; Without Access to Means  Homicidal Thoughts:Homicidal Thoughts: No  Sensorium  Memory:Immediate Fair; Recent Fair; Remote Fair  Judgment:Fair  Insight:Fair  Executive Functions  Concentration:Fair  Attention Span:Fair  [place]  Psychomotor Activity  Psychomotor Activity:Psychomotor Activity: Decreased  Assets  Assets:Communication Skills; Desire for Improvement; Housing; Resilience; Physical Health; Social Support  Sleep  Sleep:Sleep: Fair Number of Hours of Sleep: 5  Physical Exam: Physical Exam Vitals and nursing note reviewed.  HENT:     Head: Normocephalic.     Nose: Nose  normal.     Mouth/Throat:     Pharynx: Oropharynx is clear.  Eyes:     Pupils: Pupils are equal, round, and reactive to light.  Cardiovascular:     Rate and Rhythm: Normal rate.     Pulses: Normal pulses.  Pulmonary:     Effort: Pulmonary effort is normal.  Genitourinary:    Comments: Deferred Musculoskeletal:        General: Normal range of motion.     Cervical back: Normal range of motion.  Skin:    General: Skin is warm and dry.  Neurological:     Mental Status: He is alert.   Review of Systems  Constitutional:  Negative for chills, diaphoresis, fever and malaise/fatigue.  HENT:  Negative for congestion and sore throat.   Eyes:  Negative for blurred vision.  Respiratory:  Negative for cough, shortness of breath and wheezing.   Cardiovascular:  Negative for chest pain.  Gastrointestinal:  Negative for abdominal pain, constipation, diarrhea, heartburn, nausea and vomiting.  Genitourinary:  Negative for dysuria.  Musculoskeletal:  Negative for joint pain and myalgias.  Blood pressure 133/78, pulse 78, temperature 98.8 F (37.1 C), temperature source Oral, resp. rate 18, height 6' 1.23" (1.86 m), weight 94.1 kg, SpO2 98 %. Body mass index is 27.19 kg/m.  Treatment Plan Summary: Daily contact with patient to assess and evaluate symptoms and progress in treatment and Medication management.  Treatment Plan/Recommendations: 1. Admit for crisis management and stabilization, estimated length of stay 3-5 days.  Schizophrenia.   Plan: 2. Medication management to reduce current symptoms to base line and improve the patient's overall level of functioning: See [facility] for plan of care. -Initiated Zyprexa 2.5 mg once today. -Zyprexa 5 mg po Q bedtime for Schizophrenia.  -Continue Trazodone 50 mg po Q hs prn for insomnia. -Continue the agitation protocol as recommended prn for agitation/psychosis. -Continue all other prn medications as recommended for anxiety, pain/fever, indigestion,  constipation.  3. Treat health problems as indicated.  4. Develop treatment plan to decrease risk & the need for readmission.  5. Psycho-social education regarding self care/maintaining mood stability..  6. Health care follow up as needed for medical problems.  7. Review, reconcile, and reinstate any pertinent home medications for other health issues where appropriate. 8. Call for consults with hospitalist for any additional specialty patient care services as needed.   Observation Level/Precautions:  15 minute checks  Laboratory:   Reviewed current lab results.  Psychotherapy: Group milieu.  Medications: See [facility]   Consultations: As needed.   Discharge Concerns: Safety, mood stability  Estimated LOS: 5-7 days  Other: Admit to the [address]-hall.    Physician Treatment Plan for Primary Diagnosis: Schizoaffective disorder ([place]) Long Term Goal(s): Improvement in symptoms so as ready for discharge  Short Term Goals: Ability to identify changes in lifestyle to reduce recurrence of condition will improve, Ability to verbalize feelings will improve, Ability to disclose and discuss suicidal ideas, and Ability to demonstrate self-control will improve  Physician Treatment Plan for Secondary Diagnosis: Principal Problem:   Schizoaffective disorder ([place])  Long Term Goal(s): Improvement in symptoms so as ready for discharge  Short Term Goals: Ability to identify and develop effective coping behaviors will improve, Ability to maintain clinical measurements within normal limits will improve, and Compliance with prescribed medications will improve  I certify that inpatient services furnished can reasonably be expected to improve the patient's condition.    Lindell Spar, NP, pmhnp, fnp-bc 1/4/202312:39 PM

## 2022-01-03 NOTE — Group Note (Signed)
Recreation Therapy Group Note   Group Topic:Other  Group Date: 01/03/2022 Start Time: 0955 End Time: 1030 Facilitators: Caroll Rancher, LRT,CTRS Location: [address] Dayroom   Goal Area(s) Addresses:  Patient will identify what triggers them. Patient will identify how to deal with triggers. Patient will identify how triggers can be managed post d/c.  Group Description: Triggers.  Patients were given a worksheet to identify what triggers were.  Patients then identified what things triggers them.  Patients also identified how to avoid triggers and how to deal with triggers head on. Patients would also express how they would manage triggers post d/c.   Affect/Mood: N/A   Participation Level: Did not attend    Clinical Observations/Individualized Feedback:     Plan: Continue to engage patient in RT group sessions 2-3x/week.   Caroll Rancher, LRT,CTRS 01/03/2022 12:52 PM

## 2022-01-03 NOTE — BHH Suicide Risk Assessment (Signed)
[facility] Admission Suicide Risk Assessment   Nursing information obtained from:  Patient Demographic factors:  Male Current Mental Status:  Self-harm thoughts, Suicide plan, Suicidal ideation indicated by patient, Plan to harm others, Thoughts of violence towards others Loss Factors:  Loss of significant relationship Historical Factors:  Impulsivity, Prior suicide attempts, Family history of mental illness or substance abuse Risk Reduction Factors:  Positive social support  Total Time spent with patient: 30 minutes Principal Problem: Schizophrenia, paranoid (HCC) Diagnosis:  Principal Problem:   Schizophrenia, paranoid (HCC) Active Problems:   Schizoaffective disorder (HCC)  Subjective Data:   Pt is a 26 year old Caucasian male with hx of mental health issues (Schizophrenia/bipolar disorder). He walked-in to the [facility] yesterday with complaint of command auditory hallucinations telling him to do things & other times, the voices do sound repetitive such as think, think that. He reported that his hallucination is more or less a distorted reality of particles that seem different than what they are. After medical screening by one of the [facility] resident doctors, he was recommended for inpatient admission for mood stabilization treatments.  Pt is having active command AH to harm himself. He reports having passive SI without intent or plan. Denies h/o suicide attempt. He reports having passive HI without intent, plan, or specific target. He denies that AH tell him to harm others.  He has extensive psych history with multiple medication trials and hospitalizations. He is non adherent with psych meds as outpatient, for about 1 year.   Denies pervasive sadness for 2 weeks. Reports anhedonia on and off.  Pt agreeable with starting zyprexa now and considering adding fluoxetine in the future.   Continued Clinical Symptoms:  Alcohol Use Disorder Identification Test Final Score (AUDIT): 0 The "Alcohol Use  Disorders Identification Test", Guidelines for Use in Primary Care, Second Edition.  World Science writer [facility]). Score between 0-7:  no or low risk or alcohol related problems. Score between 8-15:  moderate risk of alcohol related problems. Score between 16-19:  high risk of alcohol related problems. Score 20 or above:  warrants further diagnostic evaluation for alcohol dependence and treatment.   CLINICAL FACTORS:   Bipolar Disorder:   Depressive phase Schizophrenia:   Command hallucinatons Depressive state Less than 80 years old More than one psychiatric diagnosis Currently Psychotic Previous Psychiatric Diagnoses and Treatments   Musculoskeletal: Strength & Muscle Tone: pt laying in bed Gait & Station: pt laying in bed Patient leans: pt laying in bed  Psychiatric Specialty Exam:  Presentation  General Appearance: Disheveled  Eye Contact:Poor  Speech:Blocked  Speech Volume:Decreased  Handedness:No data recorded  Mood and Affect  Mood:Dysphoric  Affect:Blunt; Flat   Thought Process  Thought Processes:Coherent  Descriptions of Associations:Intact  Orientation:Full (Time, Place and Person)  Thought Content:Paranoid Ideation  History of Schizophrenia/Schizoaffective disorder:Yes  Duration of Psychotic Symptoms:Greater than six months  Hallucinations:Hallucinations: Auditory; Command Description of Command Hallucinations: AH tell him to hurt self Description of Auditory Hallucinations: NA  Ideas of Reference:Delusions; Paranoia  Suicidal Thoughts:Suicidal Thoughts: Yes, Passive SI Active Intent and/or Plan: Without Intent; Without Plan  Homicidal Thoughts:Homicidal Thoughts: Yes, Passive HI Passive Intent and/or Plan: Without Intent; Without Plan (without target)   Sensorium  Memory:Immediate Fair; Recent Fair; Remote Fair  Judgment:Fair  Insight:Fair   Executive Functions  Concentration:Poor  Attention Span:Poor  Recall:Fair  Fund  of Knowledge:Fair  Language:Fair   Psychomotor Activity  Psychomotor Activity:Psychomotor Activity: Decreased   Assets  Assets:Communication Skills; Desire for Improvement; Housing; Resilience; Physical Health;  Social Support   Sleep  Sleep:Sleep: Fair Number of Hours of Sleep: 5    Physical Exam: Physical Exam See H&P  ROS See H&P  Blood pressure 133/78, pulse 78, temperature 98.8 F (37.1 C), temperature source Oral, resp. rate 18, height 6' 1.23" (1.86 m), weight 94.1 kg, SpO2 98 %. Body mass index is 27.19 kg/m.   COGNITIVE FEATURES THAT CONTRIBUTE TO RISK:  Thought constriction (tunnel vision)    SUICIDE RISK:   Moderate:  Frequent suicidal ideation with limited intensity, and duration, some specificity in terms of plans, no associated intent, good self-control, limited dysphoria/symptomatology, some risk factors present, and identifiable protective factors, including available and accessible social support.  PLAN OF CARE:   See H&P   I certify that inpatient services furnished can reasonably be expected to improve the patient's condition.   Cristy Hilts, MD 01/03/2022, 4:52 PM

## 2022-01-03 NOTE — Progress Notes (Signed)
°   01/03/22 0500  Sleep  Number of Hours 5

## 2022-01-03 NOTE — BH IP Treatment Plan (Signed)
Interdisciplinary Treatment and Diagnostic Plan Update  01/03/2022 Time of Session: 9:40am  Miguel Shea MRN: [id number]  Principal Diagnosis: Schizophrenia, paranoid ([place])  Secondary Diagnoses: Principal Problem:   Schizophrenia, paranoid ([place]) Active Problems:   Schizoaffective disorder ([place])   Current Medications:  Current Facility-Administered Medications  Medication Dose Route Frequency Provider Last Rate Last Admin   acetaminophen (TYLENOL) tablet 650 mg  650 mg Oral Q6H PRN Briant Cedar, MD       alum & mag hydroxide-simeth (MAALOX/MYLANTA) 200-200-20 MG/5ML suspension 30 mL  30 mL Oral Q4H PRN Briant Cedar, MD       hydrOXYzine (ATARAX) tablet 25 mg  25 mg Oral TID PRN Briant Cedar, MD   25 mg at 01/03/22 0833   [START ON 01/04/2022] influenza vac split quadrivalent PF (FLUARIX) injection 0.5 mL  0.5 mL Intramuscular Tomorrow-1000 [name], [name], PA-C       OLANZapine zydis (ZYPREXA) disintegrating tablet 5 mg  5 mg Oral Q8H PRN Briant Cedar, MD   5 mg at 01/03/22 2993   And   LORazepam (ATIVAN) tablet 1 mg  1 mg Oral PRN Briant Cedar, MD       And   ziprasidone (GEODON) injection 20 mg  20 mg Intramuscular PRN Briant Cedar, MD       magnesium hydroxide (MILK OF MAGNESIA) suspension 30 mL  30 mL Oral Daily PRN Briant Cedar, MD       OLANZapine (ZYPREXA) tablet 5 mg  5 mg Oral QHS [name], [name], NP       traZODone (DESYREL) tablet 50 mg  50 mg Oral QHS PRN Briant Cedar, MD   50 mg at 01/02/22 2329   PTA Medications: No medications prior to admission.    Patient Stressors: Marital or family conflict   Medication change or noncompliance    Patient Strengths: Motivation for treatment/growth  Supportive family/friends   Treatment Modalities: Medication Management, Group therapy, Case management,  1 to 1 session with clinician, Psychoeducation, Recreational therapy.   Physician Treatment Plan for  Primary Diagnosis: Schizophrenia, paranoid ([place]) Long Term Goal(s): Improvement in symptoms so as ready for discharge   Short Term Goals: Ability to identify and develop effective coping behaviors will improve Ability to maintain clinical measurements within normal limits will improve Compliance with prescribed medications will improve Ability to identify changes in lifestyle to reduce recurrence of condition will improve Ability to verbalize feelings will improve Ability to disclose and discuss suicidal ideas Ability to demonstrate self-control will improve  Medication Management: Evaluate patient's response, side effects, and tolerance of medication regimen.  Therapeutic Interventions: 1 to 1 sessions, Unit Group sessions and Medication administration.  Evaluation of Outcomes: Not Met  Physician Treatment Plan for Secondary Diagnosis: Principal Problem:   Schizophrenia, paranoid ([place]) Active Problems:   Schizoaffective disorder ([place])  Long Term Goal(s): Improvement in symptoms so as ready for discharge   Short Term Goals: Ability to identify and develop effective coping behaviors will improve Ability to maintain clinical measurements within normal limits will improve Compliance with prescribed medications will improve Ability to identify changes in lifestyle to reduce recurrence of condition will improve Ability to verbalize feelings will improve Ability to disclose and discuss suicidal ideas Ability to demonstrate self-control will improve     Medication Management: Evaluate patient's response, side effects, and tolerance of medication regimen.  Therapeutic Interventions: 1 to 1 sessions, Unit Group sessions and Medication administration.  Evaluation of Outcomes: Not Met  RN Treatment Plan for Primary Diagnosis: Schizophrenia, paranoid ([name]) Long Term Goal(s): Knowledge of disease and therapeutic regimen to maintain health will improve  Short Term Goals: Ability to  remain free from injury will improve, Ability to participate in decision making will improve, Ability to verbalize feelings will improve, Ability to disclose and discuss suicidal ideas, and Ability to identify and develop effective coping behaviors will improve  Medication Management: RN will administer medications as ordered by provider, will assess and evaluate patient's response and provide education to patient for prescribed medication. RN will report any adverse and/or side effects to prescribing provider.  Therapeutic Interventions: 1 on 1 counseling sessions, Psychoeducation, Medication administration, Evaluate responses to treatment, Monitor vital signs and CBGs as ordered, Perform/monitor CIWA, COWS, AIMS and Fall Risk screenings as ordered, Perform wound care treatments as ordered.  Evaluation of Outcomes: Not Met   LCSW Treatment Plan for Primary Diagnosis: Schizophrenia, paranoid ([name]) Long Term Goal(s): Safe transition to appropriate next level of care at discharge, Engage patient in therapeutic group addressing interpersonal concerns.  Short Term Goals: Engage patient in aftercare planning with referrals and resources, Increase social support, Increase emotional regulation, Facilitate acceptance of mental health diagnosis and concerns, Identify triggers associated with mental health/substance abuse issues, and Increase skills for wellness and recovery  Therapeutic Interventions: Assess for all discharge needs, 1 to 1 time with Social worker, Explore available resources and support systems, Assess for adequacy in community support network, Educate family and significant other(s) on suicide prevention, Complete Psychosocial Assessment, Interpersonal group therapy.  Evaluation of Outcomes: Not Met   Progress in Treatment: Attending groups: Yes. Participating in groups: Yes. Taking medication as prescribed: Yes. Toleration medication: Yes. Family/Significant other contact made: Yes,  individual(s) contacted:  Mother Patient understands diagnosis: No. Discussing patient identified problems/goals with staff: Yes. Medical problems stabilized or resolved: Yes. Denies suicidal/homicidal ideation: Yes. Issues/concerns per patient self-inventory: No.   New problem(s) identified: No, Describe:  None   New Short Term/Long Term Goal(s): medication stabilization, elimination of SI thoughts, development of comprehensive mental wellness plan.   Patient Goals: "To get on medications"   Discharge Plan or Barriers: Patient recently admitted. CSW will continue to follow and assess for appropriate referrals and possible discharge planning.   Reason for Continuation of Hospitalization: Anxiety Hallucinations Medication stabilization Suicidal ideation  Estimated Length of Stay: 3 to 5 days    Scribe for Treatment Team: Darleen Crocker, Latanya Presser 01/03/2022 2:56 PM

## 2022-01-03 NOTE — Group Note (Signed)
LCSW Group Therapy Notes  ° °Date and Time: 01/03/2022 1:00pm ° °Type of Therapy and Topic: Group Therapy: Worry and Anxiety ° °Participation Level: B[facility] PARTICIPATION LEVEL: Did Not Attend ° °Description of Group: °In this group, patients will be encouraged to explore their worry around what could happen vs what will happen. Each patient will be challenged to think of personal worries and how they will work their way through that worry around what will happen and what could happen. This group will be process-oriented, with patients participating in exploration of their own experiences as well as giving and receiving support and challenge from other group members. ° °Therapeutic Goals: °Patient will identify personal worries that cause anxiety. °Patient will identify clues to identify their worry. °Patient will identify ways to handle their worry. °Patient will discuss ways that their worry has deceased or why it has not decreased.  ° °Summary of Patient Progress: Did not attend ° ° °Therapeutic Modalities: ° °Cognitive Behavioral Therapy °Solution Focused Therapy °Motivational Interviewing ° ° ° °Dechelle Attaway MSW, LCSW °Clincal Social Worker  °[facility]  °

## 2022-01-03 NOTE — Plan of Care (Signed)
Progress note  Pt found in bed. Pt requested prn medication once they awoke. Pt was restless, pacing, and anxious. Pt provided medication. Pt stated this helped. Pt was seen attending group. Pt was minimal and guarded on approach and continues to be with other staff as well. Pt is disheveled. Self care promoted. Pt denies si/hi/ah/vh and verbally agrees to approach staff if these become apparent or before harming themselves/others while at bhh.  A: Pt provided support and encouragement. Pt given medication per protocol and standing orders. Q25m safety checks implemented and continued.  R: Pt safe on the unit. Will continue to monitor.  Problem: Education: Goal: Knowledge of [facility] Education information/materials will improve Outcome: Progressing Goal: Emotional status will improve Outcome: Progressing Goal: Mental status will improve Outcome: Progressing
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
## 2022-01-03 NOTE — Progress Notes (Signed)
Psychoeducational Group Note  Date:  01/03/2022 Time:  2114  Group Topic/Focus:  Wrap-Up Group:   The focus of this group is to help patients review their daily goal of treatment and discuss progress on daily workbooks.  Participation Level: Did Not Attend  Participation Quality:  Not Applicable  Affect:  Not Applicable  Cognitive:  Not Applicable  Insight:  Not Applicable  Engagement in Group: Not Applicable  Additional Comments:  The patient did not attend group.   Archie Balboa S 01/03/2022, 9:14 PM

## 2022-01-04 DIAGNOSIS — F200 Paranoid schizophrenia: Secondary | ICD-10-CM | POA: Diagnosis not present

## 2022-01-04 LAB — CBC
HCT: 48.3 % (ref 39.0–52.0)
Hemoglobin: 16.9 g/dL (ref 13.0–17.0)
MCH: 32.1 pg (ref 26.0–34.0)
MCHC: 35 g/dL (ref 30.0–36.0)
MCV: 91.7 fL (ref 80.0–100.0)
Platelets: 309 10*3/uL (ref 150–400)
RBC: 5.27 MIL/uL (ref 4.22–5.81)
RDW: 12.1 % (ref 11.5–15.5)
WBC: 9 10*3/uL (ref 4.0–10.5)
nRBC: 0 % (ref 0.0–0.2)

## 2022-01-04 LAB — LIPID PANEL
Cholesterol: 148 mg/dL (ref 0–200)
HDL: 46 mg/dL (ref >40)
LDL Cholesterol: 81 mg/dL (ref 0–99)
Total CHOL/HDL Ratio: 3.2 RATIO
Triglycerides: 104 mg/dL (ref <150)
VLDL: 21 mg/dL (ref 0–40)

## 2022-01-04 LAB — COMPREHENSIVE METABOLIC PANEL
ALT: 20 U/L (ref 0–44)
AST: 21 U/L (ref 15–41)
Albumin: 4.6 g/dL (ref 3.5–5.0)
Alkaline Phosphatase: 60 U/L (ref 38–126)
Anion gap: 10 (ref 5–15)
BUN: 13 mg/dL (ref 6–20)
CO2: 23 mmol/L (ref 22–32)
Calcium: 9.3 mg/dL (ref 8.9–10.3)
Chloride: 108 mmol/L (ref 98–111)
Creatinine, Ser: 1.02 mg/dL (ref 0.61–1.24)
GFR, Estimated: >60 mL/min (ref >60)
Glucose, Bld: 116 mg/dL — ABNORMAL HIGH (ref 70–99)
Potassium: 3.5 mmol/L (ref 3.5–5.1)
Sodium: 141 mmol/L (ref 135–145)
Total Bilirubin: 0.9 mg/dL (ref 0.3–1.2)
Total Protein: 7.2 g/dL (ref 6.5–8.1)

## 2022-01-04 LAB — HEMOGLOBIN A1C
Hgb A1c MFr Bld: 4.6 % — ABNORMAL LOW (ref 4.8–5.6)
Mean Plasma Glucose: 85.32 mg/dL

## 2022-01-04 LAB — TSH: TSH: 1.942 u[IU]/mL (ref 0.350–4.500)

## 2022-01-04 MED ORDER — OLANZAPINE 5 MG PO TBDP
5.0000 mg | ORAL_TABLET | Freq: Once | ORAL | Status: AC
Start: 2022-01-04 — End: 2022-01-04
  Administered 2022-01-04: 5 mg via ORAL
  Filled 2022-01-04: qty 1

## 2022-01-04 MED ORDER — FLUOXETINE HCL 20 MG PO CAPS
20.0000 mg | ORAL_CAPSULE | Freq: Every day | ORAL | Status: DC
  Administered 2022-01-05 – 2022-01-09 (×5): 20 mg via ORAL
  Filled 2022-01-04 (×7): qty 1

## 2022-01-04 MED ORDER — FLUOXETINE HCL 10 MG PO CAPS
10.0000 mg | ORAL_CAPSULE | Freq: Once | ORAL | Status: AC
  Administered 2022-01-04: 10 mg via ORAL
  Filled 2022-01-04: qty 1

## 2022-01-04 MED ORDER — OLANZAPINE 10 MG PO TABS
10.0000 mg | ORAL_TABLET | Freq: Every day | ORAL | Status: DC
  Administered 2022-01-04: 10 mg via ORAL
  Filled 2022-01-04 (×3): qty 1

## 2022-01-04 NOTE — [facility] Suicide Risk Assessment (Signed)
B[facility] INPATIENT:  Family/Significant Other Suicide Prevention Education  Suicide Prevention Education:  Contact Attempts:  Mother Morocco Gipe [phone]) has been identified by the patient as the family member/significant other with whom the patient will be residing, and identified as the person(s) who will aid the patient in the event of a mental health crisis.  With written consent from the patient, two attempts were made to provide suicide prevention education, prior to and/or following the patient's discharge.  We were unsuccessful in providing suicide prevention education.  A suicide education pamphlet was given to the patient to share with family/significant other.  Date and time of first attempt:01/04/2022 / 1:27PM CSW unable to leave voicemail for this pt's mother due to phone ringing and ringing. No voicemail options available.  Date and time of second attempt: Second attempt still needs to be made.   Monike Bragdon A Sheryn Aldaz 01/04/2022, 1:28 PM

## 2022-01-04 NOTE — Progress Notes (Signed)
Pt was encouraged but didn't attend orientation/goals group. ?

## 2022-01-04 NOTE — Progress Notes (Signed)
Psychoeducational Group Note  Date:  01/04/2022 Time:  2018  Group Topic/Focus:  Wrap-Up Group:   The focus of this group is to help patients review their daily goal of treatment and discuss progress on daily workbooks.  Participation Level: Did Not Attend  Participation Quality:  Not Applicable  Affect:  Not Applicable  Cognitive:  Not Applicable  Insight:  Not Applicable  Engagement in Group: Not Applicable  Additional Comments:  The patient did not attend group this evening.   Hazle Coca S 01/04/2022, 8:18 PM

## 2022-01-04 NOTE — Group Note (Signed)
Occupational Therapy Group Note ° °Group Topic:Feelings Management  °Group Date: 01/04/2022 °Start Time: 1400 °End Time: 1435 °Facilitators: Delailah Spieth, OT  ° ° °Group Description: Group encouraged increased engagement and participation through discussion focused on Building Happiness. Patients were provided a handout and reviewed therapeutic strategies to build happiness including identifying gratitudes, random acts of kindness, exercise, meditation, positive journaling, and fostering relationships. Patients engaged in discussion and encouraged to reflect on each strategy and their experiences. ° °Therapeutic Goal(s): °Identify strategies to build happiness. °Identify and implement therapeutic strategies to improve overall mood. °Practice and identify gratitudes, random acts of kindness, exercise, meditation, positive journaling, and fostering relationships ° ° °Participation Level: Did not attend °  °Plan: Continue to engage patient in OT groups 2 - 3x/week. ° ° °01/04/2022  °Trevor Wilkie, MOT, OTR/L °

## 2022-01-04 NOTE — Progress Notes (Signed)
Recreation Therapy Notes  INPATIENT RECREATION THERAPY ASSESSMENT  Patient Details Name: Miguel Shea MRN: [id number] DOB: 1996-01-18 Today's Date: 01/04/2022       Information Obtained From: Patient  Able to Participate in Assessment/Interview: Yes  Patient Presentation: Anxious (Pacing floor back and forth)  Reason for Admission (Per Patient): Other (Comments) (schizophrenic episode)  Patient Stressors: Relationship, Work  Pharmacologist:   Film/video editor, TV, Arguments, Music, Talk, Art, Avoidance, Read, Hot Bath/Shower  Leisure Interests (2+):  Exercise - Walking  Frequency of Recreation/Participation: Other (Comment) (Daily)  Awareness of Community Resources:  Yes  Community Resources:  Other (Comment) (Pt did not specify)  Current Use: No  If no, Barriers?: Other (Comment) (Pt stated he stays home)  Expressed Interest in [employer] Information: No  [place] of Residence:  [place]  Patient Main Form of Transportation: Car  Patient Strengths:  "nothing really"  Patient Identified Areas of Improvement:  "personal hygiene"  Patient Goal for Hospitalization:  "get better"  Current SI (including self-harm):  Yes (Rated an 8; contracts for safety)  Current HI:  No  Current AVH: Yes (Hearing voices instruct him on what's going on)  Staff Intervention Plan: Group Attendance, Collaborate with Interdisciplinary Treatment Team  Consent to Intern Participation: N/A    Caroll Rancher, Richardean Sale, Treston Coker A 01/04/2022, 12:30 PM

## 2022-01-04 NOTE — Progress Notes (Signed)
°   01/04/22 0500  Sleep  Number of Hours 9

## 2022-01-04 NOTE — Progress Notes (Signed)
[facility] MD Progress Note  01/04/2022 4:22 PM Miguel Shea  MRN:  [id number]  Subjective: Miguel Shea reports, "I'm having some kind of episode like paranoia. My depression comes & goes, but my anxiety is high due to the paranoia". Daily notes 01-04-22: Miguel Shea is seen in his room. He is lying down in bed. He presents with a flat/restricted affect. He is verbally responsive, making a minimal eye contact. He reports having some kind of episode like paranoia. He says his depression comes & goes as result requested Zyprexa shot because of worsening paranoia. He says his Schizophrenia is very bad today. He admits hearing voices telling him to do this, do that like hurt himself. Miguel Shea says he is not going hurt or harm himself. He says he feel safe here. He is instructed & encouraged to come to the staff whenever he feels unsafe or the voices worsen. He was able to contract for safety verbally. Miguel Shea did ask for Prozac to be initiated. He says the last time he was on it, helped get him motivated. We have adjusted his Zyprexa dose to meet his needs. He was given some doses of Zyprexa this am for psychosis. We initiated Prozac for depression. He rates depression #4 & anxiety #6. He continues to pace up & down the unit hall way from time. There are no behavioral issues displayed or reported by staff. Miguel Shea is encouraged to take his medication as recommended & to be patient to allow the medications to get in his system as he was off his medications for a long time. He current denies any VH, HI or delusional thinking. He denies any side effects from his medications. Reviewed vital signs (stable). Labs orders awaiting to be drawn include: CBC, CMP, HGBa1c, Lipid panel, TSH. Reminded the nurses to obtain ekg.  Reason for admission: 26 year old Caucasian male with hx of mental health issues (Schizophrenia/bipolar disorder). [facility] with complaint of command auditory hallucinations telling him to do  things & other times, the voices do sound repetitive such as think, think that. He reported that his hallucination is more or less a distorted reality of particles that seem different than what they are.  Principal Problem: Schizophrenia, paranoid (Miguel Shea)  Diagnosis: Principal Problem:   Schizophrenia, paranoid (Miguel Shea) Active Problems:   Schizoaffective disorder (Miguel Shea)  Total Time spent with patient:  35 minutes  Past Psychiatric History: Schizophrenia.  Past Medical History:  Past Medical History:  Diagnosis Date   Bipolar 1 disorder (Miguel Shea)    Wolff-Parkinson-White syndrome    History reviewed. No pertinent surgical history.  Family History: History reviewed. No pertinent family history.  Family Psychiatric  History: See H&P.  Social History:  Social History   Substance and Sexual Activity  Alcohol Use Never     Social History   Substance and Sexual Activity  Drug Use Never    Social History   Socioeconomic History   Marital status: Single    Spouse name: Not on file   Number of children: Not on file   Years of education: Not on file   Highest education level: Not on file  Occupational History   Not on file  Tobacco Use   Smoking status: Never   Smokeless tobacco: Never  Vaping Use   Vaping Use: Some days   Substances: Nicotine, Flavoring  Substance and Sexual Activity   Alcohol use: Never   Drug use: Never   Sexual activity: Not on file  Other Topics Concern  Not on file  Social History Narrative   ** Merged History Encounter **       Social Determinants of Health   Financial Resource Strain: Not on file  Food Insecurity: Not on file  Transportation Needs: Not on file  Physical Activity: Not on file  Stress: Not on file  Social Connections: Not on file   Additional Social History:   Sleep: Good  Appetite:  Good  Current Medications: Current Facility-Administered Medications  Medication Dose Route Frequency Provider Last Rate Last Admin    acetaminophen (TYLENOL) tablet 650 mg  650 mg Oral Q6H PRN Briant Cedar, MD       alum & mag hydroxide-simeth (MAALOX/MYLANTA) 200-200-20 MG/5ML suspension 30 mL  30 mL Oral Q4H PRN Briant Cedar, MD       [START ON 01/05/2022] FLUoxetine (PROZAC) capsule 20 mg  20 mg Oral Daily Anuja Manka I, NP       hydrOXYzine (ATARAX) tablet 25 mg  25 mg Oral TID PRN Briant Cedar, MD   25 mg at 01/04/22 1047   OLANZapine zydis (ZYPREXA) disintegrating tablet 5 mg  5 mg Oral Q8H PRN Briant Cedar, MD   5 mg at 01/04/22 1119   And   LORazepam (ATIVAN) tablet 1 mg  1 mg Oral PRN Briant Cedar, MD       And   ziprasidone (GEODON) injection 20 mg  20 mg Intramuscular PRN Briant Cedar, MD       magnesium hydroxide (MILK OF MAGNESIA) suspension 30 mL  30 mL Oral Daily PRN Briant Cedar, MD       OLANZapine (ZYPREXA) tablet 10 mg  10 mg Oral QHS Lindell Spar I, NP       traZODone (DESYREL) tablet 50 mg  50 mg Oral QHS PRN Briant Cedar, MD   50 mg at 01/03/22 2045   Lab Results:  Results for orders placed or performed during the hospital encounter of 01/02/22 (from the past 48 hour(s))  Comprehensive metabolic panel     Status: Abnormal   Collection Time: 01/02/22  6:12 PM  Result Value Ref Range   Sodium 135 135 - 145 mmol/L   Potassium 3.4 (L) 3.5 - 5.1 mmol/L   Chloride 104 98 - 111 mmol/L   CO2 24 22 - 32 mmol/L   Glucose, Bld 98 70 - 99 mg/dL    Comment: Glucose reference range applies only to samples taken after fasting for at least 8 hours.   BUN 11 6 - 20 mg/dL   Creatinine, Ser 0.69 0.61 - 1.24 mg/dL   Calcium 9.4 8.9 - 10.3 mg/dL   Total Protein 7.7 6.5 - 8.1 g/dL   Albumin 5.0 3.5 - 5.0 g/dL   AST 23 15 - 41 U/L   ALT 22 0 - 44 U/L   Alkaline Phosphatase 59 38 - 126 U/L   Total Bilirubin 1.9 (H) 0.3 - 1.2 mg/dL   GFR, Estimated >60 >60 mL/min    Comment: (NOTE) Calculated using the CKD-EPI Creatinine Equation (2021)     Anion gap 7 5 - 15    Comment: Performed at [facility], [place] [address], [place], [place] [address]  Ethanol     Status: None   Collection Time: 01/02/22  6:12 PM  Result Value Ref Range   Alcohol, Ethyl (B) <10 <10 mg/dL    Comment: (NOTE) Lowest detectable limit for serum alcohol is 10 mg/dL.  For medical  purposes only. Performed at [facility], [place] [address] [place], [place] [id number]   Salicylate level     Status: Abnormal   Collection Time: 01/02/22  6:12 PM  Result Value Ref Range   Salicylate Lvl Q000111Q (L) 7.0 - 30.0 mg/dL    Comment: Performed at [facility], [place] [address] [place], [place] [address]  Acetaminophen level     Status: Abnormal   Collection Time: 01/02/22  6:12 PM  Result Value Ref Range   Acetaminophen (Tylenol), Serum <10 (L) 10 - 30 ug/mL    Comment: (NOTE) Therapeutic concentrations vary significantly. A range of 10-30 ug/mL  may be an effective concentration for many patients. However, some  are best treated at concentrations outside of this range. Acetaminophen concentrations >150 ug/mL at 4 hours after ingestion  and >50 ug/mL at 12 hours after ingestion are often associated with  toxic reactions.  Performed at [facility], [place] [address], [place], [place] [address]   cbc     Status: Abnormal   Collection Time: 01/02/22  6:12 PM  Result Value Ref Range   WBC 13.0 (H) 4.0 - 10.5 K/uL   RBC 5.09 4.22 - 5.81 MIL/uL   Hemoglobin 15.6 13.0 - 17.0 g/dL   HCT 45.2 39.0 - 52.0 %   MCV 88.8 80.0 - 100.0 fL   MCH 30.6 26.0 - 34.0 pg   MCHC 34.5 30.0 - 36.0 g/dL   RDW 12.0 11.5 - 15.5 %   Platelets 277 150 - 400 K/uL   nRBC 0.0 0.0 - 0.2 %    Comment: Performed at [facility], [place] [address] [place], [place] [address]  Rapid urine drug screen (hospital performed)     Status: None   Collection Time: 01/02/22  7:52 PM  Result Value Ref Range    Opiates NONE DETECTED NONE DETECTED   Cocaine NONE DETECTED NONE DETECTED   Benzodiazepines NONE DETECTED NONE DETECTED   Amphetamines NONE DETECTED NONE DETECTED   Tetrahydrocannabinol NONE DETECTED NONE DETECTED   Barbiturates NONE DETECTED NONE DETECTED    Comment: (NOTE) DRUG SCREEN FOR MEDICAL PURPOSES ONLY.  IF CONFIRMATION IS NEEDED FOR ANY PURPOSE, NOTIFY LAB WITHIN 5 DAYS.  LOWEST DETECTABLE LIMITS FOR URINE DRUG SCREEN Drug Class                     Cutoff (ng/mL) Amphetamine and metabolites    1000 Barbiturate and metabolites    200 Benzodiazepine                 [id number] Tricyclics and metabolites     300 Opiates and metabolites        300 Cocaine and metabolites        300 THC                            50 Performed at [facility], [place] [address], [place],  [address]    Blood Alcohol level:  Lab Results  Component Value Date   ETH <10 01/02/2022   ETH <10 123XX123   Metabolic Disorder Labs: No results found for: HGBA1C, MPG No results found for: PROLACTIN No results found for: CHOL, TRIG, HDL, CHOLHDL, VLDL, LDLCALC  Physical Findings: AIMS: Facial and Oral Movements Muscles of Facial Expression: None, normal Lips and Perioral Area: None, normal Jaw: None, normal Tongue: None, normal,Extremity Movements Upper (arms, wrists,  hands, fingers): None, normal Lower (legs, knees, ankles, toes): None, normal, Trunk Movements Neck, shoulders, hips: None, normal, Overall Severity Severity of abnormal movements (highest score from questions above): None, normal Incapacitation due to abnormal movements: None, normal Patient's awareness of abnormal movements (rate only patient's report): No Awareness, Dental Status Current problems with teeth and/or dentures?: No Does patient usually wear dentures?: No  CIWA:    COWS:     Musculoskeletal: Strength & Muscle Tone: within normal limits Gait & Station: normal Patient leans:  N/A  Psychiatric Specialty Exam:  Presentation  General Appearance: Disheveled  Eye Contact:Poor  Speech:Blocked  Speech Volume:Decreased  Handedness:No data recorded  Mood and Affect  Mood:Dysphoric  Affect:Blunt; Flat  Thought Process  Thought Processes:Coherent  Descriptions of Associations:Intact  Orientation:Full (Time, Place and Person)  Thought Content:Paranoid Ideation  History of Schizophrenia/Schizoaffective disorder:Yes  Duration of Psychotic Symptoms:Greater than six months  Hallucinations:Hallucinations: Auditory; Command Description of Command Hallucinations: AH tell him to hurt self Description of Auditory Hallucinations: NA  Ideas of Reference:Delusions; Paranoia  Suicidal Thoughts:Suicidal Thoughts: Yes, Passive SI Active Intent and/or Plan: Without Intent; Without Plan  Homicidal Thoughts:Homicidal Thoughts: Yes, Passive HI Passive Intent and/or Plan: Without Intent; Without Plan (without target)  Sensorium  Memory:Immediate Fair; Recent Fair; Remote [place]  Insight:Fair  Executive Functions  Concentration:Poor  Attention Span:Poor  Chicken  Psychomotor Activity  Psychomotor Activity:Psychomotor Activity: Decreased  Assets  Assets:Communication Skills; Desire for Improvement; Housing; Resilience; Physical Health; Social Support  Sleep  Sleep:Sleep: Fair Number of Hours of Sleep: 5  Physical Exam: Physical Exam Vitals and nursing note reviewed.  HENT:     Nose: Nose normal.     Mouth/Throat:     Pharynx: Oropharynx is clear.  Cardiovascular:     Rate and Rhythm: Normal rate.     Pulses: Normal pulses.  Pulmonary:     Effort: Pulmonary effort is normal.  Genitourinary:    Comments: Deferred Musculoskeletal:        General: Normal range of motion.  Skin:    General: Skin is warm and dry.  Neurological:     General: No focal deficit present.     Mental  Status: He is oriented to person, place, and time.   Review of Systems  Constitutional:  Negative for chills, diaphoresis and fever.  HENT:  Negative for congestion and sore throat.   Eyes:  Negative for blurred vision.  Respiratory:  Negative for cough, shortness of breath and wheezing.   Cardiovascular:  Negative for chest pain and palpitations.  Gastrointestinal:  Negative for abdominal pain, constipation, diarrhea, heartburn, nausea and vomiting.  Genitourinary:  Negative for dysuria.  Musculoskeletal:  Negative for falls and myalgias.  Neurological:  Negative for dizziness, tingling, tremors, sensory change, speech change, focal weakness, seizures, loss of consciousness, weakness and headaches.  Endo/Heme/Allergies:        Allergies: Augmentin  Blood pressure 134/86, pulse 80, temperature 98.5 F (36.9 C), temperature source Oral, resp. rate 18, height 6' 1.23" (1.86 m), weight 94.1 kg, SpO2 99 %. Body mass index is 27.19 kg/m.  Treatment Plan Summary: Daily contact with patient to assess and evaluate symptoms and progress in treatment and Medication management.  Continue inpatient hospitalization.   Treatment Plan/Recommendations: 1. Admit for crisis management and stabilization, estimated length of stay 3-5 days.   Schizophrenia.   Plan: 2. Medication management to reduce current symptoms to base line and improve the patient's overall level of functioning:  See MAR for plan of care.  -Completed. Zyprexa 2.5 mg once today. -Increased Zyprexa to 10 mg po Q bedtime for Schizophrenia.  -Initiated Fluoxetine 20 mg po daily for depression (start 01-05-21). -Administered Fluoxetine 10 mg po once today for depression.  -Administered Zyprexa zydis 5 mg sublingually once today for psychosis. -Continue Trazodone 50 mg po Q hs prn for insomnia. -Continue the agitation protocol as recommended prn for agitation/psychosis. -Continue all other prn medications as recommended for anxiety,  pain/fever, indigestion, constipation.  Encourage group participation. Discharge disposition plan is ongoing.  Lindell Spar, NP, pmhnp, fnp-bc 01/04/2022, 4:22 PM

## 2022-01-04 NOTE — Group Note (Signed)
Recreation Therapy Group Note   Group Topic:Communication  Group Date: 01/04/2022 Start Time: 0945 End Time: 1025 Facilitators: Victorino Sparrow, LRT,CTRS Location: [address] Dayroom   Goal Area(s) Addresses:  Patient will effectively listen to complete activity.  Patient will identify communication skills used to make activity successful.  Patient will identify how skills used during activity can be used to reach post d/c goals.    Group Description: Geometric Drawings.  Three volunteers from the peer group will be shown an abstract picture with a particular arrangement of geometrical shapes.  Each round, one 'speaker' will describe the pattern, as accurately as possible without revealing the image to the group.  The remaining group members will listen and draw the picture to reflect how it is described to them. Patients with the role of 'listener' cannot ask clarifying questions but, may request that the speaker repeat a direction. Once the drawings are complete, the presenter will show the rest of the group the picture and compare how close each person came to drawing the picture. LRT will facilitate a post-activity discussion regarding effective communication and the importance of planning, listening, and asking for clarification in daily interactions with others.   Affect/Mood: Depressed   Participation Level: None   Participation Quality: None   Behavior: Guarded and Reserved   Speech/Thought Process: None   Insight: None   Judgement: None   Modes of Intervention: Activity   Patient Response to Interventions:  None   Education Outcome:  Acknowledges education and In group clarification offered    Clinical Observations/Individualized Feedback: Pt came in late to group.  Pt did not participate.  Pt sat with a depressed look with no other expression or interaction.    Plan: Continue to engage patient in RT group sessions 2-3x/week.   Victorino Sparrow, LRT,CTRS 01/04/2022  11:43 AM

## 2022-01-04 NOTE — Progress Notes (Signed)
Pt continues to endorse AH and racing thoughts, writer explained Zyprexa increase    01/04/22 2100  Psych Admission Type (Psych Patients Only)  Admission Status Involuntary  Psychosocial Assessment  Patient Complaints Suspiciousness  Eye Contact Avoids;Brief  Facial Expression Anxious;Sullen;Sad;Worried  Affect Anxious;Depressed;Sad;Sullen  Speech Logical/coherent;Soft  Interaction Cautious;Forwards little;Guarded;Minimal  Motor Activity Pacing  Appearance/Hygiene Disheveled  Behavior Characteristics Cooperative  Mood Suspicious;Preoccupied  Thought Radio producer;Concrete thinking  Content Paranoia  Delusions Paranoid  Perception Hallucinations  Hallucination Auditory;Command;Visual  Judgment Poor  Confusion None  Danger to Self  Current suicidal ideation? Denies;Passive  Self-Injurious Behavior No self-injurious ideation or behavior indicators observed or expressed   Danger to Others  Danger to Others None reported or observed

## 2022-01-04 NOTE — Progress Notes (Signed)
Pt was encouraged but didn't attend therapeutic relaxation group. ?

## 2022-01-05 DIAGNOSIS — F200 Paranoid schizophrenia: Secondary | ICD-10-CM | POA: Diagnosis not present

## 2022-01-05 MED ORDER — OLANZAPINE 10 MG PO TABS
10.0000 mg | ORAL_TABLET | Freq: Two times a day (BID) | ORAL | Status: DC
  Administered 2022-01-05 – 2022-01-18 (×26): 10 mg via ORAL
  Filled 2022-01-05 (×28): qty 1

## 2022-01-05 MED ORDER — DOCUSATE SODIUM 100 MG PO CAPS
100.0000 mg | ORAL_CAPSULE | Freq: Two times a day (BID) | ORAL | Status: DC
  Administered 2022-01-06 – 2022-01-17 (×7): 100 mg via ORAL
  Filled 2022-01-05 (×26): qty 1

## 2022-01-05 NOTE — Progress Notes (Signed)
°   01/05/22 0600  °Sleep  °Number of Hours 9.25  ° ° °

## 2022-01-05 NOTE — Progress Notes (Signed)
[facility] MD Progress Note  01/05/2022 5:13 PM [place] Trabert  MRN:  [id number]  Subjective: Miguel Shea reports, "I'm still having anxiety, but it is in my head. My depression is better. I'm still hearing voices in my head telling me to hurt myself. The medicines are helping". Daily notes 01-05-22: Miguel Shea is seen in his room. He is lying down in bed. He presents with a flat/restricted affect. He is verbally responsive, making a minimal eye contact. He reports still having anxiety symptoms, but it is in his head. He continues to complain of AH telling him to hurt himself. Miguel Shea says he is not going to hurt or harm himself. He says he feels safe here. He is instructed & encouraged to come to the staff whenever he feels unsafe or the voices worsen. He was able to contract for safety verbally.  We have adjusted his Zyprexa dose to meet his needs. He rates his depression #6 & anxiety #7 today. He continues to pace up & down the unit hall way from time. There are no behavioral issues displayed or reported by staff. Miguel Shea is encouraged to take his medication as recommended & to be patient to allow the medications to get in his system as he was off his medications for a long time. He currently denies any VH, HI or delusional thinking. He denies any side effects from his medications. Reviewed vital signs (stable). Reviewed current lab results (stable). Reason for admission: 26 year old Caucasian male with hx of mental health issues (Schizophrenia/bipolar disorder). He walked-in to the [facility] yesterday with complaint of command auditory hallucinations telling him to do things & other times, the voices do sound repetitive such as think, think that. He reported that his hallucination is more or less a distorted reality of particles that seem different than what they are.  Principal Problem: Schizophrenia, paranoid ([place])  Diagnosis: Principal Problem:   Schizophrenia, paranoid ([place]) Active Problems:   Schizoaffective  disorder ([place])  Total Time spent with patient:  35 minutes  Past Psychiatric History: Schizophrenia.  Past Medical History:  Past Medical History:  Diagnosis Date   Bipolar 1 disorder ([place])    Wolff-Parkinson-White syndrome    History reviewed. No pertinent surgical history.  Family History: History reviewed. No pertinent family history.  Family Psychiatric  History: See H&P.  Social History:  Social History   Substance and Sexual Activity  Alcohol Use Never     Social History   Substance and Sexual Activity  Drug Use Never    Social History   Socioeconomic History   Marital status: Single    Spouse name: Not on file   Number of children: Not on file   Years of education: Not on file   Highest education level: Not on file  Occupational History   Not on file  Tobacco Use   Smoking status: Never   Smokeless tobacco: Never  Vaping Use   Vaping Use: Some days   Substances: Nicotine, Flavoring  Substance and Sexual Activity   Alcohol use: Never   Drug use: Never   Sexual activity: Not on file  Other Topics Concern   Not on file  Social History Narrative   ** Merged History Encounter **       Social Determinants of Health   Financial Resource Strain: Not on file  Food Insecurity: Not on file  Transportation Needs: Not on file  Physical Activity: Not on file  Stress: Not on file  Social Connections: Not on file  Additional Social History:   Sleep: Good  Appetite:  Good  Current Medications: Current Facility-Administered Medications  Medication Dose Route Frequency Provider Last Rate Last Admin   acetaminophen (TYLENOL) tablet 650 mg  650 mg Oral Q6H PRN Briant Cedar, MD       alum & mag hydroxide-simeth (MAALOX/MYLANTA) 200-200-20 MG/5ML suspension 30 mL  30 mL Oral Q4H PRN Briant Cedar, MD       docusate sodium (COLACE) capsule 100 mg  100 mg Oral BID Massengill, Ovid Curd, MD       FLUoxetine (PROZAC) capsule 20 mg  20 mg Oral  Daily Lindell Spar I, NP   20 mg at 01/05/22 [id number]   hydrOXYzine (ATARAX) tablet 25 mg  25 mg Oral TID PRN Briant Cedar, MD   25 mg at 01/05/22 1148   OLANZapine zydis (ZYPREXA) disintegrating tablet 5 mg  5 mg Oral Q8H PRN Briant Cedar, MD   5 mg at 01/05/22 [id number]   And   LORazepam (ATIVAN) tablet 1 mg  1 mg Oral PRN Briant Cedar, MD       And   ziprasidone (GEODON) injection 20 mg  20 mg Intramuscular PRN Briant Cedar, MD       magnesium hydroxide (MILK OF MAGNESIA) suspension 30 mL  30 mL Oral Daily PRN Briant Cedar, MD       OLANZapine (ZYPREXA) tablet 10 mg  10 mg Oral BID Janine Limbo, MD       traZODone (DESYREL) tablet 50 mg  50 mg Oral QHS PRN Briant Cedar, MD   50 mg at 01/04/22 2100   Lab Results:  Results for orders placed or performed during the hospital encounter of 01/02/22 (from the past 48 hour(s))  CBC     Status: None   Collection Time: 01/04/22  6:36 PM  Result Value Ref Range   WBC 9.0 4.0 - 10.5 K/uL   RBC 5.27 4.22 - 5.81 MIL/uL   Hemoglobin 16.9 13.0 - 17.0 g/dL   HCT 48.3 39.0 - 52.0 %   MCV 91.7 80.0 - 100.0 fL   MCH 32.1 26.0 - 34.0 pg   MCHC 35.0 30.0 - 36.0 g/dL   RDW 12.1 11.5 - 15.5 %   Platelets 309 150 - 400 K/uL   nRBC 0.0 0.0 - 0.2 %    Comment: Performed at [facility], [place] [address], [place],  [address]  Comprehensive metabolic panel     Status: Abnormal   Collection Time: 01/04/22  6:36 PM  Result Value Ref Range   Sodium 141 135 - 145 mmol/L   Potassium 3.5 3.5 - 5.1 mmol/L   Chloride 108 98 - 111 mmol/L   CO2 23 22 - 32 mmol/L   Glucose, Bld 116 (H) 70 - 99 mg/dL    Comment: Glucose reference range applies only to samples taken after fasting for at least 8 hours.   BUN 13 6 - 20 mg/dL   Creatinine, Ser 1.02 0.61 - 1.24 mg/dL   Calcium 9.3 8.9 - 10.3 mg/dL   Total Protein 7.2 6.5 - 8.1 g/dL   Albumin 4.6 3.5 - 5.0 g/dL   AST 21 15 - 41 U/L   ALT 20 0 -  44 U/L   Alkaline Phosphatase 60 38 - 126 U/L   Total Bilirubin 0.9 0.3 - 1.2 mg/dL   GFR, Estimated >60 >60 mL/min    Comment: (NOTE) Calculated using the CKD-EPI Creatinine Equation (  2021)    Anion gap 10 5 - 15    Comment: Performed at [facility], [place] [address], [place], [place] [address]  Hemoglobin A1c     Status: Abnormal   Collection Time: 01/04/22  6:36 PM  Result Value Ref Range   Hgb A1c MFr Bld 4.6 (L) 4.8 - 5.6 %    Comment: (NOTE) Pre diabetes:          5.7%-6.4%  Diabetes:              >6.4%  Glycemic control for   <7.0% adults with diabetes    Mean Plasma Glucose 85.32 mg/dL    Comment: Performed at [facility] [address], [place], [place] [address]  Lipid panel     Status: None   Collection Time: 01/04/22  6:36 PM  Result Value Ref Range   Cholesterol 148 0 - 200 mg/dL   Triglycerides 104 <150 mg/dL   HDL 46 >40 mg/dL   Total CHOL/HDL Ratio 3.2 RATIO   VLDL 21 0 - 40 mg/dL   LDL Cholesterol 81 0 - 99 mg/dL    Comment:        Total Cholesterol/HDL:CHD Risk Coronary Heart Disease Risk Table                     Men   Women  1/2 Average Risk   3.4   3.3  Average Risk       5.0   4.4  2 X Average Risk   9.6   7.1  3 X Average Risk  23.4   11.0        Use the calculated Patient Ratio above and the CHD Risk Table to determine the patient's CHD Risk.        ATP III CLASSIFICATION (LDL):  <100     mg/dL   Optimal  100-129  mg/dL   Near or Above                    Optimal  130-159  mg/dL   Borderline  160-189  mg/dL   High  >190     mg/dL   Very High Performed at [facility] [address] [place], [place] [address]   TSH     Status: None   Collection Time: 01/04/22  6:36 PM  Result Value Ref Range   TSH 1.942 0.350 - 4.500 uIU/mL    Comment: Performed by a 3rd Generation assay with a functional sensitivity of <=0.01 uIU/mL. Performed at [facility], [place] [address],  [place], [place] [address]    Blood Alcohol level:  Lab Results  Component Value Date   [facility] <10 01/02/2022   ETH <10 [id number]   Metabolic Disorder Labs: Lab Results  Component Value Date   HGBA1C 4.6 (L) 01/04/2022   MPG 85.32 01/04/2022   No results found for: PROLACTIN Lab Results  Component Value Date   CHOL 148 01/04/2022   TRIG 104 01/04/2022   HDL 46 01/04/2022   CHOLHDL 3.2 01/04/2022   VLDL 21 01/04/2022   LDLCALC 81 01/04/2022    Physical Findings: AIMS: Facial and Oral Movements Muscles of Facial Expression: None, normal Lips and Perioral Area: None, normal Jaw: None, normal Tongue: None, normal,Extremity Movements Upper (arms, wrists, hands, fingers): None, normal Lower (legs, knees, ankles, toes): None, normal, Trunk Movements Neck, shoulders, hips: None, normal, Overall Severity Severity of abnormal movements (highest  score from questions above): None, normal Incapacitation due to abnormal movements: None, normal Patient's awareness of abnormal movements (rate only patient's report): No Awareness, Dental Status Current problems with teeth and/or dentures?: No Does patient usually wear dentures?: No  CIWA:    COWS:     Musculoskeletal: Strength & Muscle Tone: within normal limits Gait & Station: normal Patient leans: N/A  Psychiatric Specialty Exam:  Presentation  General Appearance: Disheveled  Eye Contact:Poor  Speech:Blocked  Speech Volume:Decreased  Handedness:No data recorded  Mood and Affect  Mood:Dysphoric  Affect:Blunt; Flat  Thought Process  Thought Processes:Coherent  Descriptions of Associations:Intact  Orientation:Full (Time, Place and Person)  Thought Content:Paranoid Ideation  History of Schizophrenia/Schizoaffective disorder:Yes  Duration of Psychotic Symptoms:Greater than six months  Hallucinations: "Yes, AH telling me to hurt myself"  Ideas of Reference:Delusions; Paranoia  Suicidal Thoughts: Yes, passive SI,  able to contract for safety.  Homicidal Thoughts:NA  Sensorium  Memory:Immediate Fair; Recent Fair; Remote [place]  Insight:Fair  Executive Functions  Concentration:Poor  Attention Span:Poor  [place]  Psychomotor Activity  Psychomotor Activity:No data recorded  Assets  Assets:Communication Skills; Desire for Improvement; Housing; Resilience; Physical Health; Social Support  Sleep  Sleep: 9.25  Physical Exam: Physical Exam Vitals and nursing note reviewed.  HENT:     Nose: Nose normal.     Mouth/Throat:     Pharynx: Oropharynx is clear.  Cardiovascular:     Rate and Rhythm: Normal rate.     Pulses: Normal pulses.  Pulmonary:     Effort: Pulmonary effort is normal.  Genitourinary:    Comments: Deferred Musculoskeletal:        General: Normal range of motion.  Skin:    General: Skin is warm and dry.  Neurological:     General: No focal deficit present.     Mental Status: He is oriented to person, place, and time.   Review of Systems  Constitutional:  Negative for chills, diaphoresis and fever.  HENT:  Negative for congestion and sore throat.   Eyes:  Negative for blurred vision.  Respiratory:  Negative for cough, shortness of breath and wheezing.   Cardiovascular:  Negative for chest pain and palpitations.  Gastrointestinal:  Negative for abdominal pain, constipation, diarrhea, heartburn, nausea and vomiting.  Genitourinary:  Negative for dysuria.  Musculoskeletal:  Negative for falls and myalgias.  Neurological:  Negative for dizziness, tingling, tremors, sensory change, speech change, focal weakness, seizures, loss of consciousness, weakness and headaches.  Endo/Heme/Allergies:        Allergies: Augmentin  Blood pressure 134/86, pulse 80, temperature 98.5 F (36.9 C), temperature source Oral, resp. rate 18, height 6' 1.23" (1.86 m), weight 94.1 kg, SpO2 99 %. Body mass index is 27.19  kg/m.  Treatment Plan Summary: Daily contact with patient to assess and evaluate symptoms and progress in treatment and Medication management.  Continue inpatient hospitalization.  Will continue today 01/05/2022 plan as below except where it is noted.   Treatment Plan/Recommendations: 1. Admit for crisis management and stabilization, estimated length of stay 3-5 days.  Diagnoses: Schizophrenia.   Plan: 2. Medication management to reduce current symptoms to base line and improve the patient's overall level of functioning: See [facility] for plan of care.  -Completed. Zyprexa 2.5 mg once today. -Increased Zyprexa 10 mg po bid for Schizophrenia.  -Continue Fluoxetine 20 mg po daily for depression (start 01-05-21). -Continue Trazodone 50 mg po Q hs prn for insomnia. -Continue the agitation protocol  as recommended prn for agitation/psychosis. -Continue all other prn medications as recommended for anxiety, pain/fever, indigestion, constipation. -Initiated Colace 100 mg po bid for constipation.  Encourage group participation. Discharge disposition plan is ongoing.  Lindell Spar, NP, pmhnp, fnp-bc 01/05/2022, 5:13 PM Patient ID: Miguel Shea, male   DOB: [birth year], 26 y.o.   MRN: [id number]

## 2022-01-05 NOTE — Progress Notes (Addendum)
Pt continues to endorse AH, pt stated he was a little anxious also, pt continues to keep to himself much of the evening    01/05/22 2000  Psych Admission Type (Psych Patients Only)  Admission Status Involuntary  Psychosocial Assessment  Patient Complaints Suspiciousness;Worrying  Eye Contact Avoids;Brief  Facial Expression Anxious;Sullen;Sad;Worried  Affect Anxious;Depressed;Sad;Sullen  Speech Logical/coherent;Soft  Interaction Cautious;Forwards little;Guarded;Minimal  Motor Activity Pacing  Appearance/Hygiene Disheveled  Behavior Characteristics Cooperative  Mood Preoccupied  Thought Process  Coherency Blocking;Concrete thinking  Content Paranoia  Delusions Paranoid  Perception Hallucinations  Hallucination Auditory;Command;Visual  Judgment Poor  Confusion None  Danger to Self  Current suicidal ideation? Denies  Self-Injurious Behavior No self-injurious ideation or behavior indicators observed or expressed   Danger to Others  Danger to Others None reported or observed

## 2022-01-05 NOTE — B[facility] Group Notes (Signed)
Pt didn't attend group. 

## 2022-01-05 NOTE — Progress Notes (Signed)
Pt was encouraged but didn't attend orientation/goals group. ?

## 2022-01-05 NOTE — Progress Notes (Signed)
Pt was encouraged but didn't attend therapeutic relaxation group. ?

## 2022-01-05 NOTE — Group Note (Signed)
Type of Therapy and Topic: Group Therapy: Gratitude  Participation: minimal  Description of Group: The purpose behind this group is to get people thinking about things for which  they can be grateful. If continued over time, they might begin to spontaneously look for things and  situations for which to be grateful. Gratitude is related to a wide variety of forms of wellbeing , whereas negative attributions can adversely affect relationships.  Several studies have shown that interventions to  increase gratitude can impact areas such as overall life satisfaction, decreased negative affect, increased happiness, the ability to provide emotional support to others, and decreased worrying.  Summary of Progress: Pt came to group late and left group towards beginning. Pt was able to share that nature makes him happy.   Therapeutic Goals: Patient will learn activities that focus on gratitude in their daily lives. Patient will share gratitude in their daily lives. Patient will learn to develop healthy habits and positive thinking techniques. Patient will receive support and feedback from others   Therapeutic Modalities: Cognitive Behavioral Therapy Solution Focused Therapy Motivational Interviewing

## 2022-01-05 NOTE — Group Note (Signed)
Recreation Therapy Group Note   Group Topic:Team Building  Group Date: 01/05/2022 Start Time: 1008 End Time: 1035 Facilitators: Victorino Sparrow, LRT,CTRS Location: [address] Dayroom   Goal Area(s) Addresses:  Patient will effectively work with peer towards shared goal.  Patient will identify skills used to make activity successful.  Patient will identify how skills used during activity can be used to reach post d/c goals.   Group Description: Straw Bridge. In teams of 3-5, patients were given 15 plastic drinking straws and an equal length of masking tape. Using the materials provided, patients were instructed to build a free standing bridge-like structure to suspend an everyday item (ex: puzzle box) off of the floor or table surface. All materials were required to be used by the team in their design. LRT facilitated post-activity discussion reviewing team process. Patients were encouraged to reflect how the skills used in this activity can be generalized to daily life post discharge.    Affect/Mood: N/A   Participation Level: Did not attend    Clinical Observations/Individualized Feedback:     Plan: Continue to engage patient in RT group sessions 2-3x/week.   Victorino Sparrow, LRT,CTRS 01/05/2022 12:30 PM

## 2022-01-06 DIAGNOSIS — F200 Paranoid schizophrenia: Secondary | ICD-10-CM | POA: Diagnosis not present

## 2022-01-06 MED ORDER — LORAZEPAM 0.5 MG PO TABS
0.5000 mg | ORAL_TABLET | Freq: Three times a day (TID) | ORAL | Status: DC
  Administered 2022-01-06 – 2022-01-10 (×11): 0.5 mg via ORAL
  Filled 2022-01-06 (×12): qty 1

## 2022-01-06 NOTE — B[facility] Group Notes (Signed)
Patient did not attend group. ° ° °Spirituality group facilitated by Chaplain Katy Emslee Lopezmartinez, BCC.  ° °Group Description: Group focused on topic of hope. Patients participated in facilitated discussion around topic, connecting with one another around experiences and definitions for hope. Group members engaged with visual explorer photos, reflecting on what hope looks like for them today. Group engaged in discussion around how their definitions of hope are present today in hospital.  ° °Modalities: Psycho-social ed, Adlerian, Narrative, MI  ° ° °

## 2022-01-06 NOTE — Progress Notes (Signed)
Pt was encouraged but didn't attend orientation/goals group. ?

## 2022-01-06 NOTE — B[facility] Group Notes (Signed)
Goals Group °01/06/22 ° ° ° °Group Focus: affirmation, clarity of thought, and goals/reality orientation °Treatment Modality:  Psychoeducation °Interventions utilized were assignment, group exercise, and support °Purpose: To be able to understand and verbalize the reason for their admission to the hospital. To understand that the medication helps with their chemical imbalance but they also need to work on their choices in life. To be challenged to develop a list of 30 positives about themselves. Also introduce the concept that "feelings" are not reality. ° °Participation Level:  Did not attend ° ° °Miguel Shea A °

## 2022-01-06 NOTE — Progress Notes (Addendum)
[facility] MD Progress Note  01/06/2022 5:40 PM Miguel Shea  MRN:  [id number]  Diagnosis: Principal Problem:   Schizophrenia, paranoid ([place])  Subjective:  Miguel Shea is a 26 year old Caucasian male with a h/o schizophrenia admitted for management of worsening AH and paranoia.  The patient's chart was reviewed and nursing notes were reviewed. Over the past 24 hrs, the patient was given Zyprexa 5 mg PO for agitation yesterday morning. The patient's case was discussed in multidisciplinary team meeting. He did not attend any groups.   On interview this morning, the patient exhibits internal preoccupation with a flat affect.  When asked why he is in the hospital, the patient reports "schizophrenia and hearing voices".  He reports continuing to hear voices and says "it is 1 voice changes to different people".  He is unable to complete moderately challenging arithmetic problem.  He says that while performing this mental task he experienced voices expressing negative sentiments.  Throughout the interview he appears distracted and rubs his eyes frequently.   He denies thought broadcasting but endorses thought insertion.  He feels that "everybody" is controlling his actions.  He endorses passive suicidal ideation and contracts for safety.  He denies homicidal thoughts. He voices no physical complaints and reports stable sleep and appetite.   Principal Problem: Schizophrenia, paranoid (HCC)Principal Problem:   Schizophrenia, paranoid ([place])  Total Time Spent in Direct Patient Care:  I personally spent 30 minutes on the unit in direct patient care. The direct patient care time included face-to-face time with the patient, reviewing the patient's chart, communicating with other professionals, and coordinating care. Greater than 50% of this time was spent in counseling or coordinating care with the patient regarding goals of hospitalization, psycho-education, and discharge planning needs.   Past Psychiatric  History: Schizophrenia.  Past Medical History:  Past Medical History:  Diagnosis Date   Bipolar 1 disorder ([place])    Wolff-Parkinson-White syndrome    History reviewed. No pertinent surgical history.  Family History:see H&P  Family Psychiatric  History: See H&P.  Social History:  Social History   Substance and Sexual Activity  Alcohol Use Never     Social History   Substance and Sexual Activity  Drug Use Never    Social History   Socioeconomic History   Marital status: Single    Spouse name: Not on file   Number of children: Not on file   Years of education: Not on file   Highest education level: Not on file  Occupational History   Not on file  Tobacco Use   Smoking status: Never   Smokeless tobacco: Never  Vaping Use   Vaping Use: Some days   Substances: Nicotine, Flavoring  Substance and Sexual Activity   Alcohol use: Never   Drug use: Never   Sexual activity: Not on file  Other Topics Concern   Not on file  Social History Narrative   ** Merged History Encounter **       Social Determinants of Health   Financial Resource Strain: Not on file  Food Insecurity: Not on file  Transportation Needs: Not on file  Physical Activity: Not on file  Stress: Not on file  Social Connections: Not on file   Additional Social History:   Sleep: Good  Appetite:  Good  Current Medications: Current Facility-Administered Medications  Medication Dose Route Frequency Provider Last Rate Last Admin   acetaminophen (TYLENOL) tablet 650 mg  650 mg Oral Q6H PRN Kai Levins, Redgie Grayer, MD  alum & mag hydroxide-simeth (MAALOX/MYLANTA) 200-200-20 MG/5ML suspension 30 mL  30 mL Oral Q4H PRN Briant Cedar, MD       docusate sodium (COLACE) capsule 100 mg  100 mg Oral BID Massengill, Ovid Curd, MD   100 mg at 01/06/22 0803   FLUoxetine (PROZAC) capsule 20 mg  20 mg Oral Daily Lindell Spar I, NP   20 mg at 01/06/22 0802   hydrOXYzine (ATARAX) tablet 25 mg  25 mg Oral TID  PRN Briant Cedar, MD   25 mg at 01/05/22 2043   LORazepam (ATIVAN) tablet 0.5 mg  0.5 mg Oral TID Corky Sox, MD   0.5 mg at 01/06/22 1703   OLANZapine zydis (ZYPREXA) disintegrating tablet 5 mg  5 mg Oral Q8H PRN Briant Cedar, MD   5 mg at 01/05/22 [id number]   And   LORazepam (ATIVAN) tablet 1 mg  1 mg Oral PRN Briant Cedar, MD       And   ziprasidone (GEODON) injection 20 mg  20 mg Intramuscular PRN Briant Cedar, MD       magnesium hydroxide (MILK OF MAGNESIA) suspension 30 mL  30 mL Oral Daily PRN Briant Cedar, MD       OLANZapine [facility]) tablet 10 mg  10 mg Oral BID Janine Limbo, MD   10 mg at 01/06/22 0802   traZODone (DESYREL) tablet 50 mg  50 mg Oral QHS PRN Briant Cedar, MD   50 mg at 01/05/22 2042   Lab Results:  Results for orders placed or performed during the hospital encounter of 01/02/22 (from the past 48 hour(s))  CBC     Status: None   Collection Time: 01/04/22  6:36 PM  Result Value Ref Range   WBC 9.0 4.0 - 10.5 K/uL   RBC 5.27 4.22 - 5.81 MIL/uL   Hemoglobin 16.9 13.0 - 17.0 g/dL   HCT 48.3 39.0 - 52.0 %   MCV 91.7 80.0 - 100.0 fL   MCH 32.1 26.0 - 34.0 pg   MCHC 35.0 30.0 - 36.0 g/dL   RDW 12.1 11.5 - 15.5 %   Platelets 309 150 - 400 K/uL   nRBC 0.0 0.0 - 0.2 %    Comment: Performed at [facility], [place] [address] [place], [place] [address]  Comprehensive metabolic panel     Status: Abnormal   Collection Time: 01/04/22  6:36 PM  Result Value Ref Range   Sodium 141 135 - 145 mmol/L   Potassium 3.5 3.5 - 5.1 mmol/L   Chloride 108 98 - 111 mmol/L   CO2 23 22 - 32 mmol/L   Glucose, Bld 116 (H) 70 - 99 mg/dL    Comment: Glucose reference range applies only to samples taken after fasting for at least 8 hours.   BUN 13 6 - 20 mg/dL   Creatinine, Ser 1.02 0.61 - 1.24 mg/dL   Calcium 9.3 8.9 - 10.3 mg/dL   Total Protein 7.2 6.5 - 8.1 g/dL   Albumin 4.6 3.5 - 5.0 g/dL   AST 21 15 - 41  U/L   ALT 20 0 - 44 U/L   Alkaline Phosphatase 60 38 - 126 U/L   Total Bilirubin 0.9 0.3 - 1.2 mg/dL   GFR, Estimated >60 >60 mL/min    Comment: (NOTE) Calculated using the CKD-EPI Creatinine Equation (2021)    Anion gap 10 5 - 15    Comment: Performed at [facility], [place]  [address], [place], [place] [address]  Hemoglobin A1c     Status: Abnormal   Collection Time: 01/04/22  6:36 PM  Result Value Ref Range   Hgb A1c MFr Bld 4.6 (L) 4.8 - 5.6 %    Comment: (NOTE) Pre diabetes:          5.7%-6.4%  Diabetes:              >6.4%  Glycemic control for   <7.0% adults with diabetes    Mean Plasma Glucose 85.32 mg/dL    Comment: Performed at [facility] [address] [place], [place] [address]  Lipid panel     Status: None   Collection Time: 01/04/22  6:36 PM  Result Value Ref Range   Cholesterol 148 0 - 200 mg/dL   Triglycerides 104 <150 mg/dL   HDL 46 >40 mg/dL   Total CHOL/HDL Ratio 3.2 RATIO   VLDL 21 0 - 40 mg/dL   LDL Cholesterol 81 0 - 99 mg/dL    Comment:        Total Cholesterol/HDL:CHD Risk Coronary Heart Disease Risk Table                     Men   Women  1/2 Average Risk   3.4   3.3  Average Risk       5.0   4.4  2 X Average Risk   9.6   7.1  3 X Average Risk  23.4   11.0        Use the calculated Patient Ratio above and the CHD Risk Table to determine the patient's CHD Risk.        ATP III CLASSIFICATION (LDL):  <100     mg/dL   Optimal  100-129  mg/dL   Near or Above                    Optimal  130-159  mg/dL   Borderline  160-189  mg/dL   High  >190     mg/dL   Very High Performed at [facility] [address] [place], [place] [address]   TSH     Status: None   Collection Time: 01/04/22  6:36 PM  Result Value Ref Range   TSH 1.942 0.350 - 4.500 uIU/mL    Comment: Performed by a 3rd Generation assay with a functional sensitivity of <=0.01 uIU/mL. Performed at [facility], [place]  [address], [place], [place] [address]    Blood Alcohol level:  Lab Results  Component Value Date   ETH <10 01/02/2022   ETH <10 [id number]   Metabolic Disorder Labs: Lab Results  Component Value Date   HGBA1C 4.6 (L) 01/04/2022   MPG 85.32 01/04/2022   No results found for: PROLACTIN Lab Results  Component Value Date   CHOL 148 01/04/2022   TRIG 104 01/04/2022   HDL 46 01/04/2022   CHOLHDL 3.2 01/04/2022   VLDL 21 01/04/2022   LDLCALC 81 01/04/2022    Physical Findings:  Musculoskeletal: Strength & Muscle Tone: within normal limits Gait & Station: untested in bed Patient leans: N/A  Psychiatric Specialty Exam:  Presentation  General Appearance: Disheveled  Eye Contact:Poor  Speech: hypoverbal with periods of thought blocking   Speech Volume:Decreased  Mood and Affect  Mood:Aloof, dysphoric  Affect:Flat, guarded  Thought Process  Thought Processes: Concrete  Orientation: Oriented to month, year, city  Thought Content: He denies thought broadcasting  but endorses thought insertion and paranoia.  He feels that "everybody" is controlling his actions.  He endorses passive suicidal ideation but contracts for safety.  He denies homicidal thoughts. He appears to be internally preoccupied with periods of thought blocking on exam and admits to AVH.  History of Schizophrenia/Schizoaffective disorder:Yes  Duration of Psychotic Symptoms:Greater than six months  Hallucinations: AH of voices telling him what to do and VH - vague content  Ideas of Reference: none  Suicidal Thoughts: Yes, passive SI, able to contract for safety.  Homicidal Thoughts:Denied  Sensorium  Memory: [place]  Insight:Shallow  Executive Functions  Concentration:Poor  Attention Span:Poor  Recall:Untested  [employer] of Knowledge:Fair  Language:Fair  Psychomotor Activity  Psychomotor Activity: normal  Assets  Assets:Communication Skills; Desire for Improvement; Housing;  Resilience; Physical Health; Social Support  Sleep  7.5 hours   Physical Exam Vitals and nursing note reviewed.  Constitutional:      Appearance: He is not toxic-appearing.  Pulmonary:     Effort: Pulmonary effort is normal.  Neurological:     General: No focal deficit present.     Mental Status: He is alert.   Review of Systems  Respiratory:  Negative for shortness of breath.   Cardiovascular:  Negative for chest pain.  Gastrointestinal:  Negative for constipation, diarrhea, nausea and vomiting.   Blood pressure 140/88, pulse 82, temperature 98.2 F (36.8 C), temperature source Oral, resp. rate 18, height 6' 1.23" (1.86 m), weight 94.1 kg, SpO2 100 %. Body mass index is 27.19 kg/m.  Treatment Plan Summary: Daily contact with patient to assess and evaluate symptoms and progress in treatment and Medication management.  Schizophrenia -Continue Zyprexa 10 mg BID for acute psychosis (started 1/7) -Add Ativan 0.5 mg TID for anxiety to help with anxiety associated with psychotic burden -Continue Fluoxetine 20 mg po daily for depression (start 01-05-21). -Continue Trazodone 50 mg po Q hs prn for insomnia. - Consider addition of a Typical antipsychotic if symptoms do not improve on current regimen  Constipation: -Continue Colace 100 mg po bid for constipation.  Encourage group participation. Discharge disposition plan is ongoing.  Corky Sox, MD PGY-1

## 2022-01-06 NOTE — Progress Notes (Signed)
Patient did not attend wrap up group. 

## 2022-01-06 NOTE — Group Note (Signed)
°  BHH/BMU LCSW Group Therapy Note  Date/Time:  01/06/2022 11:15AM-12:00PM  Type of Therapy and Topic:  Group Therapy:  Feelings About Hospitalization  Participation Level:  Did Not Attend   Description of Group This process group involved patients discussing their feelings related to being hospitalized, as well as the benefits they see to being in the hospital.  These feelings and benefits were itemized.  The group then brainstormed specific ways in which they could seek those same benefits when they discharge and return home.  Therapeutic Goals Patient will identify and describe positive and negative feelings related to hospitalization Patient will verbalize benefits of hospitalization to themselves personally Patients will brainstorm together ways they can obtain similar benefits in the outpatient setting, identify barriers to wellness and possible solutions  Summary of Patient Progress:  The patient was invited to group, did not attend.  Therapeutic Modalities Cognitive Behavioral Therapy Motivational Interviewing    Ambrose Mantle, LCSW 01/06/2022, 1:43 PM
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
## 2022-01-06 NOTE — Progress Notes (Signed)
°   01/06/22 1400  Psych Admission Type (Psych Patients Only)  Admission Status Involuntary  Psychosocial Assessment  Patient Complaints Suspiciousness  Eye Contact Avoids;Brief  Facial Expression Anxious;Sullen;Sad;Worried  Affect Anxious;Depressed;Sad;Sullen  Speech Logical/coherent;Soft  Interaction Cautious;Forwards little;Guarded;Minimal  Motor Activity Pacing  Appearance/Hygiene Disheveled  Behavior Characteristics Cooperative  Mood Suspicious  Thought Process  Coherency Blocking;Concrete thinking  Content Paranoia  Delusions Paranoid  Perception Hallucinations  Hallucination Auditory;Command;Visual  Judgment Poor  Confusion None  Danger to Self  Current suicidal ideation? Denies  Self-Injurious Behavior No self-injurious ideation or behavior indicators observed or expressed   Danger to Others  Danger to Others None reported or observed

## 2022-01-06 NOTE — Progress Notes (Signed)
°   01/06/22 0500  Sleep  Number of Hours 7.5

## 2022-01-07 ENCOUNTER — Other Ambulatory Visit: Payer: Self-pay

## 2022-01-07 DIAGNOSIS — F200 Paranoid schizophrenia: Secondary | ICD-10-CM | POA: Diagnosis not present

## 2022-01-07 MED ORDER — HALOPERIDOL 5 MG PO TABS
5.0000 mg | ORAL_TABLET | Freq: Two times a day (BID) | ORAL | Status: DC
  Administered 2022-01-07 – 2022-01-10 (×6): 5 mg via ORAL
  Filled 2022-01-07 (×9): qty 1

## 2022-01-07 NOTE — BHH Group Notes (Signed)
Adult Psychoeducational Group Not °Date:  01/07/2022 °Time:  0900-1045 °Group Topic/Focus: PROGRESSIVE RELAXATION. Shea group where deep breathing is taught and tensing and relaxation muscle groups is used. Imagery is used as well.  Pts are asked to imagine 3 pillars that hold them up when they are not able to hold themselves up. ° °Participation Level:  did not attend ° °Miguel Shea ° °

## 2022-01-07 NOTE — Progress Notes (Signed)
EKG completed and placed on the front of the chart  

## 2022-01-07 NOTE — Group Note (Signed)
°  BHH/BMU LCSW Group Therapy Note  Date/Time:  01/07/2022 11:15AM-12:00PM  Type of Therapy and Topic:  Group Therapy:  Self-Care after Hospitalization  Participation Level:  Did Not Attend   Description of Group This process group involved patients discussing how they plan to take care of themselves in a better manner when they get home from the hospital.  The group started with patients listing one healthy and one unhealthy way they took care of themselves prior to hospitalization.  A discussion ensued about the differences in healthy and unhealthy coping skills.  Group members shared ideas about making changes when they return home so that they can stay well and in recovery.  The white board was used to list ideas so that patients can continue to see these ideas throughout the day.  Therapeutic Goals Patient will identify and describe one healthy and one unhealthy coping technique used prior to hospitalization Patient will participate in generating ideas about healthy self-care options when they return to the community Patients will be supportive of one another and receive said support from others Patient will identify one healthy self-care activity to add to his/her post-hospitalization life that can help in recovery  Summary of Patient Progress:  The patient was invited to group; did not attend.  Therapeutic Modalities Brief Solution-Focused Therapy Motivational Interviewing Psychoeducation       Veva Holes, Theresia Majors 01/07/2022  2:28 PM

## 2022-01-07 NOTE — Plan of Care (Signed)
  Problem: Education: Goal: Emotional status will improve Outcome: Not Progressing Goal: Mental status will improve Outcome: Not Progressing   Problem: Activity: Goal: Interest or engagement in activities will improve Outcome: Not Progressing   

## 2022-01-07 NOTE — Progress Notes (Signed)
°  On assessment, pt is in bed in his room.  Pt makes very little eye contact, and answers questions quickly.  Pt endorses moderate anxiety and depression.  Pt endorses Passive SI, and verbally contracts for safety.  Pt denies HI.  Pt endorses AVH stating, "they are just saying stuff."  Medication administered.  PRN Hydroxyzine and Trazodone administered per [facility] per pt request.  Pt is safe on unit with Q 15 minute safety checks.    01/06/22 2115  Psych Admission Type (Psych Patients Only)  Admission Status Involuntary  Psychosocial Assessment  Patient Complaints Anxiety;Depression;Suspiciousness  Eye Contact Avoids;Brief  Facial Expression Anxious;Sullen;Sad;Worried  Affect Anxious;Depressed;Sad;Sullen  Speech Logical/coherent;Soft  Interaction Cautious;Forwards little;Guarded;Minimal  Motor Activity Slow  Appearance/Hygiene Disheveled  Behavior Characteristics Cooperative  Mood Depressed;Anxious;Suspicious  Thought Process  Coherency Blocking;Concrete thinking  Content Paranoia  Delusions Paranoid  Perception Hallucinations  Hallucination Auditory;Command;Visual  Judgment Poor  Confusion None  Danger to Self  Current suicidal ideation? Passive  Self-Injurious Behavior No self-injurious ideation or behavior indicators observed or expressed   Agreement Not to Harm Self Yes  Description of Agreement Verbal Contract  Danger to Others  Danger to Others None reported or observed

## 2022-01-07 NOTE — Progress Notes (Signed)
Patient denies SI, HI and AVH this shift. Patient attended groups was compliant with medications.  Patient had no incidents of behavioral dyscontrol this shift.  °Monitor patient for safety, offer medications as prescribed, engage patient 1:1 staff talks.  °Patient able to contract for safety. Continue to monitor as planned.  °

## 2022-01-07 NOTE — Progress Notes (Signed)
Pt was encouraged but didn't attend orientation/goals group. ?
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
## 2022-01-08 ENCOUNTER — Encounter (HOSPITAL_COMMUNITY): Payer: Self-pay

## 2022-01-08 DIAGNOSIS — F319 Bipolar disorder, unspecified: Secondary | ICD-10-CM

## 2022-01-08 NOTE — B[facility] Group Notes (Signed)
Pt didn't attend group. 

## 2022-01-08 NOTE — Progress Notes (Signed)
Adult Psychoeducational Group Note  Date:  01/08/2022 Time:  8:26 PM  Group Topic/Focus:  Wrap-Up Group:   The focus of this group is to help patients review their daily goal of treatment and discuss progress on daily workbooks.  Participation Level:  Did Not Attend  Participation Quality:   Did Not Attend  Affect:  Did Not Attend  Cognitive:  Did Not Attend  Insight: None  Engagement in Group:  Did Not Attend  Modes of Intervention:  Did Not Attend  Additional Comments:  Pt did not attend evening wrap up group tonight.  Felipa Furnace 01/08/2022, 8:26 PM

## 2022-01-08 NOTE — BH IP Treatment Plan (Signed)
Interdisciplinary Treatment and Diagnostic Plan Update  01/08/2022 Time of Session: 11:15am Miguel Shea MRN: [id number]  Principal Diagnosis: Schizophrenia, paranoid (HCC)  Secondary Diagnoses: Principal Problem:   Schizophrenia, paranoid (HCC)   Current Medications:  Current Facility-Administered Medications  Medication Dose Route Frequency Provider Last Rate Last Admin   acetaminophen (TYLENOL) tablet 650 mg  650 mg Oral Q6H PRN Lauro Franklin, MD       alum & mag hydroxide-simeth (MAALOX/MYLANTA) 200-200-20 MG/5ML suspension 30 mL  30 mL Oral Q4H PRN Lauro Franklin, MD       docusate sodium (COLACE) capsule 100 mg  100 mg Oral BID Massengill, Harrold Donath, MD   100 mg at 01/07/22 1652   FLUoxetine (PROZAC) capsule 20 mg  20 mg Oral Daily Armandina Stammer I, NP   20 mg at 01/08/22 0831   haloperidol (HALDOL) tablet 5 mg  5 mg Oral BID Bartholomew Crews E, MD   5 mg at 01/08/22 0831   hydrOXYzine (ATARAX) tablet 25 mg  25 mg Oral TID PRN Lauro Franklin, MD   25 mg at 01/06/22 2118   LORazepam (ATIVAN) tablet 0.5 mg  0.5 mg Oral TID Carlyn Reichert, MD   0.5 mg at 01/08/22 0833   OLANZapine zydis (ZYPREXA) disintegrating tablet 5 mg  5 mg Oral Q8H PRN Lauro Franklin, MD   5 mg at 01/05/22 7253   And   LORazepam (ATIVAN) tablet 1 mg  1 mg Oral PRN Lauro Franklin, MD       And   ziprasidone (GEODON) injection 20 mg  20 mg Intramuscular PRN Lauro Franklin, MD       magnesium hydroxide (MILK OF MAGNESIA) suspension 30 mL  30 mL Oral Daily PRN Lauro Franklin, MD       OLANZapine (ZYPREXA) tablet 10 mg  10 mg Oral BID Massengill, Harrold Donath, MD   10 mg at 01/08/22 0831   traZODone (DESYREL) tablet 50 mg  50 mg Oral QHS PRN Lauro Franklin, MD   50 mg at 01/07/22 2144   PTA Medications: No medications prior to admission.    Patient Stressors: Marital or family conflict   Medication change or noncompliance    Patient Strengths: Motivation for  treatment/growth  Supportive family/friends   Treatment Modalities: Medication Management, Group therapy, Case management,  1 to 1 session with clinician, Psychoeducation, Recreational therapy.   Physician Treatment Plan for Primary Diagnosis: Schizophrenia, paranoid (HCC) Long Term Goal(s): Improvement in symptoms so as ready for discharge   Short Term Goals: Ability to identify and develop effective coping behaviors will improve Ability to maintain clinical measurements within normal limits will improve Compliance with prescribed medications will improve Ability to identify changes in lifestyle to reduce recurrence of condition will improve Ability to verbalize feelings will improve Ability to disclose and discuss suicidal ideas Ability to demonstrate self-control will improve  Medication Management: Evaluate patient's response, side effects, and tolerance of medication regimen.  Therapeutic Interventions: 1 to 1 sessions, Unit Group sessions and Medication administration.  Evaluation of Outcomes: Progressing  Physician Treatment Plan for Secondary Diagnosis: Principal Problem:   Schizophrenia, paranoid (HCC)  Long Term Goal(s): Improvement in symptoms so as ready for discharge   Short Term Goals: Ability to identify and develop effective coping behaviors will improve Ability to maintain clinical measurements within normal limits will improve Compliance with prescribed medications will improve Ability to identify changes in lifestyle to reduce recurrence of condition will improve Ability to verbalize feelings  will improve Ability to disclose and discuss suicidal ideas Ability to demonstrate self-control will improve     Medication Management: Evaluate patient's response, side effects, and tolerance of medication regimen.  Therapeutic Interventions: 1 to 1 sessions, Unit Group sessions and Medication administration.  Evaluation of Outcomes: Progressing   RN Treatment Plan  for Primary Diagnosis: Schizophrenia, paranoid (HCC) Long Term Goal(s): Knowledge of disease and therapeutic regimen to maintain health will improve  Short Term Goals: Ability to remain free from injury will improve, Ability to demonstrate self-control, Ability to participate in decision making will improve, Ability to verbalize feelings will improve, Ability to identify and develop effective coping behaviors will improve, and Compliance with prescribed medications will improve  Medication Management: RN will administer medications as ordered by provider, will assess and evaluate patient's response and provide education to patient for prescribed medication. RN will report any adverse and/or side effects to prescribing provider.  Therapeutic Interventions: 1 on 1 counseling sessions, Psychoeducation, Medication administration, Evaluate responses to treatment, Monitor vital signs and CBGs as ordered, Perform/monitor CIWA, COWS, AIMS and Fall Risk screenings as ordered, Perform wound care treatments as ordered.  Evaluation of Outcomes: Progressing   LCSW Treatment Plan for Primary Diagnosis: Schizophrenia, paranoid (HCC) Long Term Goal(s): Safe transition to appropriate next level of care at discharge, Engage patient in therapeutic group addressing interpersonal concerns.  Short Term Goals: Engage patient in aftercare planning with referrals and resources, Increase social support, Increase ability to appropriately verbalize feelings, Facilitate acceptance of mental health diagnosis and concerns, Identify triggers associated with mental health/substance abuse issues, and Increase skills for wellness and recovery  Therapeutic Interventions: Assess for all discharge needs, 1 to 1 time with Social worker, Explore available resources and support systems, Assess for adequacy in community support network, Educate family and significant other(s) on suicide prevention, Complete Psychosocial Assessment,  Interpersonal group therapy.  Evaluation of Outcomes: Progressing   Progress in Treatment: Attending groups: Yes and no. Participating in groups: No. Taking medication as prescribed: Yes. Toleration medication: Yes. Family/Significant other contact made: Yes, individual(s) contacted:  Mother Patient understands diagnosis: No. Discussing patient identified problems/goals with staff: Yes. Medical problems stabilized or resolved: Yes. Denies suicidal/homicidal ideation: Yes. Issues/concerns per patient self-inventory: No.     New problem(s) identified: No, Describe:  None    New Short Term/Long Term Goal(s): medication stabilization, elimination of SI thoughts, development of comprehensive mental wellness plan.    Patient Goals: "To get on medications"    Discharge Plan or Barriers: Patient is to be referred to ACTT services.   Reason for Continuation of Hospitalization: Anxiety Hallucinations Medication stabilization    Estimated Length of Stay: 3 to 5 days    Scribe for Treatment Team: Otelia Santee, LCSW 01/08/2022 11:20 AM

## 2022-01-08 NOTE — Progress Notes (Signed)
Patient denies SI, HI and AVH this shift. Patient attended groups was compliant with medications.  Patient had no incidents of behavioral dyscontrol this shift.  °Monitor patient for safety, offer medications as prescribed, engage patient 1:1 staff talks.  °Patient able to contract for safety. Continue to monitor as planned.  °

## 2022-01-08 NOTE — Group Note (Signed)
Recreation Therapy Group Note   Group Topic:Healthy Decision Making  Group Date: 01/08/2022 Start Time: [id number] End Time: [id number] Facilitators: Victorino Sparrow, LRT,CTRS Location: [address] Dayroom   Goal Area(s) Addresses:  Patient will effectively work with peer towards shared goal.  Patient will identify factors that guided their decision making.  Patient will pro-socially communicate ideas during group session.   Group Description: Patients were given a scenario that they were going to be stranded on a deserted [place] for several months before being rescued. Writer tasked them with making a list of 15 things they would choose to bring with them for "survival". The list of items was prioritized most important to least. Each patient would come up with their own list, then work together to create a new list of 15 items while in a group of 3-5 peers. LRT discussed each person's list and how it differed from others. The debrief included discussion of priorities, good decisions versus bad decisions, and how it is important to think before acting so we can make the best decision possible. LRT tied the concept of effective communication among group members to patient's support systems outside of the hospital and its benefit post discharge.   Affect/Mood: N/A   Participation Level: Did not attend    Clinical Observations/Individualized Feedback:     Plan: Continue to engage patient in RT group sessions 2-3x/week.   Victorino Sparrow, Glennis Brink  01/08/2022 12:53 PM

## 2022-01-08 NOTE — Group Note (Signed)
LCSW Group Therapy Notes ° °Type of Therapy and Topic: Group Therapy: Healthy Vs. Unhealthy Coping Strategies ° °Date and Time: 01/08/2022 at 1:00pm ° °Participation Level: [facility] PARTICIPATION LEVEL: Did Not Attend ° °Description of Group:  °In this group, patients will be encouraged to explore their healthy and unhealthy coping strategics. Coping strategies are actions that we take to deal with stress, problems, or uncomfortable emotions in our daily lives. Each patient will be challenged to read some scenarios and discuss the unhealthy and healthy coping strategies within those scenarios. Also, each patient will be challenged to describe current healthy and unhealthy strategies that they use in their own lives and discuss the outcomes and barriers to those strategies. This group will be process-oriented, with patients participating in exploration of their own experiences as well as giving and receiving support and challenge from other group members. ° °Therapeutic Goals: °Patient will identify personal healthy and unhealthy coping strategies. °Patient will identify healthy and unhealthy coping strategies, in others, through scenarios.  °Patient will identify expected outcomes of healthy and unhealthy coping strategies. °Patient will identify barriers to using healthy coping strategies.  ° °Summary of Patient Progress: Did not attend ° ° °Therapeutic Modalities: ° °Cognitive Behavioral Therapy °Solution Focused Therapy °Motivational Interviewing ° °Stuti Sandin MSW, LCSW °Clincal Social Worker  °[facility]  °

## 2022-01-08 NOTE — Progress Notes (Signed)
°   01/08/22 2200  Psych Admission Type (Psych Patients Only)  Admission Status Involuntary  Psychosocial Assessment  Patient Complaints Suspiciousness  Eye Contact Avoids  Facial Expression Flat  Affect Flat  Speech Soft  Interaction Cautious  Motor Activity Slow  Appearance/Hygiene Body odor;Disheveled;Poor hygiene  Behavior Characteristics Cooperative  Mood Sad  Thought Process  Coherency Blocking  Content Paranoia  Delusions Paranoid  Perception Hallucinations  Hallucination Auditory;Visual  Judgment Poor  Confusion Mild  Danger to Self  Current suicidal ideation? Denies  Self-Injurious Behavior No self-injurious ideation or behavior indicators observed or expressed   Agreement Not to Harm Self Yes  Danger to Others  Danger to Others None reported or observed

## 2022-01-08 NOTE — Progress Notes (Signed)
D:  Miguel Shea was isolative to his room this evening.  He did not get up for wrap up group or evening snack.  He did not answer questions verbally and barely opened his eyes during staff assessment.  He shook his head when asking about  SI/HI or AVH.  Hygiene was poor and his room smelled of body odor.  He was encouraged to take a shower and change his sheets without success. He finally allowed RN to obtain EKG with much encouragement.  PRN trazodone given for sleep.   A:  1:1 with RN for support and encouragement.  Medications given as ordered.  Q 15 minute checks maintained for safety.  Encouraged participation in group and unit activities.   R:  He is currently resting with his eyes closed and appears to be asleep.  He remains safe on the unit.  We will continue to monitor the progress towards his goals.

## 2022-01-08 NOTE — Progress Notes (Addendum)
[facility] MD Progress Note  01/08/2022 2:55 PM Miguel Shea  MRN:  [id number]  Chief Complaint: psychosis  Reason for admission: Miguel Shea is a 26 year old Caucasian male with a h/o schizophrenia admitted for management of worsening AH and paranoia.The patient is currently on Hospital Day 6.   The patient's chart was reviewed and nursing notes were reviewed. Over the past 24 hrs, there were no documented behavioral issues, no PRN medications given for agitation.The patient's case was discussed in multidisciplinary team meeting.   The following changes were made to the patient's medication regimen yesterday: -Continue Zyprexa 10 mg BID for acute psychosis  -Start Haldol 5mg  bid for residual psychosis (r/b/se/a to med reviewed and patient consents to addition of medication) -Continue Ativan 0.5 mg TID for anxiety to help with anxiety associated with psychotic burden -Continue Fluoxetine 20 mg po daily for depressive sx and anxiety  -Continue Trazodone 50 mg po Q hs prn for insomnia  Information Obtained Today During Patient Interview: On assessment this morning the patient is lethargic and appears internally preoccupied and exhibits thought blocking.  His mood is 'tired' and affect is flat.  He reports hearing voices intermittently, specifically command auditory hallucinations telling him to harm himself.  He endorses that these voices have improved in intensity, volume, and frequency, since admission. Denies CAH to harm others. Reports that anxiety level is still elevated, but less over the last 3 days. He is also asked about a male person who has been living in his home and he reports that she has moved out and says that he is sure of this.  Principal Problem: Schizophrenia, paranoid (HCC)Principal Problem:   Schizophrenia, paranoid ([place])  Total Time Spent in Direct Patient Care:  I personally spent 25 minutes on the unit in direct patient care. The direct patient care time included  face-to-face time with the patient, reviewing the patient's chart, communicating with other professionals, and coordinating care. Greater than 50% of this time was spent in counseling or coordinating care with the patient regarding goals of hospitalization, psycho-education, and discharge planning needs.  Past Psychiatric History: Schizophrenia, history of bipolar disorder, history of GAD, nicotine use disorder   Past Medical History:  Past Medical History:  Diagnosis Date   Bipolar 1 disorder ([place])    Wolff-Parkinson-White syndrome    History reviewed. No pertinent surgical history.  Family History: no pertinent family medical history  Family Psychiatric  History: Drug addiction: Half-sister.  Social History:  Social History   Substance and Sexual Activity  Alcohol Use Never     Social History   Substance and Sexual Activity  Drug Use Never    Social History   Socioeconomic History   Marital status: Single    Spouse name: Not on file   Number of children: Not on file   Years of education: Not on file   Highest education level: Not on file  Occupational History   Not on file  Tobacco Use   Smoking status: Never   Smokeless tobacco: Never  Vaping Use   Vaping Use: Some days   Substances: Nicotine, Flavoring  Substance and Sexual Activity   Alcohol use: Never   Drug use: Never   Sexual activity: Not on file  Other Topics Concern   Not on file  Social History Narrative   ** Merged History Encounter **       Social Determinants of Health   Financial Resource Strain: Not on file  Food Insecurity: Not on file  Transportation Needs: Not on file  Physical Activity: Not on file  Stress: Not on file  Social Connections: Not on file   Additional Social History:   Sleep: Good  Appetite:  Fair  Current Medications: Current Facility-Administered Medications  Medication Dose Route Frequency Provider Last Rate Last Admin   acetaminophen (TYLENOL) tablet 650 mg   650 mg Oral Q6H PRN Briant Cedar, MD       alum & mag hydroxide-simeth (MAALOX/MYLANTA) 200-200-20 MG/5ML suspension 30 mL  30 mL Oral Q4H PRN Briant Cedar, MD       docusate sodium (COLACE) capsule 100 mg  100 mg Oral BID Desirre Eickhoff, Ovid Curd, MD   100 mg at 01/07/22 1652   FLUoxetine (PROZAC) capsule 20 mg  20 mg Oral Daily [name], Herbert Pun I, NP   20 mg at 01/08/22 0831   haloperidol (HALDOL) tablet 5 mg  5 mg Oral BID Viann Fish E, MD   5 mg at 01/08/22 0831   hydrOXYzine (ATARAX) tablet 25 mg  25 mg Oral TID PRN Briant Cedar, MD   25 mg at 01/06/22 2118   LORazepam (ATIVAN) tablet 0.5 mg  0.5 mg Oral TID Corky Sox, MD   0.5 mg at 01/08/22 0833   OLANZapine zydis (ZYPREXA) disintegrating tablet 5 mg  5 mg Oral Q8H PRN Briant Cedar, MD   5 mg at 01/05/22 [id number]   And   LORazepam (ATIVAN) tablet 1 mg  1 mg Oral PRN Briant Cedar, MD       And   ziprasidone (GEODON) injection 20 mg  20 mg Intramuscular PRN Briant Cedar, MD       magnesium hydroxide (MILK OF MAGNESIA) suspension 30 mL  30 mL Oral Daily PRN Briant Cedar, MD       OLANZapine (ZYPREXA) tablet 10 mg  10 mg Oral BID Berline Semrad, Ovid Curd, MD   10 mg at 01/08/22 0831   traZODone (DESYREL) tablet 50 mg  50 mg Oral QHS PRN Briant Cedar, MD   50 mg at 01/07/22 2144   Lab Results:  No results found for this or any previous visit (from the past 88 hour(s)).  Blood Alcohol level:  Lab Results  Component Value Date   ETH <10 01/02/2022   ETH <10 123XX123   Metabolic Disorder Labs: Lab Results  Component Value Date   HGBA1C 4.6 (L) 01/04/2022   MPG 85.32 01/04/2022   No results found for: PROLACTIN Lab Results  Component Value Date   CHOL 148 01/04/2022   TRIG 104 01/04/2022   HDL 46 01/04/2022   CHOLHDL 3.2 01/04/2022   VLDL 21 01/04/2022   LDLCALC 81 01/04/2022    Physical Findings:  Musculoskeletal: Strength & Muscle Tone: within normal  limits Gait & Station: untested, in bed Patient leans: N/A  Psychiatric Specialty Exam:  Presentation  General Appearance: Disheveled, greasy hair  Eye Contact:Minimal  Speech: hypoverbal with periods of thought blocking   Speech Volume:Decreased  Mood and Affect  Mood:Aloof, dysphoric  Affect:Flat, guarded  Thought Process  Thought Processes: [place], answers "yes" to most questions  Orientation: not assessed on this encounter  Thought Content: reports command auditory hallucinations and appears internally preoccupied. He denies homicidal or suicidal thoughts.  History of Schizophrenia/Schizoaffective disorder:Yes  Duration of Psychotic Symptoms:Greater than six months  Hallucinations: as above  Ideas of Reference: receiving messages from unknown sources - unclear content  Suicidal Thoughts: CAH to kill himself  Homicidal Thoughts:Denied  Sensorium  Memory: Limited secondary to psychosis  Judgment: poor  Insight:Poor  Executive Functions  Concentration:Poor  Attention Span:Poor  Recall: [place]  Psychomotor Activity  Psychomotor Activity: Decreased - resting in bed; no tremors or akathisias noted  Assets  Assets:Communication Skills; Desire for Improvement; Housing; Resilience; Physical Health; Social Support  Sleep  fair   Physical Exam Vitals and nursing note reviewed.  Constitutional:      Appearance: He is not toxic-appearing.  Pulmonary:     Effort: Pulmonary effort is normal.  Neurological:     General: No focal deficit present.     Mental Status: He is alert.   Review of Systems  Respiratory:  Negative for shortness of breath.   Cardiovascular:  Negative for chest pain.  Gastrointestinal:  Negative for constipation, diarrhea, nausea and vomiting.   Blood pressure 140/88, pulse 82, temperature 98.2 F (36.8 C), temperature source Oral, resp. rate 18, height 6' 1.23" (1.86 m), weight 94.1  kg, SpO2 100 %. Body mass index is 27.19 kg/m.  Treatment Plan Summary:  Diagnoses / Active Problems: Schizophrenia R/o MDD  PLAN: Safety and Monitoring:  -- Involuntary admission to inpatient psychiatric unit for safety, stabilization and treatment  -- Daily contact with patient to assess and evaluate symptoms and progress in treatment  -- Patient's case to be discussed in multi-disciplinary team meeting  -- Observation Level : q15 minute checks  -- Vital signs:  q12 hours  -- Precautions: suicide, elopement, and assault  2. Psychiatric Diagnoses and Treatment:   Schizophrenia  -Continue Zyprexa 10 mg BID for acute psychosis   -Continue Haldol 5mg  bid for residual psychosis (started 1/8), r/b/se/a to med reviewed and patient consents to addition of medication) -Continue Ativan 0.5 mg TID for anxiety to help with anxiety associated with psychotic burden, will taper as patient improves -Continue Fluoxetine 20 mg po daily for depressive sx and anxiety  -Continue Trazodone 50 mg po Q hs prn for insomnia  - Metabolic profile and EKG monitoring obtained while on an atypical antipsychotic (Lipid Panel: WNL, HbgA1c: 4.6, QTc:409). Repeat EKG obtained due to being on two antipsychotics, with Qtc of 402.  - Encouraged patient to participate in unit milieu and in scheduled group therapies and attend to ADLs  - Would benefit from ACTT referral   3. Medical Issues Being Addressed:   Constipation: -Continue Colace 100 mg po bid for constipation.  4. Discharge Planning:   -- Social work and case management to assist with discharge planning and identification of hospital follow-up needs prior to discharge  -- Discharge Concerns: Need to establish a safety plan; Medication compliance and effectiveness  -- Discharge Goals: Return home with outpatient referrals for mental health follow-up including medication management/psychotherapy  Corky Sox, MD PGY-1    Total Time Spent in Direct  Patient Care:  I personally spent 30 minutes on the unit in direct patient care. The direct patient care time included face-to-face time with the patient, reviewing the patient's chart, communicating with other professionals, and coordinating care. Greater than 50% of this time was spent in counseling or coordinating care with the patient regarding goals of hospitalization, psycho-education, and discharge planning needs.  I have independently evaluated the patient during a face-to-face assessment on 01/08/22. I reviewed the patient's chart, and I participated in key portions of the service. I discussed the case with the [employer], and I agree with the assessment and plan of care as documented in the [employer] note, as  addended by me or notated below:  I directly edited the note, as above. Pt is still psychotic. He is in less anxious distress 2/2 psychosis than when I last evaluated him on Friday. He cannot care for himself inside the hospital and could care for self outside the psychiatric hospital setting.   Janine Limbo, MD Psychiatrist

## 2022-01-08 NOTE — Progress Notes (Signed)
Pt was encouraged but didn't attend orientation/goals group. ?

## 2022-01-09 MED ORDER — FLUOXETINE HCL 20 MG PO CAPS
40.0000 mg | ORAL_CAPSULE | Freq: Every day | ORAL | Status: DC
  Administered 2022-01-10 – 2022-01-18 (×9): 40 mg via ORAL
  Filled 2022-01-09 (×10): qty 2

## 2022-01-09 NOTE — Progress Notes (Addendum)
[facility] MD Progress Note  01/09/2022 4:21 PM Miguel Shea  MRN:  [id number]  Chief Complaint: psychosis  Reason for admission: Miguel Shea is a 26 year old Caucasian male with a h/o schizophrenia admitted for management of worsening AH and paranoia.The patient is currently on Hospital Day 7.   The patient's chart was reviewed and nursing notes were reviewed. Over the past 24 hrs, there were no documented behavioral issues, no PRN medications given for agitation.The patient's case was discussed in multidisciplinary team meeting.   The following changes were made to the patient's medication regimen yesterday: -Continue Zyprexa 10 mg BID for acute psychosis  -Continue Haldol 5mg  bid for residual psychosis -Continue Ativan 0.5 mg TID for anxiety to help with anxiety associated with psychotic burden -Continue Fluoxetine 20 mg po daily for depressive sx and anxiety  -Continue Trazodone 50 mg po Q hs prn for insomnia  Information Obtained Today During Patient Interview: On interview and assessment this morning, the patient exhibits improvement.  On previous encounters, the patient has been lying in bed and appears internally preoccupied, unable to engage meaningfully in interview.  This morning, however, the patient is able to sit on the side of the bed and is alert.  He is able to engage in conversation, stating that he feels much better.  He is able to relate what he ate for breakfast this morning and is able to talk about his life outside the hospital.  His thought process was still somewhat disorganized, and he is unable to say more than a few words at once but is overall significantly improved.  He relates that his anxiety is still significant and is amenable to an increase in his Prozac.  Regarding his auditory hallucinations, he reports that they are not bothering him as much the used to.  He denies any command auditory hallucinations.  He reports passive suicidal thoughts with no plan or  intent.  He denies homicidal thoughts.  Review of systems as below.  Principal Problem: Schizophrenia, paranoid (HCC)Principal Problem:   Schizophrenia, paranoid ([place])  Total Time Spent in Direct Patient Care:  I personally spent 25 minutes on the unit in direct patient care. The direct patient care time included face-to-face time with the patient, reviewing the patient's chart, communicating with other professionals, and coordinating care. Greater than 50% of this time was spent in counseling or coordinating care with the patient regarding goals of hospitalization, psycho-education, and discharge planning needs.  Past Psychiatric History: Schizophrenia, history of bipolar disorder, history of GAD, nicotine use disorder   Past Medical History:  Past Medical History:  Diagnosis Date   Bipolar 1 disorder ([place])    Wolff-Parkinson-White syndrome    History reviewed. No pertinent surgical history.  Family History: no pertinent family medical history  Family Psychiatric  History: Drug addiction: Half-sister.  Social History:  Social History   Substance and Sexual Activity  Alcohol Use Never     Social History   Substance and Sexual Activity  Drug Use Never    Social History   Socioeconomic History   Marital status: Single    Spouse name: Not on file   Number of children: Not on file   Years of education: Not on file   Highest education level: Not on file  Occupational History   Not on file  Tobacco Use   Smoking status: Never   Smokeless tobacco: Never  Vaping Use   Vaping Use: Some days   Substances: Nicotine, Flavoring  Substance and Sexual  Activity   Alcohol use: Never   Drug use: Never   Sexual activity: Not on file  Other Topics Concern   Not on file  Social History Narrative   ** Merged History Encounter **       Social Determinants of Health   Financial Resource Strain: Not on file  Food Insecurity: Not on file  Transportation Needs: Not on file   Physical Activity: Not on file  Stress: Not on file  Social Connections: Not on file   Additional Social History:   Sleep: Good  Appetite:  Fair  Current Medications: Current Facility-Administered Medications  Medication Dose Route Frequency Provider Last Rate Last Admin   acetaminophen (TYLENOL) tablet 650 mg  650 mg Oral Q6H PRN Briant Cedar, MD       alum & mag hydroxide-simeth (MAALOX/MYLANTA) 200-200-20 MG/5ML suspension 30 mL  30 mL Oral Q4H PRN Briant Cedar, MD       docusate sodium (COLACE) capsule 100 mg  100 mg Oral BID Massengill, Ovid Curd, MD   100 mg at 01/07/22 1652   [START ON 01/10/2022] FLUoxetine (PROZAC) capsule 40 mg  40 mg Oral Daily Massengill, Nathan, MD       haloperidol (HALDOL) tablet 5 mg  5 mg Oral BID Nelda Marseille, Amy E, MD   5 mg at 01/09/22 0743   hydrOXYzine (ATARAX) tablet 25 mg  25 mg Oral TID PRN Briant Cedar, MD   25 mg at 01/08/22 2051   LORazepam (ATIVAN) tablet 0.5 mg  0.5 mg Oral TID Corky Sox, MD   0.5 mg at 01/09/22 1302   OLANZapine zydis (ZYPREXA) disintegrating tablet 5 mg  5 mg Oral Q8H PRN Briant Cedar, MD   5 mg at 01/05/22 [id number]   And   LORazepam (ATIVAN) tablet 1 mg  1 mg Oral PRN Briant Cedar, MD       And   ziprasidone (GEODON) injection 20 mg  20 mg Intramuscular PRN Briant Cedar, MD       magnesium hydroxide (MILK OF MAGNESIA) suspension 30 mL  30 mL Oral Daily PRN Briant Cedar, MD       OLANZapine (ZYPREXA) tablet 10 mg  10 mg Oral BID Massengill, Ovid Curd, MD   10 mg at 01/09/22 0743   traZODone (DESYREL) tablet 50 mg  50 mg Oral QHS PRN Briant Cedar, MD   50 mg at 01/08/22 2051   Lab Results:  No results found for this or any previous visit (from the past 59 hour(s)).  Blood Alcohol level:  Lab Results  Component Value Date   ETH <10 01/02/2022   ETH <10 123XX123   Metabolic Disorder Labs: Lab Results  Component Value Date   HGBA1C 4.6 (L)  01/04/2022   MPG 85.32 01/04/2022   No results found for: PROLACTIN Lab Results  Component Value Date   CHOL 148 01/04/2022   TRIG 104 01/04/2022   HDL 46 01/04/2022   CHOLHDL 3.2 01/04/2022   VLDL 21 01/04/2022   LDLCALC 81 01/04/2022    Physical Findings:  Musculoskeletal: Strength & Muscle Tone: within normal limits Gait & Station: untested Patient leans: N/A  Psychiatric Specialty Exam:  Presentation  General Appearance: Disheveled, greasy hair, body odor noted  Eye Contact: fair  Speech: speaks in short phrases and is relatively non-spontaneous  Speech Volume:Decreased  Mood and Affect  Mood: "better"  Affect: constricted, improved  Thought Process  Thought Processes: linear, not-goal directed or abstract  Orientation: not assessed on this encounter  Thought Content: denies command auditory hallucinations for the first time. Reports auditory hallucinations, reduced in intensity. He denies homicidal thoughts. Reports passive suicidal thoughts.   History of Schizophrenia/Schizoaffective disorder:Yes  Duration of Psychotic Symptoms:Greater than six months  Hallucinations: as above  Ideas of Reference: receiving messages from unknown sources - unclear content  Suicidal Thoughts: passive suicidal thoughts with no plan or intent  Homicidal Thoughts:Denied  Sensorium  Memory: fair  Judgment: poor  Insight:Poor  Executive Functions  Concentration:Poor  Attention Span:Poor  Recall: fair  Fund of Knowledge:Fair  Language:Fair  Psychomotor Activity  Psychomotor Activity: Decreased - resting in bed; no tremors or akathisias noted  Assets  Assets:Communication Skills; Desire for Improvement; Housing; Resilience; Physical Health; Social Support  Sleep  fair   Physical Exam Vitals and nursing note reviewed.  Constitutional:      Appearance: He is not toxic-appearing.  Pulmonary:     Effort: Pulmonary effort is normal.  Neurological:      General: No focal deficit present.     Mental Status: He is alert.   Review of Systems  Respiratory:  Negative for shortness of breath.   Cardiovascular:  Negative for chest pain.  Gastrointestinal:  Negative for constipation, diarrhea, nausea and vomiting.   Blood pressure 140/88, pulse 82, temperature 98.2 F (36.8 C), temperature source Oral, resp. rate 18, height 6' 1.23" (1.86 m), weight 94.1 kg, SpO2 100 %. Body mass index is 27.19 kg/m.  Treatment Plan Summary:  Diagnoses / Active Problems: Schizophrenia R/o MDD  PLAN: Safety and Monitoring:  -- Involuntary admission to inpatient psychiatric unit for safety, stabilization and treatment  -- Daily contact with patient to assess and evaluate symptoms and progress in treatment  -- Patient's case to be discussed in multi-disciplinary team meeting  -- Observation Level : q15 minute checks  -- Vital signs:  q12 hours  -- Precautions: suicide, elopement, and assault  2. Psychiatric Diagnoses and Treatment:   Schizophrenia  -Continue Zyprexa 10 mg BID for acute psychosis   -Continue Haldol 5mg  bid for residual psychosis (started 1/8)  -Consider LAI as patient improves -Continue Ativan 0.5 mg TID for anxiety to help with anxiety associated with psychotic burden, will taper as patient improves -Increase Fluoxetine to 40 mg po daily for depressive sx and anxiety  -Continue Trazodone 50 mg po Q hs prn for insomnia  - Metabolic profile and EKG monitoring obtained while on an atypical antipsychotic (Lipid Panel: WNL, HbgA1c: 4.6, QTc:409). Repeat EKG obtained due to being on two antipsychotics, with Qtc of 402.  - Encouraged patient to participate in unit milieu and in scheduled group therapies and attend to ADLs  - Would benefit from ACTT referral   3. Medical Issues Being Addressed:   Constipation: -Continue Colace 100 mg po bid for constipation.  4. Discharge Planning:   -- Social work and case management to assist with  discharge planning and identification of hospital follow-up needs prior to discharge  -- Discharge Concerns: Need to establish a safety plan; Medication compliance and effectiveness  -- Discharge Goals: LCSW working on ACTT referral  Corky Sox, MD PGY-1

## 2022-01-09 NOTE — Progress Notes (Signed)
Pt was encouraged but didn't attend orientation/goals group. ?

## 2022-01-09 NOTE — Progress Notes (Signed)
°   01/09/22 0530  Sleep  Number of Hours 8.25

## 2022-01-09 NOTE — Group Note (Signed)
Recreation Therapy Group Note   Group Topic:Self-Esteem  Group Date: 01/09/2022 Start Time: 1000 End Time: 1055 Facilitators: Caroll Rancher, LRT,CTRS Location: [address] Dayroom   Goal Area(s) Addresses:  Patient will be able to identify benefits of positive self esteem.  Patient will successfully share why positive self esteem is important. Patient will be able to express how positive self esteem will benefit them post d/c.  Group Description:  How I See Me.  Patients and LRT discussed the importance of how they see themselves and their positive qualities. Patients were then given a picture of a blank face, and told to illustrate and describe how they see themselves and their positive traits.  Patients were given markers to complete the assignment. LRT played music for the group as they completed their assignment. Patients shared their completed assignment with each other.   Affect/Mood: N/A   Participation Level: Did not attend    Clinical Observations/Individualized Feedback:     Plan: Continue to engage patient in RT group sessions 2-3x/week.   Caroll Rancher, LRT,CTRS 01/09/2022 12:26 PM

## 2022-01-09 NOTE — Progress Notes (Signed)
Adult Psychoeducational Group Note  Date:  01/09/2022 Time:  8:41 PM  Group Topic/Focus:  Wrap-Up Group:   The focus of this group is to help patients review their daily goal of treatment and discuss progress on daily workbooks.  Participation Level:  Did Not Attend  Participation Quality:   Did Not Attend  Affect:   Did Not Attend  Cognitive:   Did Not Attend  Insight: None  Engagement in Group:   Did Not Attend  Modes of Intervention:  Did Not Attend  Additional Comments:  Pt did not attend evening wrap up group tonight.  Felipa Furnace 01/09/2022, 8:41 PM

## 2022-01-10 DIAGNOSIS — F200 Paranoid schizophrenia: Secondary | ICD-10-CM | POA: Diagnosis not present

## 2022-01-10 MED ORDER — LORAZEPAM 0.5 MG PO TABS
0.5000 mg | ORAL_TABLET | Freq: Three times a day (TID) | ORAL | Status: DC
  Administered 2022-01-10 – 2022-01-15 (×15): 0.5 mg via ORAL
  Filled 2022-01-10 (×15): qty 1

## 2022-01-10 MED ORDER — HALOPERIDOL 5 MG PO TABS: 7.5000 mg | ORAL_TABLET | Freq: Two times a day (BID) | ORAL | Status: DC

## 2022-01-10 MED ORDER — LORAZEPAM 0.5 MG PO TABS: 0.5000 mg | ORAL_TABLET | Freq: Two times a day (BID) | ORAL | Status: DC

## 2022-01-10 MED ORDER — HALOPERIDOL 5 MG PO TABS
7.5000 mg | ORAL_TABLET | Freq: Two times a day (BID) | ORAL | Status: DC
  Administered 2022-01-10 – 2022-01-16 (×13): 7.5 mg via ORAL
  Filled 2022-01-10 (×17): qty 1.5

## 2022-01-10 NOTE — Progress Notes (Signed)
Pt isolated to his room much of the evening, pt appeared to be responding to internal stimuli    01/10/22 2000  Psych Admission Type (Psych Patients Only)  Admission Status Involuntary  Psychosocial Assessment  Patient Complaints Suspiciousness  Eye Contact Avoids  Facial Expression Flat  Affect Flat  Speech Soft  Interaction Cautious  Motor Activity Slow  Appearance/Hygiene Body odor;Disheveled;Poor hygiene  Behavior Characteristics Cooperative  Mood Suspicious;Depressed  Thought Process  Coherency Blocking  Content Paranoia  Delusions Paranoid  Perception Hallucinations  Hallucination Auditory;Visual  Judgment Poor  Confusion None  Danger to Self  Current suicidal ideation? Denies  Self-Injurious Behavior No self-injurious ideation or behavior indicators observed or expressed   Agreement Not to Harm Self Yes  Description of Agreement Verbal Contract  Danger to Others  Danger to Others None reported or observed

## 2022-01-10 NOTE — [facility] Group Notes (Signed)
Adult Psychoeducational Group Note  Date:  01/10/2022 Time:  9:09 AM  Group Topic/Focus:  Goals Group:   The focus of this group is to help patients establish daily goals to achieve during treatment and discuss how the patient can incorporate goal setting into their daily lives to aide in recovery.  Participation Level:  Did Not Attend   Donell Beers 01/10/2022, 9:09 AM

## 2022-01-10 NOTE — Progress Notes (Signed)
°   01/10/22 0530  Sleep  Number of Hours 9.25

## 2022-01-10 NOTE — Group Note (Signed)
Recreation Therapy Group Note   Group Topic:Personal Development  Group Date: 01/10/2022 Start Time: 1000 End Time: 1045 Facilitators: Caroll Rancher, LRT,CTRS Location: [address] Dayroom   Goal Area(s) Addresses:  Patient will successfully identify characteristics that define you. Patient will successfully identify positive actions and behaviors they can use post d/c.   Group Description:  Totika.  LRT introduced the game Totika to patients.  The game is played like [place].  The difference is all the blocks consist of the colors blue, orange, red and green.  Patient will pull a block and place it on top of the stack.  Which ever color the patient picks, LRT will ask them a question that corresponds with the color from deck of card.  Once patient answers the question, the next person will go.   Affect/Mood: N/A   Participation Level: Did not attend    Clinical Observations/Individualized Feedback:      Plan: Continue to engage patient in RT group sessions 2-3x/week.   Caroll Rancher, LRT,CTRS 01/10/2022 1:56 PM

## 2022-01-10 NOTE — Progress Notes (Signed)
[facility] MD Progress Note  01/10/2022 3:13 PM Miguel Shea  MRN:  [id number]  Subjective: Miguel Shea reports, "I'm doing alright. I'm tired, a little bit depressed. I'm just thinking about stuff on my mind. I'm still hearing voices, they are not telling me much. I'm still feeling suicidal". Daily notes 01-10-22: Miguel Shea is seen in his room. He is lying down in bed. He presents with a flat/restricted affect. He is verbally responsive, making a minimal eye contact. He reports still having symptoms of depression & anxiety. Rates depression # 8 & anxiety #8-9. He says he feels that way because of the stuff in his mind. He continues to complain of AH but says they are not telling him much. Miguel Shea denies any plans or intent to hurt himself. But, when given a one to one time to speak to one of our male NP student, Miguel Shea reports that the reason for her depression is because of the break-up of his relationship with his girlfriend of one year, whom she did not know that throughout their time together, she was with him just for his money. And when his money ran out, she left him. He was give empathetic support. He is instructed & encouraged to come to the staff whenever he feels unsafe or the voices worsen. He was able to contract for safety verbally.  We have adjusted his Haldol & Ativan doses to meet his needs. He rates his depression #8 & anxiety #8-9 today. He continues to pace up & down the unit hall way from time to time. There are no behavioral issues displayed or reported by staff. Miguel Shea is encouraged to take his medication as recommended. He currently denies any VH, HI or delusional thinking. He denies any side effects from his medications. Reviewed vital signs (stable). Reviewed current lab results (stable).  Reason for admission: 26 year old Caucasian male with hx of mental health issues (Schizophrenia/bipolar disorder). He walked-in to the [facility] yesterday with complaint of command auditory hallucinations  telling him to do things & other times, the voices do sound repetitive such as think, think that. He reported that his hallucination is more or less a distorted reality of particles that seem different than what they are.  Principal Problem: Schizophrenia, paranoid (HCC)  Diagnosis: Principal Problem:   Schizophrenia, paranoid (HCC)  Total Time spent with patient:  35 minutes  Past Psychiatric History: Schizophrenia.  Past Medical History:  Past Medical History:  Diagnosis Date   Bipolar 1 disorder (HCC)    Wolff-Parkinson-White syndrome    History reviewed. No pertinent surgical history.  Family History: History reviewed. No pertinent family history.  Family Psychiatric  History: See H&P.  Social History:  Social History   Substance and Sexual Activity  Alcohol Use Never     Social History   Substance and Sexual Activity  Drug Use Never    Social History   Socioeconomic History   Marital status: Single    Spouse name: Not on file   Number of children: Not on file   Years of education: Not on file   Highest education level: Not on file  Occupational History   Not on file  Tobacco Use   Smoking status: Never   Smokeless tobacco: Never  Vaping Use   Vaping Use: Some days   Substances: Nicotine, Flavoring  Substance and Sexual Activity   Alcohol use: Never   Drug use: Never   Sexual activity: Not on file  Other Topics Concern   Not on file  Social History Narrative   ** Merged History Encounter **       Social Determinants of Health   Financial Resource Strain: Not on file  Food Insecurity: Not on file  Transportation Needs: Not on file  Physical Activity: Not on file  Stress: Not on file  Social Connections: Not on file   Additional Social History:   Sleep: Good  Appetite:  Good  Current Medications: Current Facility-Administered Medications  Medication Dose Route Frequency Provider Last Rate Last Admin   acetaminophen (TYLENOL) tablet 650 mg   650 mg Oral Q6H PRN Briant Cedar, MD       alum & mag hydroxide-simeth (MAALOX/MYLANTA) 200-200-20 MG/5ML suspension 30 mL  30 mL Oral Q4H PRN Briant Cedar, MD       docusate sodium (COLACE) capsule 100 mg  100 mg Oral BID Massengill, Ovid Curd, MD   100 mg at 01/07/22 1652   FLUoxetine (PROZAC) capsule 40 mg  40 mg Oral Daily Massengill, Ovid Curd, MD   40 mg at 01/10/22 0836   haloperidol (HALDOL) tablet 7.5 mg  7.5 mg Oral BID Lindell Spar I, NP       hydrOXYzine (ATARAX) tablet 25 mg  25 mg Oral TID PRN Briant Cedar, MD   25 mg at 01/09/22 2045   LORazepam (ATIVAN) tablet 0.5 mg  0.5 mg Oral TID Lindell Spar I, NP       OLANZapine zydis (ZYPREXA) disintegrating tablet 5 mg  5 mg Oral Q8H PRN Briant Cedar, MD   5 mg at 01/05/22 [id number]   And   LORazepam (ATIVAN) tablet 1 mg  1 mg Oral PRN Briant Cedar, MD       And   ziprasidone (GEODON) injection 20 mg  20 mg Intramuscular PRN Briant Cedar, MD       magnesium hydroxide (MILK OF MAGNESIA) suspension 30 mL  30 mL Oral Daily PRN Briant Cedar, MD       OLANZapine (ZYPREXA) tablet 10 mg  10 mg Oral BID Janine Limbo, MD   10 mg at 01/10/22 0836   traZODone (DESYREL) tablet 50 mg  50 mg Oral QHS PRN Briant Cedar, MD   50 mg at 01/09/22 2045   Lab Results:  No results found for this or any previous visit (from the past 31 hour(s)).  Blood Alcohol level:  Lab Results  Component Value Date   ETH <10 01/02/2022   ETH <10 [id number]   Metabolic Disorder Labs: Lab Results  Component Value Date   HGBA1C 4.6 (L) 01/04/2022   MPG 85.32 01/04/2022   No results found for: PROLACTIN Lab Results  Component Value Date   CHOL 148 01/04/2022   TRIG 104 01/04/2022   HDL 46 01/04/2022   CHOLHDL 3.2 01/04/2022   VLDL 21 01/04/2022   LDLCALC 81 01/04/2022   Physical Findings: AIMS: Facial and Oral Movements Muscles of Facial Expression: None, normal Lips and Perioral Area:  None, normal Jaw: None, normal Tongue: None, normal,Extremity Movements Upper (arms, wrists, hands, fingers): None, normal Lower (legs, knees, ankles, toes): None, normal, Trunk Movements Neck, shoulders, hips: None, normal, Overall Severity Severity of abnormal movements (highest score from questions above): None, normal Incapacitation due to abnormal movements: None, normal Patient's awareness of abnormal movements (rate only patient's report): No Awareness, Dental Status Current problems with teeth and/or dentures?: No Does patient usually wear dentures?: No  CIWA:    COWS:     Musculoskeletal: Strength & Muscle  Tone: within normal limits Gait & Station: normal Patient leans: N/A  Psychiatric Specialty Exam:  Presentation  General Appearance: Disheveled  Eye Contact:Poor  Speech:Blocked  Speech Volume:Decreased  Handedness:No data recorded  Mood and Affect  Mood:Dysphoric  Affect:Blunt; Flat  Thought Process  Thought Processes:Coherent  Descriptions of Associations:Intact  Orientation:Full (Time, Place and Person)  Thought Content:Paranoid Ideation  History of Schizophrenia/Schizoaffective disorder:Yes  Duration of Psychotic Symptoms:Greater than six months  Hallucinations: "Yes, AH telling me to hurt myself"  Ideas of Reference:Delusions; Paranoia  Suicidal Thoughts: Yes, passive SI, able to contract for safety.  Homicidal Thoughts:NA  Sensorium  Memory:Immediate Fair; Recent Fair; Remote [place]  Insight:Fair  Executive Functions  Concentration:Poor  Attention Span:Poor  [place]  Psychomotor Activity  Psychomotor Activity:No data recorded  Assets  Assets:Communication Skills; Desire for Improvement; Housing; Resilience; Physical Health; Social Support  Sleep  Sleep: 9.25  Physical Exam: Physical Exam Vitals and nursing note reviewed.  HENT:     Nose: Nose normal.      Mouth/Throat:     Pharynx: Oropharynx is clear.  Cardiovascular:     Rate and Rhythm: Normal rate.     Pulses: Normal pulses.  Pulmonary:     Effort: Pulmonary effort is normal.  Genitourinary:    Comments: Deferred Musculoskeletal:        General: Normal range of motion.  Skin:    General: Skin is warm and dry.  Neurological:     General: No focal deficit present.     Mental Status: He is oriented to person, place, and time.   Review of Systems  Constitutional:  Negative for chills, diaphoresis and fever.  HENT:  Negative for congestion and sore throat.   Eyes:  Negative for blurred vision.  Respiratory:  Negative for cough, shortness of breath and wheezing.   Cardiovascular:  Negative for chest pain and palpitations.  Gastrointestinal:  Negative for abdominal pain, constipation, diarrhea, heartburn, nausea and vomiting.  Genitourinary:  Negative for dysuria.  Musculoskeletal:  Negative for falls and myalgias.  Neurological:  Negative for dizziness, tingling, tremors, sensory change, speech change, focal weakness, seizures, loss of consciousness, weakness and headaches.  Endo/Heme/Allergies:        Allergies: Augmentin  Blood pressure 140/88, pulse 82, temperature 98.2 F (36.8 C), temperature source Oral, resp. rate 18, height 6' 1.23" (1.86 m), weight 94.1 kg, SpO2 100 %. Body mass index is 27.19 kg/m.  Treatment Plan Summary: Daily contact with patient to assess and evaluate symptoms and progress in treatment and Medication management.  Continue inpatient hospitalization.  Will continue today 01/10/2022 plan as below except where it is noted.   Treatment Plan/Recommendations: 1. Admit for crisis management and stabilization, estimated length of stay 3-5 days.  Diagnoses: Schizophrenia.   Plan: 2. Medication management to reduce current symptoms to base line and improve the patient's overall level of functioning: See [facility] for plan of care.  -Continue Zyprexa 10 mg po  bid for Schizophrenia.  -Increased Haldol to 7.5 mg po bid. -Increased lorazepam 0.5 mg to tid for anxiety/catatonia. -Continue Fluoxetine 40 mg po daily for depression (start 01-05-21). -Continue Trazodone 50 mg po Q hs prn for insomnia. -Continue the agitation protocol as recommended prn for agitation/psychosis. -Continue all other prn medications as recommended for anxiety, pain/fever, indigestion. -Continue  Colace 100 mg po bid for constipation.  Encourage group participation. Discharge disposition plan is ongoing.  Lindell Spar, NP, pmhnp, fnp-bc 01/10/2022, 3:13 PM Patient  ID: Wenda Low, male   DOB: June 19, 1996, 26 y.o.   MRN: [id number] Patient ID: Kyus Zerkel, male   DOB: September 25, 1996, 26 y.o.   MRN: [id number]

## 2022-01-10 NOTE — Group Note (Signed)
Type of Therapy/Topic: Identifying Irrational Beliefs/Thoughts ° °Participation Level: Did Not Attend ° °Description of Group: °The purpose of this group is to assist patients in learning to identify irrational beliefs and thoughts that contribute to their negative emotions and experience positive emotions. Patients will be guided to discuss ways in which they have been effected by irrational thoughts and beliefs and how to transform those irrational beliefs into rational ones. Newly identified rational beliefs will be juxtaposed with experiences of positive emotions or situations, and patients will be challenged to use rational beliefs or thoughts to combat negative ones. Special emphasis will be placed on coping with irrational beliefs in conflict situations, and patients will process healthy conflict resolution skills. ° °Therapeutic Goals: °1. Patient will identify two irrational thoughts or beliefs  to reflect on in order to balance out those thoughts °2. Patient will label two or more irrational thoughts/beliefs that they find the most difficult to cope with °3. Patient will demonstrate positive conflict resolution skills through discussion and/or role plays that will assist in transforming irrational thoughts or beliefs into positive ones. ° °Summary of Patient Progress: Did not attend ° ° °Therapeutic Modalities: °Cognitive Behavioral Therapy °Feelings Identification °Dialectical Behavioral Therapy °

## 2022-01-10 NOTE — Progress Notes (Signed)
°   01/10/22 1500  Psych Admission Type (Psych Patients Only)  Admission Status Involuntary  Psychosocial Assessment  Patient Complaints Suspiciousness  Eye Contact Avoids  Facial Expression Flat  Affect Flat  Speech Soft  Interaction Cautious  Motor Activity Slow  Appearance/Hygiene Body odor;Disheveled;Poor hygiene  Behavior Characteristics Cooperative  Mood Depressed  Thought Process  Coherency Blocking  Content Paranoia  Delusions Paranoid  Perception Hallucinations  Hallucination Auditory;Visual  Judgment Poor  Confusion None  Danger to Self  Current suicidal ideation? Denies  Self-Injurious Behavior No self-injurious ideation or behavior indicators observed or expressed   Agreement Not to Harm Self Yes  Description of Agreement Verbal Contract  Danger to Others  Danger to Others None reported or observed

## 2022-01-11 DIAGNOSIS — F200 Paranoid schizophrenia: Secondary | ICD-10-CM | POA: Diagnosis not present

## 2022-01-11 NOTE — Progress Notes (Signed)
[facility] MD Progress Note  01/11/2022 10:28 AM Miguel Shea  MRN:  [id number]  Subjective: Miguel Shea reports, "It's going well today. I have been up for breakfast & it was good. My mood is alright. I'm doing well on my medicines. The medicines seem to be helping. The voices are all there in my head all the time, but not saying much today. I'm just feeling too tired to attend group sessions but I do come out of my room for medicines also". Daily notes 01-11-22: Miguel Shea is seen in his room. He is lying down in bed. He presents with a flat/restricted affect. He is verbally responsive, making a minimal eye contact. He seem to be doing better today than how he appeared/presented yesterday. He is groomed, has changed his clothes. He is wearing a clean pair of jeans & a t-shirt. He says it is going well today & reports mood as alright. He continues to endorse AH, saying the voices are still all in my head but not saying much. Miguel Shea things he is benefiting from his treatment regimen. Denies any side effects. He maintained that he does not want to go to the group sessions because of tiredness. Says he does come out of his room for meals & during medication administration. He currently denies any SIHI, VH, delusional thoughts or paranoia. He does not appear to be responding to any internal stimuli. He rated his depression & anxiety today bot #6.  Miguel Shea denies any plans or intent to hurt himself. He did speak with one of our male NP student yesterday that the reason for his depression is because of the break-up of his relationship with his girlfriend of one year, whom she did not know that throughout their time together, she was with him just for his money. And when his money ran out, she left him. He was give empathetic support. He is instructed & encouraged to come to the staff whenever he feels unsafe or the voices worsen. He was able to contract for safety verbally.  We have adjusted his Haldol & Ativan doses to meet  his needs. Staff reports that he continues to pace up & down the unit hall way from time to time. There are no behavioral issues displayed or reported by staff. Miguel Shea is encouraged to take his medication as recommended. He denies any side effects from his medications. Reviewed vital signs (stable). Reviewed current lab results (stable).  Reason for admission: 26 year old Caucasian male with hx of mental health issues (Schizophrenia/bipolar disorder). He walked-in to the [facility] yesterday with complaint of command auditory hallucinations telling him to do things & other times, the voices do sound repetitive such as think, think that. He reported that his hallucination is more or less a distorted reality of particles that seem different than what they are.  Principal Problem: Schizophrenia, paranoid ([place])  Diagnosis: Principal Problem:   Schizophrenia, paranoid ([place])  Total Time spent with patient:  35 minutes  Past Psychiatric History: Schizophrenia.  Past Medical History:  Past Medical History:  Diagnosis Date   Bipolar 1 disorder ([place])    Wolff-Parkinson-White syndrome    History reviewed. No pertinent surgical history.  Family History: History reviewed. No pertinent family history.  Family Psychiatric  History: See H&P.  Social History:  Social History   Substance and Sexual Activity  Alcohol Use Never     Social History   Substance and Sexual Activity  Drug Use Never    Social History   Socioeconomic History  Marital status: Single    Spouse name: Not on file   Number of children: Not on file   Years of education: Not on file   Highest education level: Not on file  Occupational History   Not on file  Tobacco Use   Smoking status: Never   Smokeless tobacco: Never  Vaping Use   Vaping Use: Some days   Substances: Nicotine, Flavoring  Substance and Sexual Activity   Alcohol use: Never   Drug use: Never   Sexual activity: Not on file  Other Topics Concern   Not  on file  Social History Narrative   ** Merged History Encounter **       Social Determinants of Health   Financial Resource Strain: Not on file  Food Insecurity: Not on file  Transportation Needs: Not on file  Physical Activity: Not on file  Stress: Not on file  Social Connections: Not on file   Additional Social History:   Sleep: Good  Appetite:  Good  Current Medications: Current Facility-Administered Medications  Medication Dose Route Frequency Provider Last Rate Last Admin   acetaminophen (TYLENOL) tablet 650 mg  650 mg Oral Q6H PRN Briant Cedar, MD       alum & mag hydroxide-simeth (MAALOX/MYLANTA) 200-200-20 MG/5ML suspension 30 mL  30 mL Oral Q4H PRN Briant Cedar, MD       docusate sodium (COLACE) capsule 100 mg  100 mg Oral BID Massengill, Ovid Curd, MD   100 mg at 01/07/22 1652   FLUoxetine (PROZAC) capsule 40 mg  40 mg Oral Daily Massengill, Ovid Curd, MD   40 mg at 01/11/22 0817   haloperidol (HALDOL) tablet 7.5 mg  7.5 mg Oral BID Lindell Spar I, NP   7.5 mg at 01/11/22 0817   hydrOXYzine (ATARAX) tablet 25 mg  25 mg Oral TID PRN Briant Cedar, MD   25 mg at 01/09/22 2045   LORazepam (ATIVAN) tablet 0.5 mg  0.5 mg Oral TID Lindell Spar I, NP   0.5 mg at 01/11/22 0817   OLANZapine zydis (ZYPREXA) disintegrating tablet 5 mg  5 mg Oral Q8H PRN Briant Cedar, MD   5 mg at 01/05/22 [id number]   And   LORazepam (ATIVAN) tablet 1 mg  1 mg Oral PRN Briant Cedar, MD       And   ziprasidone (GEODON) injection 20 mg  20 mg Intramuscular PRN Briant Cedar, MD       magnesium hydroxide (MILK OF MAGNESIA) suspension 30 mL  30 mL Oral Daily PRN Briant Cedar, MD       OLANZapine (ZYPREXA) tablet 10 mg  10 mg Oral BID Janine Limbo, MD   10 mg at 01/11/22 0817   traZODone (DESYREL) tablet 50 mg  50 mg Oral QHS PRN Briant Cedar, MD   50 mg at 01/10/22 2048   Lab Results:  No results found for this or any previous visit  (from the past 48 hour(s)).  Blood Alcohol level:  Lab Results  Component Value Date   ETH <10 01/02/2022   ETH <10 123XX123   Metabolic Disorder Labs: Lab Results  Component Value Date   HGBA1C 4.6 (L) 01/04/2022   MPG 85.32 01/04/2022   No results found for: PROLACTIN Lab Results  Component Value Date   CHOL 148 01/04/2022   TRIG 104 01/04/2022   HDL 46 01/04/2022   CHOLHDL 3.2 01/04/2022   VLDL 21 01/04/2022   LDLCALC 81 01/04/2022  Physical Findings: AIMS: Facial and Oral Movements Muscles of Facial Expression: None, normal Lips and Perioral Area: None, normal Jaw: None, normal Tongue: None, normal,Extremity Movements Upper (arms, wrists, hands, fingers): None, normal Lower (legs, knees, ankles, toes): None, normal, Trunk Movements Neck, shoulders, hips: None, normal, Overall Severity Severity of abnormal movements (highest score from questions above): None, normal Incapacitation due to abnormal movements: None, normal Patient's awareness of abnormal movements (rate only patient's report): No Awareness, Dental Status Current problems with teeth and/or dentures?: No Does patient usually wear dentures?: No  CIWA:    COWS:     Musculoskeletal: Strength & Muscle Tone: within normal limits Gait & Station: normal Patient leans: N/A  Psychiatric Specialty Exam:  Presentation  General Appearance: Casual (Improved grooming).)  Eye Contact:Fair; Other (comment) (Patient makes a fair eye contact when prompted.)  Speech:Clear and Coherent; Slow  Speech Volume:Decreased  Handedness:Right   Mood and Affect  Mood:Anxious; Depressed (With some improvememnt noted.)  Affect:Blunt  Thought Process  Thought Processes:Coherent; Goal Directed  Descriptions of Associations:Intact  Orientation:Full (Time, Place and Person)  Thought Content:Paranoid Ideation  History of Schizophrenia/Schizoaffective disorder:Yes  Duration of Psychotic Symptoms:Greater than  six months  Hallucinations: "Yes, the voices are there, but not saying much".  Ideas of Reference:Paranoia  Suicidal Thoughts: Denies.  Homicidal Thoughts: Denies.  Sensorium  Memory:Immediate Fair; Recent Fair; Remote Fair  Judgment:Fair  Insight:Fair  Executive Functions  Concentration:Fair  Attention Span:Fair  [place]  Psychomotor Activity  Psychomotor Activity:Psychomotor Activity: Decreased   Assets  Assets:Communication Skills; Desire for Improvement; Housing; Physical Health; Resilience; Social Support  Sleep  Sleep: 7.5 hours (Per documentation).  Physical Exam: Physical Exam Vitals and nursing note reviewed.  HENT:     Nose: Nose normal.     Mouth/Throat:     Pharynx: Oropharynx is clear.  Cardiovascular:     Rate and Rhythm: Normal rate.     Pulses: Normal pulses.  Pulmonary:     Effort: Pulmonary effort is normal.  Genitourinary:    Comments: Deferred Musculoskeletal:        General: Normal range of motion.  Skin:    General: Skin is warm and dry.  Neurological:     General: No focal deficit present.     Mental Status: He is oriented to person, place, and time.   Review of Systems  Constitutional:  Negative for chills, diaphoresis and fever.  HENT:  Negative for congestion and sore throat.   Eyes:  Negative for blurred vision.  Respiratory:  Negative for cough, shortness of breath and wheezing.   Cardiovascular:  Negative for chest pain and palpitations.  Gastrointestinal:  Negative for abdominal pain, constipation, diarrhea, heartburn, nausea and vomiting.  Genitourinary:  Negative for dysuria.  Musculoskeletal:  Negative for falls and myalgias.  Neurological:  Negative for dizziness, tingling, tremors, sensory change, speech change, focal weakness, seizures, loss of consciousness, weakness and headaches.  Endo/Heme/Allergies:        Allergies: Augmentin  Blood pressure 140/88, pulse 82,  temperature 98.2 F (36.8 C), temperature source Oral, resp. rate 18, height 6' 1.23" (1.86 m), weight 94.1 kg, SpO2 100 %. Body mass index is 27.19 kg/m.  Treatment Plan Summary: Daily contact with patient to assess and evaluate symptoms and progress in treatment and Medication management.  Continue inpatient hospitalization.  Will continue today 01/11/2022 plan as below except where it is noted.   Treatment Plan/Recommendations: 1. Admit for crisis management and stabilization, estimated length of  stay 3-5 days.  Diagnoses: Schizophrenia.   Plan: 2. Medication management to reduce current symptoms to base line and improve the patient's overall level of functioning: See [facility] for plan of care.  -Continue Zyprexa 10 mg po bid for Schizophrenia.  -Continue Haldol 7.5 mg po bid. -Continue lorazepam 0.5 mg to tid for anxiety/catatonia. -Continue Fluoxetine 40 mg po daily for depression. -Continue Trazodone 50 mg po Q hs prn for insomnia. -Continue the agitation protocol as recommended prn for agitation/psychosis. -Continue all other prn medications as recommended for anxiety, pain/fever, indigestion. -Continue  Colace 100 mg po bid for constipation.  Encourage group participation. Discharge disposition plan is ongoing.  Lindell Spar, NP, pmhnp, fnp-bc 01/11/2022, 10:28 AM Patient ID: Miguel Shea, male   DOB: [birth year], 26 y.o.   MRN: [id number] Patient ID: Miguel Shea, male   DOB: Dec 21, 1996, 26 y.o.   MRN: [id number] Patient ID: Miguel Shea, male   DOB: [birth year], 26 y.o.   MRN: [id number]

## 2022-01-11 NOTE — Progress Notes (Signed)
°   01/11/22 0545  °Sleep  °Number of Hours 7.5  ° ° °

## 2022-01-11 NOTE — Progress Notes (Signed)
°   01/10/22 2000  Psych Admission Type (Psych Patients Only)  Admission Status Involuntary  Psychosocial Assessment  Patient Complaints Suspiciousness  Eye Contact Avoids  Facial Expression Flat  Affect Flat  Speech Soft  Interaction Cautious  Motor Activity Slow  Appearance/Hygiene Body odor;Disheveled;Poor hygiene  Behavior Characteristics Cooperative  Mood Suspicious;Depressed  Thought Process  Coherency Blocking  Content Paranoia  Delusions Paranoid  Perception Hallucinations  Hallucination Auditory;Visual  Judgment Poor  Confusion None  Danger to Self  Current suicidal ideation? Denies  Self-Injurious Behavior No self-injurious ideation or behavior indicators observed or expressed   Agreement Not to Harm Self Yes  Description of Agreement Verbal Contract  Danger to Others  Danger to Others None reported or observed

## 2022-01-11 NOTE — [facility] Group Notes (Signed)
Adult Psychoeducational Group Note  Date:  01/11/2022 Time:  9:27 AM  Group Topic/Focus:  Goals Group:   The focus of this group is to help patients establish daily goals to achieve during treatment and discuss how the patient can incorporate goal setting into their daily lives to aide in recovery.  Participation Level:  Did Not Attend    Donell Beers 01/11/2022, 9:27 AM

## 2022-01-11 NOTE — Group Note (Signed)
Recreation Therapy Group Note   Group Topic:Coping Skills  Group Date: 01/11/2022 Start Time: 0950 End Time: 1030 Facilitators: Caroll Rancher, LRT,CTRS Location: [address] Dayroom   Goal Area(s) Addresses: Patient will define what a coping skill is. Patient will successfully identify positive coping skills they can use post d/c.  Patient will acknowledge benefit(s) of using learned coping skills post d/c.   Group Description: Coping A to Z. Patient asked to identify what a coping skill is and when they use them. Patients with Clinical research associate discussed healthy versus unhealthy coping skills. Next patients were given a blank worksheet titled "Coping Skills A-Z". Patients were instructed to come up with at least one positive coping skill per letter of the alphabet. Patients were given 15 minutes to complete task, before ideas were presented to the large group. Patients and LRT debriefed on the importance of coping skill selection based on situation and back-up plans when a skill tried is not effective.    Affect/Mood: N/A   Participation Level: Did not attend    Clinical Observations/Individualized Feedback:     Plan: Continue to engage patient in RT group sessions 2-3x/week.   Caroll Rancher, LRT,CTRS  01/11/2022 11:43 AM

## 2022-01-11 NOTE — Progress Notes (Signed)
Psychoeducational Group Note  Date:  01/11/2022 Time:  2100  Group Topic/Focus:  Wrap-Up Group:   The focus of this group is to help patients review their daily goal of treatment and discuss progress on daily workbooks.  Participation Level: Did Not Attend  Participation Quality:  Not Applicable  Affect:  Not Applicable  Cognitive:  Not Applicable  Insight:  Not Applicable  Engagement in Group: Not Applicable  Additional Comments:  The patient did not attend group this evening.   Nazair Fortenberry S 01/11/2022, 9:00 PM

## 2022-01-11 NOTE — Progress Notes (Signed)
°   01/11/22 2000  Psych Admission Type (Psych Patients Only)  Admission Status Involuntary  Psychosocial Assessment  Patient Complaints Suspiciousness  Eye Contact Avoids  Facial Expression Flat  Affect Flat  Speech Soft  Interaction Cautious  Motor Activity Slow  Appearance/Hygiene Body odor;Disheveled;Poor hygiene  Behavior Characteristics Cooperative  Mood Depressed;Preoccupied  Thought Process  Coherency Blocking  Content Paranoia  Delusions Paranoid  Perception Hallucinations  Hallucination Auditory;Visual  Judgment Poor  Confusion None  Danger to Self  Current suicidal ideation? Denies  Self-Injurious Behavior No self-injurious ideation or behavior indicators observed or expressed   Agreement Not to Harm Self Yes  Description of Agreement Verbal Contract  Danger to Others  Danger to Others None reported or observed

## 2022-01-12 ENCOUNTER — Encounter (HOSPITAL_COMMUNITY): Payer: Self-pay

## 2022-01-12 DIAGNOSIS — F200 Paranoid schizophrenia: Secondary | ICD-10-CM | POA: Diagnosis not present

## 2022-01-12 MED ORDER — HALOPERIDOL DECANOATE 100 MG/ML IM SOLN
100.0000 mg | INTRAMUSCULAR | Status: DC
  Administered 2022-01-13: 100 mg via INTRAMUSCULAR
  Filled 2022-01-12: qty 1

## 2022-01-12 MED ORDER — HALOPERIDOL DECANOATE 100 MG/ML IM SOLN
100.0000 mg | INTRAMUSCULAR | Status: DC
  Filled 2022-01-12: qty 1

## 2022-01-12 NOTE — Progress Notes (Signed)
°   01/12/22 0545  Sleep  Number of Hours 8.75

## 2022-01-12 NOTE — Group Note (Signed)
Recreation Therapy Group Note   Group Topic:Team Building  Group Date: 01/12/2022 Start Time: 1000 End Time: 1020 Facilitators: Caroll Rancher, LRT,CTRS Location: [address] Dayroom   Goal Area(s) Addresses:  Patient will effectively work with peer towards shared goal.  Patient will identify skills used to make activity successful.  Patient will identify how skills used during activity can be used to reach post d/c goals.    Group Description:  Landing Pad. In teams of 3-5, patients were given 12 plastic drinking straws and an equal length of masking tape. Using the materials provided, patients were asked to build a landing pad to catch a golf ball dropped from approximately 5 feet in the air. All materials were required to be used by the team in their design. LRT facilitated post-activity discussion.   Affect/Mood: N/A   Participation Level: Did not attend    Clinical Observations/Individualized Feedback:     Plan: Continue to engage patient in RT group sessions 2-3x/week.   Caroll Rancher, LRT,CTRS 01/12/2022 11:57 AM

## 2022-01-12 NOTE — Progress Notes (Signed)
°   01/12/22 1200  Psych Admission Type (Psych Patients Only)  Admission Status Involuntary  Psychosocial Assessment  Patient Complaints Suspiciousness  Eye Contact Avoids  Facial Expression Flat  Affect Flat  Speech Soft  Interaction Cautious  Motor Activity Slow  Appearance/Hygiene Body odor;Disheveled;Poor hygiene  Behavior Characteristics Cooperative  Mood Preoccupied  Thought Process  Coherency Blocking  Content Paranoia  Delusions Paranoid  Perception Hallucinations  Hallucination Auditory;Visual  Judgment Poor  Confusion None  Danger to Self  Current suicidal ideation? Denies  Self-Injurious Behavior No self-injurious ideation or behavior indicators observed or expressed   Agreement Not to Harm Self Yes  Description of Agreement Verbal Contract  Danger to Others  Danger to Others None reported or observed

## 2022-01-12 NOTE — Progress Notes (Signed)
°   01/12/22 2000  Psych Admission Type (Psych Patients Only)  Admission Status Involuntary  Psychosocial Assessment  Patient Complaints Suspiciousness  Eye Contact Avoids  Facial Expression Flat  Affect Flat  Speech Soft  Interaction Cautious  Motor Activity Slow  Appearance/Hygiene Body odor;Disheveled;Poor hygiene  Behavior Characteristics Cooperative  Mood Preoccupied  Thought Process  Coherency Blocking  Content Paranoia  Delusions Paranoid  Perception Hallucinations  Hallucination Auditory;Visual  Judgment Poor  Confusion None  Danger to Self  Current suicidal ideation? Denies  Self-Injurious Behavior No self-injurious ideation or behavior indicators observed or expressed   Agreement Not to Harm Self Yes  Description of Agreement Verbal Contract  Danger to Others  Danger to Others None reported or observed

## 2022-01-12 NOTE — BH IP Treatment Plan (Signed)
Interdisciplinary Treatment and Diagnostic Plan Update  01/12/2022 Marshaun Lortie MRN: [id number]  Principal Diagnosis: Schizophrenia, paranoid (HCC)  Secondary Diagnoses: Principal Problem:   Schizophrenia, paranoid (HCC)   Current Medications:  Current Facility-Administered Medications  Medication Dose Route Frequency Provider Last Rate Last Admin   acetaminophen (TYLENOL) tablet 650 mg  650 mg Oral Q6H PRN Lauro Franklin, MD       alum & mag hydroxide-simeth (MAALOX/MYLANTA) 200-200-20 MG/5ML suspension 30 mL  30 mL Oral Q4H PRN Lauro Franklin, MD       docusate sodium (COLACE) capsule 100 mg  100 mg Oral BID Massengill, Harrold Donath, MD   100 mg at 01/07/22 1652   FLUoxetine (PROZAC) capsule 40 mg  40 mg Oral Daily Massengill, Harrold Donath, MD   40 mg at 01/12/22 0802   haloperidol (HALDOL) tablet 7.5 mg  7.5 mg Oral BID Armandina Stammer I, NP   7.5 mg at 01/12/22 0802   hydrOXYzine (ATARAX) tablet 25 mg  25 mg Oral TID PRN Lauro Franklin, MD   25 mg at 01/09/22 2045   LORazepam (ATIVAN) tablet 0.5 mg  0.5 mg Oral TID Armandina Stammer I, NP   0.5 mg at 01/12/22 0802   OLANZapine zydis (ZYPREXA) disintegrating tablet 5 mg  5 mg Oral Q8H PRN Lauro Franklin, MD   5 mg at 01/05/22 2423   And   LORazepam (ATIVAN) tablet 1 mg  1 mg Oral PRN Lauro Franklin, MD       And   ziprasidone (GEODON) injection 20 mg  20 mg Intramuscular PRN Lauro Franklin, MD       magnesium hydroxide (MILK OF MAGNESIA) suspension 30 mL  30 mL Oral Daily PRN Lauro Franklin, MD       OLANZapine (ZYPREXA) tablet 10 mg  10 mg Oral BID Massengill, Harrold Donath, MD   10 mg at 01/12/22 0802   traZODone (DESYREL) tablet 50 mg  50 mg Oral QHS PRN Lauro Franklin, MD   50 mg at 01/10/22 2048   PTA Medications: No medications prior to admission.    Patient Stressors: Marital or family conflict   Medication change or noncompliance    Patient Strengths: Motivation for treatment/growth   Supportive family/friends   Treatment Modalities: Medication Management, Group therapy, Case management,  1 to 1 session with clinician, Psychoeducation, Recreational therapy.   Physician Treatment Plan for Primary Diagnosis: Schizophrenia, paranoid (HCC) Long Term Goal(s): Improvement in symptoms so as ready for discharge   Short Term Goals: Ability to identify and develop effective coping behaviors will improve Ability to maintain clinical measurements within normal limits will improve Compliance with prescribed medications will improve Ability to identify changes in lifestyle to reduce recurrence of condition will improve Ability to verbalize feelings will improve Ability to disclose and discuss suicidal ideas Ability to demonstrate self-control will improve  Medication Management: Evaluate patient's response, side effects, and tolerance of medication regimen.  Therapeutic Interventions: 1 to 1 sessions, Unit Group sessions and Medication administration.  Evaluation of Outcomes: Progressing  Physician Treatment Plan for Secondary Diagnosis: Principal Problem:   Schizophrenia, paranoid (HCC)  Long Term Goal(s): Improvement in symptoms so as ready for discharge   Short Term Goals: Ability to identify and develop effective coping behaviors will improve Ability to maintain clinical measurements within normal limits will improve Compliance with prescribed medications will improve Ability to identify changes in lifestyle to reduce recurrence of condition will improve Ability to verbalize feelings will improve Ability to  disclose and discuss suicidal ideas Ability to demonstrate self-control will improve     Medication Management: Evaluate patient's response, side effects, and tolerance of medication regimen.  Therapeutic Interventions: 1 to 1 sessions, Unit Group sessions and Medication administration.  Evaluation of Outcomes: Progressing   RN Treatment Plan for Primary  Diagnosis: Schizophrenia, paranoid (HCC) Long Term Goal(s): Knowledge of disease and therapeutic regimen to maintain health will improve  Short Term Goals: Ability to participate in decision making will improve, Ability to verbalize feelings will improve, and Ability to identify and develop effective coping behaviors will improve  Medication Management: RN will administer medications as ordered by provider, will assess and evaluate patient's response and provide education to patient for prescribed medication. RN will report any adverse and/or side effects to prescribing provider.  Therapeutic Interventions: 1 on 1 counseling sessions, Psychoeducation, Medication administration, Evaluate responses to treatment, Monitor vital signs and CBGs as ordered, Perform/monitor CIWA, COWS, AIMS and Fall Risk screenings as ordered, Perform wound care treatments as ordered.  Evaluation of Outcomes: Progressing   LCSW Treatment Plan for Primary Diagnosis: Schizophrenia, paranoid (HCC) Long Term Goal(s): Safe transition to appropriate next level of care at discharge, Engage patient in therapeutic group addressing interpersonal concerns.  Short Term Goals: Engage patient in aftercare planning with referrals and resources, Increase emotional regulation, and Facilitate acceptance of mental health diagnosis and concerns  Therapeutic Interventions: Assess for all discharge needs, 1 to 1 time with Social worker, Explore available resources and support systems, Assess for adequacy in community support network, Educate family and significant other(s) on suicide prevention, Complete Psychosocial Assessment, Interpersonal group therapy.  Evaluation of Outcomes: Progressing   Progress in Treatment: Attending groups: Yes. Participating in groups: Yes. Taking medication as prescribed: Yes. Toleration medication: Yes. Family/Significant other contact made: Yes, individual(s) contacted:  mom Patient understands  diagnosis: No. Discussing patient identified problems/goals with staff: Yes. Medical problems stabilized or resolved: Yes. Denies suicidal/homicidal ideation: Yes. Issues/concerns per patient self-inventory: No. Other: None  New problem(s) identified: No, Describe:  None  New Short Term/Long Term Goal(s):medication stabilization, elimination of SI thoughts, development of comprehensive mental wellness plan.   Patient Goals:  "To get on medications"  Discharge Plan or Barriers: Pt has been referred to [facility].   Reason for Continuation of Hospitalization: Delusions  Hallucinations Medication stabilization  Estimated Length of Stay: 3-5 days   Scribe for Treatment Team: Chrys Racer 01/12/2022 12:01 PM

## 2022-01-12 NOTE — [facility] Group Notes (Signed)
Spirituality group facilitated by Chaplain Katy Betania Dizon, B[facility].   Group Description: Group focused on topic of hope. Patients participated in facilitated discussion around topic, connecting with one another around experiences and definitions for hope. Group members engaged with visual explorer photos, reflecting on what hope looks like for them today. Group engaged in discussion around how their definitions of hope are present today in hospital.   Modalities: Psycho-social ed, Adlerian, Narrative, MI   Patient Progress: Did not attend.  

## 2022-01-12 NOTE — Group Note (Signed)
LCSW Group Therapy Note ° ° °Group Date: 01/12/2022 °Start Time: 1100 °End Time: 1200 ° °Type of Therapy and Topic:  Group Therapy:  Stress Management °  °Participation Level:  Did Not Attend  °  °Description of Group:  Patients in this group were introduced to the idea of stress and encouraged to discuss negative and positive ways to manage stress. Patients discussed specific stressors that they have in their life right now and the physical signs and symptoms associated with that stress.  Patient encouraged to come up with positive changes to assist with the stress upon discharge in order to prevent future hospitalizations.   They also worked as a group on developing a specific plan for several patients to deal with stressors through boundary-setting, psychoeducation and self care techniques °  °Therapeutic Goals: °  °            1)  To discuss the positive and negative impacts of stress °            2)  identify signs and symptoms of stress °            3)  generate ideas for stress management °            4)  offer mutual support to others regarding stress management °            5)  Developing plans for ways to manage specific stressors upon discharge °            °  °Summary of Patient Progress:  Did not attend °  °Therapeutic Modalities:   °Motivational Interviewing °Brief Solution-Focused Therapy ° °

## 2022-01-12 NOTE — Progress Notes (Signed)
[facility] MD Progress Note  01/12/2022 5:10 PM Miguel Shea  MRN:  [id number]  Subjective: Miguel Shea reports, "My mood is good, just a little depressed because of what is going on in my head. I over think. And to help with that, I need to be on the right medicines. The auditory hallucinations are still there, but not saying much. I tend to ignore them". Daily notes 01-12-22: Miguel Shea is seen in his room. He is lying down in bed, but able to sit when prompted to do so. He presents with a much improved affect. He is verbally responsive, making a good eye contact. He seem to be doing better today than how he appeared/presented yesterday. He is groomed, has changed his clothes. He is wearing a clean pair of jeans & a t-shirt. He says it is going well today. He continues to endorse AH, saying the voices are still all in my head but not saying much. Miguel Shea thinks he is benefiting from his treatment regimen. Denies any side effects. He did ask if any group session is going on, was able to come out of his room to the day room & went back to his room. Says he still does come out of his room for meals & during medication administration. He currently denies any SIHI, VH, delusional thoughts or paranoia. He does not appear to be responding to any internal stimuli. He is instructed & encouraged to come to the staff whenever he feels unsafe or the voices worsen. He was able to contract for safety verbally.  We have adjusted his Haldol & Ativan doses to meet his needs. here are no behavioral issues displayed or reported by staff. Miguel Shea is encouraged to take his medication as recommended. He denies any side effects from his medications. Reviewed vital signs (stable). Reviewed current lab results (stable). Has agreed to the [facility] & will start on Haldol Dec 100 mg IM Q 30 days tomorrow.  Reason for admission: 26 year old Caucasian male with hx of mental health issues (Schizophrenia/bipolar disorder). He walked-in to the [facility]  yesterday with complaint of command auditory hallucinations telling him to do things & other times, the voices do sound repetitive such as think, think that. He reported that his hallucination is more or less a distorted reality of particles that seem different than what they are.  Principal Problem: Schizophrenia, paranoid ([place])  Diagnosis: Principal Problem:   Schizophrenia, paranoid ([place])  Total Time spent with patient:  35 minutes  Past Psychiatric History: Schizophrenia.  Past Medical History:  Past Medical History:  Diagnosis Date   Bipolar 1 disorder ([place])    Wolff-Parkinson-White syndrome    History reviewed. No pertinent surgical history.  Family History: History reviewed. No pertinent family history.  Family Psychiatric  History: See H&P.  Social History:  Social History   Substance and Sexual Activity  Alcohol Use Never     Social History   Substance and Sexual Activity  Drug Use Never    Social History   Socioeconomic History   Marital status: Single    Spouse name: Not on file   Number of children: Not on file   Years of education: Not on file   Highest education level: Not on file  Occupational History   Not on file  Tobacco Use   Smoking status: Never   Smokeless tobacco: Never  Vaping Use   Vaping Use: Some days   Substances: Nicotine, Flavoring  Substance and Sexual Activity   Alcohol use: Never  Drug use: Never   Sexual activity: Not on file  Other Topics Concern   Not on file  Social History Narrative   ** Merged History Encounter **       Social Determinants of Health   Financial Resource Strain: Not on file  Food Insecurity: Not on file  Transportation Needs: Not on file  Physical Activity: Not on file  Stress: Not on file  Social Connections: Not on file   Additional Social History:   Sleep: Good  Appetite:  Good  Current Medications: Current Facility-Administered Medications  Medication Dose Route Frequency Provider  Last Rate Last Admin   acetaminophen (TYLENOL) tablet 650 mg  650 mg Oral Q6H PRN Briant Cedar, MD       alum & mag hydroxide-simeth (MAALOX/MYLANTA) 200-200-20 MG/5ML suspension 30 mL  30 mL Oral Q4H PRN Briant Cedar, MD       docusate sodium (COLACE) capsule 100 mg  100 mg Oral BID Massengill, Ovid Curd, MD   100 mg at 01/07/22 1652   FLUoxetine (PROZAC) capsule 40 mg  40 mg Oral Daily Massengill, Ovid Curd, MD   40 mg at 01/12/22 0802   haloperidol (HALDOL) tablet 7.5 mg  7.5 mg Oral BID Lindell Spar I, NP   7.5 mg at 01/12/22 0802   hydrOXYzine (ATARAX) tablet 25 mg  25 mg Oral TID PRN Briant Cedar, MD   25 mg at 01/09/22 2045   LORazepam (ATIVAN) tablet 0.5 mg  0.5 mg Oral TID Lindell Spar I, NP   0.5 mg at 01/12/22 1635   OLANZapine zydis (ZYPREXA) disintegrating tablet 5 mg  5 mg Oral Q8H PRN Briant Cedar, MD   5 mg at 01/05/22 [id number]   And   LORazepam (ATIVAN) tablet 1 mg  1 mg Oral PRN Briant Cedar, MD       And   ziprasidone (GEODON) injection 20 mg  20 mg Intramuscular PRN Briant Cedar, MD       magnesium hydroxide (MILK OF MAGNESIA) suspension 30 mL  30 mL Oral Daily PRN Briant Cedar, MD       OLANZapine (ZYPREXA) tablet 10 mg  10 mg Oral BID Massengill, Ovid Curd, MD   10 mg at 01/12/22 0802   traZODone (DESYREL) tablet 50 mg  50 mg Oral QHS PRN Briant Cedar, MD   50 mg at 01/10/22 2048   Lab Results:  No results found for this or any previous visit (from the past 41 hour(s)).  Blood Alcohol level:  Lab Results  Component Value Date   ETH <10 01/02/2022   ETH <10 123XX123   Metabolic Disorder Labs: Lab Results  Component Value Date   HGBA1C 4.6 (L) 01/04/2022   MPG 85.32 01/04/2022   No results found for: PROLACTIN Lab Results  Component Value Date   CHOL 148 01/04/2022   TRIG 104 01/04/2022   HDL 46 01/04/2022   CHOLHDL 3.2 01/04/2022   VLDL 21 01/04/2022   LDLCALC 81 01/04/2022   Physical  Findings: AIMS: Facial and Oral Movements Muscles of Facial Expression: None, normal Lips and Perioral Area: None, normal Jaw: None, normal Tongue: None, normal,Extremity Movements Upper (arms, wrists, hands, fingers): None, normal Lower (legs, knees, ankles, toes): None, normal, Trunk Movements Neck, shoulders, hips: None, normal, Overall Severity Severity of abnormal movements (highest score from questions above): None, normal Incapacitation due to abnormal movements: None, normal Patient's awareness of abnormal movements (rate only patient's report): No Awareness, Dental Status Current  problems with teeth and/or dentures?: No Does patient usually wear dentures?: No  CIWA:    COWS:     Musculoskeletal: Strength & Muscle Tone: within normal limits Gait & Station: normal Patient leans: N/A  Psychiatric Specialty Exam:  Presentation  General Appearance: Casual (Improved grooming).)  Eye Contact:Fair; Other (comment) (Patient makes a fair eye contact when prompted.)  Speech:Clear and Coherent; Slow  Speech Volume:Decreased  Handedness:Right   Mood and Affect  Mood:Anxious; Depressed (With some improvememnt noted.)  Affect:Blunt  Thought Process  Thought Processes:Coherent; Goal Directed  Descriptions of Associations:Intact  Orientation:Full (Time, Place and Person)  Thought Content:Paranoid Ideation  History of Schizophrenia/Schizoaffective disorder:Yes  Duration of Psychotic Symptoms:Greater than six months  Hallucinations: "Yes, the voices are there, but not saying much".  Ideas of Reference:Paranoia  Suicidal Thoughts: Denies.  Homicidal Thoughts: Denies.  Sensorium  Memory:Immediate Fair; Recent Fair; Remote Fair  Judgment:Fair  Insight:Fair  Executive Functions  Concentration:Fair  Attention Span:Fair  [place]  Psychomotor Activity  Psychomotor Activity:Psychomotor Activity:  Decreased   Assets  Assets:Communication Skills; Desire for Improvement; Housing; Physical Health; Resilience; Social Support  Sleep  Sleep: 7.5 hours (Per documentation).  Physical Exam: Physical Exam Vitals and nursing note reviewed.  HENT:     Nose: Nose normal.     Mouth/Throat:     Pharynx: Oropharynx is clear.  Cardiovascular:     Rate and Rhythm: Normal rate.     Pulses: Normal pulses.  Pulmonary:     Effort: Pulmonary effort is normal.  Genitourinary:    Comments: Deferred Musculoskeletal:        General: Normal range of motion.  Skin:    General: Skin is warm and dry.  Neurological:     General: No focal deficit present.     Mental Status: He is oriented to person, place, and time.   Review of Systems  Constitutional:  Negative for chills, diaphoresis and fever.  HENT:  Negative for congestion and sore throat.   Eyes:  Negative for blurred vision.  Respiratory:  Negative for cough, shortness of breath and wheezing.   Cardiovascular:  Negative for chest pain and palpitations.  Gastrointestinal:  Negative for abdominal pain, constipation, diarrhea, heartburn, nausea and vomiting.  Genitourinary:  Negative for dysuria.  Musculoskeletal:  Negative for falls and myalgias.  Neurological:  Negative for dizziness, tingling, tremors, sensory change, speech change, focal weakness, seizures, loss of consciousness, weakness and headaches.  Endo/Heme/Allergies:        Allergies: Augmentin  Blood pressure 138/81, pulse 84, temperature 97.9 F (36.6 C), temperature source Oral, resp. rate 18, height 6' 1.23" (1.86 m), weight 94.1 kg, SpO2 99 %. Body mass index is 27.19 kg/m.  Treatment Plan Summary: Daily contact with patient to assess and evaluate symptoms and progress in treatment and Medication management.  Continue inpatient hospitalization.  Will continue today 01/12/2022 plan as below except where it is noted.   Treatment Plan/Recommendations: 1. Admit for crisis  management and stabilization, estimated length of stay 3-5 days.  Diagnoses: Schizophrenia.   Plan: 2. Medication management to reduce current symptoms to base line and improve the patient's overall level of functioning: See [facility] for plan of care.  -Continue Zyprexa 10 mg po bid for Schizophrenia.  -Continue Haldol 7.5 mg po bid. -Initiated Haldol decanoate 100 mg IM Q 30 days starting 01-13-22. -Continue lorazepam 0.5 mg to tid for anxiety/catatonia. -Continue Fluoxetine 40 mg po daily for depression. -Continue Trazodone 50 mg po  Q hs prn for insomnia. -Continue the agitation protocol as recommended prn for agitation/psychosis. -Continue all other prn medications as recommended for anxiety, pain/fever, indigestion. -Continue  Colace 100 mg po bid for constipation.  Encourage group participation. Discharge disposition plan is ongoing.  Lindell Spar, NP, pmhnp, fnp-bc 01/12/2022, 5:10 PM Patient ID: Miguel Shea, male   DOB: August 17, 1996, 26 y.o.   MRN: [id number] Patient ID: Miguel Shea, male   DOB: 07/30/1996, 26 y.o.   MRN: [id number] Patient ID: Miguel Shea, male   DOB: Aug 15, 1996, 26 y.o.   MRN: [id number] Patient ID: Miguel Shea, male   DOB: 09-02-1996, 26 y.o.   MRN: [id number]

## 2022-01-13 NOTE — Group Note (Signed)
LCSW Group Therapy Note 01/13/2022  11:15am-12:00pm  Type of Therapy and Topic:  Group Therapy: Anger and Commonalities  Participation Level:  Did Not Attend   Description of Group: In this group, patients initially shared an "unknown" fact about themselves and CSW led a discussion about the ways in which we have things in common without realizing it.  Patient then identified a recent time they became angry and how this yet again showed a way in which they had something in common with other patients.  We discussed possible unhealthy reactions to anger and possible healthy reactions.  We also discussed possible underlying emotions that lead to the anger.  Commonalities among group members were pointed out throughout the entirety of group.  Therapeutic Goals: Patients were asked to share something about themselves and learned that they often have things in common with other people without knowing this Patients will remember an incident of anger and how they reacted Patients will be able to identify their reaction as healthy or unhealthy, and identify possible reactions that would have been the opposite Patients will learn that anger itself is a secondary emotion and will think about their primary emotion at the time of their last incident of anger  Summary of Patient Progress:  The patient was invited to group, did not attend.  Therapeutic Modalities:   Cognitive Behavioral Therapy  Lynnell Chad, LCSW 01/13/2022  12:00 PM

## 2022-01-13 NOTE — Progress Notes (Signed)
[facility] MD Progress Note  01/13/2022 3:19 PM Miguel Shea  MRN:  [id number]  Subjective: Miguel Shea reports, "I'm feeling alright. I'm a little bit depressed because I'm sitting around waiting to hear something about discharge. Am I going to have an outpatient appointment after discharge?   Daily notes 01-13-22: Miguel Shea is seen in his room. He is sitting up in bed. He presents with a much improved affect. He is verbally responsive, making a good eye contact. He seem to be improving well. He is groomed, dressed appropriately. He is wearing a clean pair of jeans & a t-shirt. He says it is going well today. He continues to endorse AH, saying the voices are still all in my head but not saying much. Miguel Shea thinks he is benefiting from his treatment regimen. Denies any side effects. He currently denies any SIHI, VH, delusional thoughts or paranoia. He does not appear to be responding to any internal stimuli. He is instructed & encouraged to come to the staff whenever he feels unsafe or the voices worsen. He was able to contract for safety verbally.  We have adjusted his medications to meet his needs. There are no behavioral issues displayed or reported by staff. Miguel Shea is encouraged to take his medication as recommended. He denies any side effects from his medications. Reviewed vital signs (stable). Reviewed current lab results (stable). Took his first dose of the Haldol dec 100 mg IM today. Tolerating it well at this point.  Reason for admission: 26 year old Caucasian male with hx of mental health issues (Schizophrenia/bipolar disorder). He walked-in to the [facility] yesterday with complaint of command auditory hallucinations telling him to do things & other times, the voices do sound repetitive such as think, think that. He reported that his hallucination is more or less a distorted reality of particles that seem different than what they are.  Principal Problem: Schizophrenia, paranoid ([place])  Diagnosis: Principal  Problem:   Schizophrenia, paranoid ([place])  Total Time spent with patient:  35 minutes  Past Psychiatric History: Schizophrenia.  Past Medical History:  Past Medical History:  Diagnosis Date   Bipolar 1 disorder ([place])    Wolff-Parkinson-White syndrome    History reviewed. No pertinent surgical history.  Family History: History reviewed. No pertinent family history.  Family Psychiatric  History: See H&P.  Social History:  Social History   Substance and Sexual Activity  Alcohol Use Never     Social History   Substance and Sexual Activity  Drug Use Never    Social History   Socioeconomic History   Marital status: Single    Spouse name: Not on file   Number of children: Not on file   Years of education: Not on file   Highest education level: Not on file  Occupational History   Not on file  Tobacco Use   Smoking status: Never   Smokeless tobacco: Never  Vaping Use   Vaping Use: Some days   Substances: Nicotine, Flavoring  Substance and Sexual Activity   Alcohol use: Never   Drug use: Never   Sexual activity: Not on file  Other Topics Concern   Not on file  Social History Narrative   ** Merged History Encounter **       Social Determinants of Health   Financial Resource Strain: Not on file  Food Insecurity: Not on file  Transportation Needs: Not on file  Physical Activity: Not on file  Stress: Not on file  Social Connections: Not on file   Additional  Social History:   Sleep: Good  Appetite:  Good  Current Medications: Current Facility-Administered Medications  Medication Dose Route Frequency Provider Last Rate Last Admin   acetaminophen (TYLENOL) tablet 650 mg  650 mg Oral Q6H PRN Lauro Franklin, MD       alum & mag hydroxide-simeth (MAALOX/MYLANTA) 200-200-20 MG/5ML suspension 30 mL  30 mL Oral Q4H PRN Lauro Franklin, MD       docusate sodium (COLACE) capsule 100 mg  100 mg Oral BID Massengill, Harrold Donath, MD   100 mg at 01/07/22 1652    FLUoxetine (PROZAC) capsule 40 mg  40 mg Oral Daily Massengill, Harrold Donath, MD   40 mg at 01/13/22 0950   haloperidol (HALDOL) tablet 7.5 mg  7.5 mg Oral BID Armandina Stammer I, Miguel Shea   7.5 mg at 01/13/22 0951   haloperidol decanoate (HALDOL DECANOATE) 100 MG/ML injection 100 mg  100 mg Intramuscular Q30 days Bobbitt, Shalon E, Miguel Shea   100 mg at 01/13/22 0954   hydrOXYzine (ATARAX) tablet 25 mg  25 mg Oral TID PRN Lauro Franklin, MD   25 mg at 01/09/22 2045   LORazepam (ATIVAN) tablet 0.5 mg  0.5 mg Oral TID Armandina Stammer I, Miguel Shea   0.5 mg at 01/13/22 1310   OLANZapine zydis (ZYPREXA) disintegrating tablet 5 mg  5 mg Oral Q8H PRN Lauro Franklin, MD   5 mg at 01/05/22 4268   And   LORazepam (ATIVAN) tablet 1 mg  1 mg Oral PRN Lauro Franklin, MD       And   ziprasidone (GEODON) injection 20 mg  20 mg Intramuscular PRN Lauro Franklin, MD       magnesium hydroxide (MILK OF MAGNESIA) suspension 30 mL  30 mL Oral Daily PRN Lauro Franklin, MD       OLANZapine (ZYPREXA) tablet 10 mg  10 mg Oral BID Phineas Inches, MD   10 mg at 01/13/22 0951   traZODone (DESYREL) tablet 50 mg  50 mg Oral QHS PRN Lauro Franklin, MD   50 mg at 01/12/22 2057   Lab Results:  No results found for this or any previous visit (from the past 48 hour(s)).  Blood Alcohol level:  Lab Results  Component Value Date   ETH <10 01/02/2022   ETH <10 10/16/2021   Metabolic Disorder Labs: Lab Results  Component Value Date   HGBA1C 4.6 (L) 01/04/2022   MPG 85.32 01/04/2022   No results found for: PROLACTIN Lab Results  Component Value Date   CHOL 148 01/04/2022   TRIG 104 01/04/2022   HDL 46 01/04/2022   CHOLHDL 3.2 01/04/2022   VLDL 21 01/04/2022   LDLCALC 81 01/04/2022   Physical Findings: AIMS: Facial and Oral Movements Muscles of Facial Expression: None, normal Lips and Perioral Area: None, normal Jaw: None, normal Tongue: None, normal,Extremity Movements Upper (arms, wrists, hands,  fingers): None, normal Lower (legs, knees, ankles, toes): None, normal, Trunk Movements Neck, shoulders, hips: None, normal, Overall Severity Severity of abnormal movements (highest score from questions above): None, normal Incapacitation due to abnormal movements: None, normal Patient's awareness of abnormal movements (rate only patient's report): No Awareness, Dental Status Current problems with teeth and/or dentures?: No Does patient usually wear dentures?: No  CIWA:    COWS:     Musculoskeletal: Strength & Muscle Tone: within normal limits Gait & Station: normal Patient leans: N/A  Psychiatric Specialty Exam:  Presentation  General Appearance: Casual (Improved grooming).)  Eye Contact:Fair;  Other (comment) (Patient makes a fair eye contact when prompted.)  Speech:Clear and Coherent; Slow  Speech Volume:Decreased  Handedness:Right   Mood and Affect  Mood:Anxious; Depressed (With some improvememnt noted.)  Affect:Blunt  Thought Process  Thought Processes:Coherent; Goal Directed  Descriptions of Associations:Intact  Orientation:Full (Time, Place and Person)  Thought Content:Paranoid Ideation  History of Schizophrenia/Schizoaffective disorder:Yes  Duration of Psychotic Symptoms:Greater than six months  Hallucinations: "Yes, the voices are there, but not saying much".  Ideas of Reference:Paranoia  Suicidal Thoughts: Denies.  Homicidal Thoughts: Denies.  Sensorium  Memory:Immediate Fair; Recent Fair; Remote Fair  Judgment:Fair  Insight:Fair  Executive Functions  Concentration:Fair  Attention Span:Fair  [place]  Psychomotor Activity  Psychomotor Activity:No data recorded  Assets  Assets:Communication Skills; Desire for Improvement; Housing; Physical Health; Resilience; Social Support  Sleep  Sleep: 7.5 hours (Per documentation).  Physical Exam: Physical Exam Vitals and nursing note reviewed.   HENT:     Nose: Nose normal.     Mouth/Throat:     Pharynx: Oropharynx is clear.  Eyes:     Pupils: Pupils are equal, round, and reactive to light.  Cardiovascular:     Rate and Rhythm: Normal rate.     Pulses: Normal pulses.  Pulmonary:     Effort: Pulmonary effort is normal.  Genitourinary:    Comments: Deferred Musculoskeletal:        General: Normal range of motion.  Skin:    General: Skin is warm and dry.  Neurological:     General: No focal deficit present.     Mental Status: He is alert and oriented to person, place, and time.   Review of Systems  Constitutional:  Negative for chills, diaphoresis and fever.  HENT:  Negative for congestion and sore throat.   Eyes:  Negative for blurred vision.  Respiratory:  Negative for cough, shortness of breath and wheezing.   Cardiovascular:  Negative for chest pain and palpitations.  Gastrointestinal:  Negative for abdominal pain, constipation, diarrhea, heartburn, nausea and vomiting.  Genitourinary:  Negative for dysuria.  Musculoskeletal:  Negative for falls and myalgias.  Neurological:  Negative for dizziness, tingling, tremors, sensory change, speech change, focal weakness, seizures, loss of consciousness, weakness and headaches.  Endo/Heme/Allergies:        Allergies: Augmentin  Blood pressure 138/81, pulse 84, temperature 97.9 F (36.6 C), temperature source Oral, resp. rate 18, height 6' 1.23" (1.86 m), weight 94.1 kg, SpO2 99 %. Body mass index is 27.19 kg/m.  Treatment Plan Summary: Daily contact with patient to assess and evaluate symptoms and progress in treatment and Medication management.  Continue inpatient hospitalization.  Will continue today 01/13/2022 plan as below except where it is noted.   Treatment Plan/Recommendations: 1. Admit for crisis management and stabilization, estimated length of stay 3-5 days.  Diagnoses: Schizophrenia.   Plan: 2. Medication management to reduce current symptoms to base  line and improve the patient's overall level of functioning: See [facility] for plan of care.  -Continue Zyprexa 10 mg po bid for Schizophrenia.  -Continue Haldol 7.5 mg po bid. -Continue Haldol decanoate 100 mg IM Q 30 days. -Continue lorazepam 0.5 mg to tid for anxiety/catatonia. -Continue Fluoxetine 40 mg po daily for depression. -Continue Trazodone 50 mg po Q hs prn for insomnia. -Continue the agitation protocol as recommended prn for agitation/psychosis. -Continue all other prn medications as recommended for anxiety, pain/fever, indigestion. -Continue  Colace 100 mg po bid for constipation.  Encourage group participation.  Discharge disposition plan is ongoing.  Miguel Spar, Miguel Shea, Miguel Shea, Miguel Shea 01/13/2022, 3:19 PM Patient ID: Miguel Shea, male   DOB: 1996/03/21, 26 y.o.   MRN: [id number] Patient ID: Miguel Shea, male   DOB: Mar 12, 1996, 26 y.o.   MRN: [id number] Patient ID: Miguel Shea, male   DOB: [birth year], 26 y.o.   MRN: [id number] Patient ID: Miguel Shea, male   DOB: 1996-04-30, 26 y.o.   MRN: [id number] Patient ID: Miguel Shea, male   DOB: 01/26/1996, 26 y.o.   MRN: [id number]

## 2022-01-13 NOTE — Progress Notes (Signed)
Pt A & O to self, place and situation. Denies SI, HI, VH and pain when assessed. Continues to endorse +AH "Just a little bit. It has been better". Isolative to room majority of this shift. Attends to his personal hygiene. Remains medication compliant with adverse drug reactions. Tolerates all meals and fluids well. Safety maintained on and off unit at Q 15 minutes intervals. All scheduled medications administered with verbal education and effects monitored. Support and encouragement offered to pt.  Pt tolerates meals and medications well without discomfort. Denies concerns at this time.

## 2022-01-13 NOTE — B[facility] Group Notes (Signed)
Pt didn't attend group. 

## 2022-01-13 NOTE — Progress Notes (Signed)
°   01/13/22 0545  Sleep  Number of Hours 8.25

## 2022-01-14 NOTE — Progress Notes (Signed)
Adult Psychoeducational Group Note  Date:  01/14/2022 Time:  10:50 AM  Group Topic/Focus:  Goals Group:   The focus of this group is to help patients establish daily goals to achieve during treatment and discuss how the patient can incorporate goal setting into their daily lives to aide in recovery.  Participation Level:  Active  Participation Quality:  Attentive  Affect:  Flat  Cognitive:  Appropriate  Insight: Good  Engagement in Group:  Engaged  Modes of Intervention:  Education, Orientation, and Socialization  Additional Comments:  Pt attended goals group. Pt's goal for today is "to work on depression."  [name] 01/14/2022, 10:50 AM

## 2022-01-14 NOTE — Progress Notes (Signed)
[facility] MD Progress Note  01/14/2022 2:20 PM Miguel Shea  MRN:  [id number]  Subjective: Miguel Shea reports, "I'm doing good. My anxiety is a little bit high earlier. I have taken some Ativan, it is coming down. The voices are still in my head, not telling me to do anything".  Daily notes 01-13-22: Miguel Shea is seen in his room. He is sitting up in bed. He presents with a much improved affect. He is verbally responsive, making a good eye contact. He seem to be improving well. He is groomed, dressed appropriately in jeans & a T-shirt. He says it is going well today. He continues to endorse AH, saying the voices are still all in my head but not saying much. Miguel Shea thinks he is benefiting from his treatment regimen. Denies any side effects. He currently denies any SIHI, VH, delusional thoughts or paranoia. He does not appear to be responding to any internal stimuli. He is instructed & encouraged to come to the staff whenever he feels unsafe or the voices worsen. He was able to contract for safety verbally.  We have adjusted his medications to meet his needs. There are no behavioral issues displayed or reported by staff. Miguel Shea is encouraged to take his medication as recommended. He denies any side effects from his medications. Reviewed vital signs (stable). Reviewed current lab results (stable). Took his first dose of the Haldol dec 100 mg IM yesterday. Will be due again on February 13th, 2023. Tolerating it well at this point.  Reason for admission: 26 year old Caucasian male with hx of mental health issues (Schizophrenia/bipolar disorder). He walked-in to the [facility] yesterday with complaint of command auditory hallucinations telling him to do things & other times, the voices do sound repetitive such as think, think that. He reported that his hallucination is more or less a distorted reality of particles that seem different than what they are.  Principal Problem: Schizophrenia, paranoid ([place])  Diagnosis:  Principal Problem:   Schizophrenia, paranoid ([place])  Total Time spent with patient:  35 minutes  Past Psychiatric History: Schizophrenia.  Past Medical History:  Past Medical History:  Diagnosis Date   Bipolar 1 disorder ([place])    Wolff-Parkinson-White syndrome    History reviewed. No pertinent surgical history.  Family History: History reviewed. No pertinent family history.  Family Psychiatric  History: See H&P.  Social History:  Social History   Substance and Sexual Activity  Alcohol Use Never     Social History   Substance and Sexual Activity  Drug Use Never    Social History   Socioeconomic History   Marital status: Single    Spouse name: Not on file   Number of children: Not on file   Years of education: Not on file   Highest education level: Not on file  Occupational History   Not on file  Tobacco Use   Smoking status: Never   Smokeless tobacco: Never  Vaping Use   Vaping Use: Some days   Substances: Nicotine, Flavoring  Substance and Sexual Activity   Alcohol use: Never   Drug use: Never   Sexual activity: Not on file  Other Topics Concern   Not on file  Social History Narrative   ** Merged History Encounter **       Social Determinants of Health   Financial Resource Strain: Not on file  Food Insecurity: Not on file  Transportation Needs: Not on file  Physical Activity: Not on file  Stress: Not on file  Social Connections:  Not on file   Additional Social History:   Sleep: Good  Appetite:  Good  Current Medications: Current Facility-Administered Medications  Medication Dose Route Frequency Provider Last Rate Last Admin   acetaminophen (TYLENOL) tablet 650 mg  650 mg Oral Q6H PRN Pashayan, Mardelle Matte, MD       alum & mag hydroxide-simeth (MAALOX/MYLANTA) 200-200-20 MG/5ML suspension 30 mL  30 mL Oral Q4H PRN Lauro Franklin, MD       docusate sodium (COLACE) capsule 100 mg  100 mg Oral BID Massengill, Harrold Donath, MD   100 mg at 01/14/22  7209   FLUoxetine (PROZAC) capsule 40 mg  40 mg Oral Daily Massengill, Harrold Donath, MD   40 mg at 01/14/22 4709   haloperidol (HALDOL) tablet 7.5 mg  7.5 mg Oral BID Armandina Stammer I, NP   7.5 mg at 01/14/22 6283   haloperidol decanoate (HALDOL DECANOATE) 100 MG/ML injection 100 mg  100 mg Intramuscular Q30 days Bobbitt, Shalon E, NP   100 mg at 01/13/22 0954   hydrOXYzine (ATARAX) tablet 25 mg  25 mg Oral TID PRN Lauro Franklin, MD   25 mg at 01/09/22 2045   LORazepam (ATIVAN) tablet 0.5 mg  0.5 mg Oral TID Armandina Stammer I, NP   0.5 mg at 01/14/22 1312   OLANZapine zydis (ZYPREXA) disintegrating tablet 5 mg  5 mg Oral Q8H PRN Lauro Franklin, MD   5 mg at 01/05/22 6629   And   LORazepam (ATIVAN) tablet 1 mg  1 mg Oral PRN Lauro Franklin, MD       And   ziprasidone (GEODON) injection 20 mg  20 mg Intramuscular PRN Lauro Franklin, MD       magnesium hydroxide (MILK OF MAGNESIA) suspension 30 mL  30 mL Oral Daily PRN Lauro Franklin, MD       OLANZapine (ZYPREXA) tablet 10 mg  10 mg Oral BID Phineas Inches, MD   10 mg at 01/14/22 4765   traZODone (DESYREL) tablet 50 mg  50 mg Oral QHS PRN Lauro Franklin, MD   50 mg at 01/13/22 2035   Lab Results:  No results found for this or any previous visit (from the past 48 hour(s)).  Blood Alcohol level:  Lab Results  Component Value Date   ETH <10 01/02/2022   ETH <10 10/16/2021   Metabolic Disorder Labs: Lab Results  Component Value Date   HGBA1C 4.6 (L) 01/04/2022   MPG 85.32 01/04/2022   No results found for: PROLACTIN Lab Results  Component Value Date   CHOL 148 01/04/2022   TRIG 104 01/04/2022   HDL 46 01/04/2022   CHOLHDL 3.2 01/04/2022   VLDL 21 01/04/2022   LDLCALC 81 01/04/2022   Physical Findings: AIMS: Facial and Oral Movements Muscles of Facial Expression: None, normal Lips and Perioral Area: None, normal Jaw: None, normal Tongue: None, normal,Extremity Movements Upper (arms, wrists,  hands, fingers): None, normal Lower (legs, knees, ankles, toes): None, normal, Trunk Movements Neck, shoulders, hips: None, normal, Overall Severity Severity of abnormal movements (highest score from questions above): None, normal Incapacitation due to abnormal movements: None, normal Patient's awareness of abnormal movements (rate only patient's report): No Awareness, Dental Status Current problems with teeth and/or dentures?: No Does patient usually wear dentures?: No  CIWA:    COWS:     Musculoskeletal: Strength & Muscle Tone: within normal limits Gait & Station: normal Patient leans: N/A  Psychiatric Specialty Exam:  Presentation  General Appearance:  Casual (Improved grooming).)  Eye Contact:Fair; Other (comment) (Patient makes a fair eye contact when prompted.)  Speech:Clear and Coherent; Slow  Speech Volume:Decreased  Handedness:Right   Mood and Affect  Mood:Anxious; Depressed (With some improvememnt noted.)  Affect:Blunt  Thought Process  Thought Processes:Coherent; Goal Directed  Descriptions of Associations:Intact  Orientation:Full (Time, Place and Person)  Thought Content:Paranoid Ideation  History of Schizophrenia/Schizoaffective disorder:Yes  Duration of Psychotic Symptoms:Greater than six months  Hallucinations: "Yes, the voices are there, but not saying much".  Ideas of Reference:Paranoia  Suicidal Thoughts: Denies.  Homicidal Thoughts: Denies.  Sensorium  Memory:Immediate Fair; Recent Fair; Remote Fair  Judgment:Fair  Insight:Fair  Executive Functions  Concentration:Fair  Attention Span:Fair  Recall:Fair  [employer] of Knowledge:Fair  Language:Fair  Psychomotor Activity  Psychomotor Activity:No data recorded  Assets  Assets:Communication Skills; Desire for Improvement; Housing; Physical Health; Resilience; Social Support  Sleep  Sleep: 7.5 hours (Per documentation).  Physical Exam: Physical Exam Vitals and nursing note  reviewed.  HENT:     Nose: Nose normal.     Mouth/Throat:     Pharynx: Oropharynx is clear.  Eyes:     Pupils: Pupils are equal, round, and reactive to light.  Cardiovascular:     Rate and Rhythm: Normal rate.     Pulses: Normal pulses.  Pulmonary:     Effort: Pulmonary effort is normal.  Genitourinary:    Comments: Deferred Musculoskeletal:        General: Normal range of motion.  Skin:    General: Skin is warm and dry.  Neurological:     General: No focal deficit present.     Mental Status: He is alert and oriented to person, place, and time.   Review of Systems  Constitutional:  Negative for chills, diaphoresis and fever.  HENT:  Negative for congestion and sore throat.   Eyes:  Negative for blurred vision.  Respiratory:  Negative for cough, shortness of breath and wheezing.   Cardiovascular:  Negative for chest pain and palpitations.  Gastrointestinal:  Negative for abdominal pain, constipation, diarrhea, heartburn, nausea and vomiting.  Genitourinary:  Negative for dysuria.  Musculoskeletal:  Negative for falls and myalgias.  Neurological:  Negative for dizziness, tingling, tremors, sensory change, speech change, focal weakness, seizures, loss of consciousness, weakness and headaches.  Endo/Heme/Allergies:        Allergies: Augmentin  Blood pressure 138/81, pulse (!) 104, temperature 97.9 F (36.6 C), temperature source Oral, resp. rate 18, height 6' 1.23" (1.86 m), weight 94.1 kg, SpO2 99 %. Body mass index is 27.19 kg/m.  Treatment Plan Summary: Daily contact with patient to assess and evaluate symptoms and progress in treatment and Medication management.  Continue inpatient hospitalization.  Will continue today 01/14/2022 plan as below except where it is noted.   Treatment Plan/Recommendations: 1. Admit for crisis management and stabilization, estimated length of stay 5-7 days.  Diagnoses: Schizophrenia.   Plan: 2. Medication management to reduce current  symptoms to base line and improve the patient's overall level of functioning: See [facility] for plan of care.  -Continue Zyprexa 10 mg po bid for Schizophrenia.  -Continue Haldol 7.5 mg po bid. -Continue Haldol decanoate 100 mg IM Q 30 days (1st dose administered on 01-13-22). -Continue lorazepam 0.5 mg to tid for anxiety/catatonia. -Continue Fluoxetine 40 mg po daily for depression. -Continue Trazodone 50 mg po Q hs prn for insomnia. -Continue the agitation protocol as recommended prn for agitation/psychosis. -Continue all other prn medications as recommended for anxiety, pain/fever, indigestion. -Continue  Colace 100 mg po bid for constipation.  Encourage group participation. Discharge disposition plan is ongoing.  Lindell Spar, NP, pmhnp, fnp-bc 01/14/2022, 2:20 PM Patient ID: Miguel Shea, male   DOB: [birth year], 27 y.o.   MRN: [id number] Patient ID: Miguel Shea, male   DOB: 11/21/1996, 26 y.o.   MRN: [id number] Patient ID: Miguel Shea, male   DOB: 10/30/1996, 26 y.o.   MRN: [id number] Patient ID: Miguel Shea, male   DOB: December 14, 1996, 26 y.o.   MRN: [id number] Patient ID: Miguel Shea, male   DOB: [birth year], 26 y.o.   MRN: [id number] Patient ID: Miguel Shea, male   DOB: [birth year], 26 y.o.   MRN: [id number]

## 2022-01-14 NOTE — Progress Notes (Signed)
Patient has been in his room resting since shift change and got up only to take his hs medications. Encouraged him to watch tv in dayroom if not wanting to stay in his room all the time but he declined.

## 2022-01-14 NOTE — Progress Notes (Signed)
Patient has been isolative to his room other than to come and get his medication. He reports that he still hears voices but not command hallucinations.

## 2022-01-14 NOTE — Progress Notes (Signed)
Adult Psychoeducational Group Note  Date:  01/14/2022 Time:  9:12 PM  Group Topic/Focus:  Wrap-Up Group:   The focus of this group is to help patients review their daily goal of treatment and discuss progress on daily workbooks.  Participation Level:  Did Not Attend  Participation Quality:  Did Not Attend  Affect:   Did Not Attend  Cognitive:   Did Not Attend  Insight: None  Engagement in Group:  Did Not Attend  Modes of Intervention:   Did Not Attend  Additional Comments:  Pt was encouraged to attend wrap up group but did not attend.  Felipa Furnace 01/14/2022, 9:12 PM

## 2022-01-14 NOTE — Group Note (Signed)
[facility] LCSW Group Therapy Note  Date/Time:  11/04/2022  11:00AM-12:00PM  Type of Therapy and Topic:  Group Therapy:  Music and Mood  Participation Level:  Did Not Attend   Description of Group: In this process group, members listened to a variety of genres of music and identified that different types of music evoke different responses.  Patients were encouraged to identify music that was soothing for them and music that was energizing for them.  Patients discussed how this knowledge can help with wellness and recovery in various ways including managing depression and anxiety as well as encouraging healthy sleep habits.    Therapeutic Goals: Patients will explore the impact of different varieties of music on mood Patients will verbalize the thoughts they have when listening to different types of music Patients will identify music that is soothing to them as well as music that is energizing to them Patients will discuss how to use this knowledge to assist in maintaining wellness and recovery Patients will explore the use of music as a coping skill  Summary of Patient Progress:  Patient was invited to group, did not attend.   Therapeutic Modalities: Solution Focused Brief Therapy Activity   Rosea Dory Grossman-Orr, LCSW   

## 2022-01-14 NOTE — Progress Notes (Signed)
Pt endorsed passive SI without plan when assessed during medication pass. Per pt "I felt suicidal earlier this morning when I woke up. Some days it's hard to get up but I'm fine now". He denies HI, VH and pain. Reports "the voices are improving "They getting better and better". Remains medication compliant with adverse drug reactions. Verbalized interest in discharge planing this shift, requesting to speak with some to assist him access care upon discharge. Tolerates meals and fluids well. Support and reassurance offered. All medications given as ordered. Safety checks maintained at Q 15 minutes intervals. Pt safe on and off milieu without self harm gestures thus far. Continues to be future oriented. Tolerates meals and fluids well. Still isolative in room majority of the day, out for medications and meals only. Denies concerns at this time.

## 2022-01-14 NOTE — Progress Notes (Signed)
Pt was encouraged to go to therapeutic relaxation group but didn't attend. °

## 2022-01-15 MED ORDER — LORAZEPAM 0.5 MG PO TABS: 0.5000 mg | ORAL_TABLET | Freq: Three times a day (TID) | ORAL | Status: DC

## 2022-01-15 MED ORDER — GUANFACINE HCL 1 MG PO TABS
1.0000 mg | ORAL_TABLET | Freq: Every day | ORAL | Status: DC
  Administered 2022-01-15 – 2022-01-17 (×3): 1 mg via ORAL
  Filled 2022-01-15 (×4): qty 1

## 2022-01-15 MED ORDER — LORAZEPAM 0.5 MG PO TABS
0.5000 mg | ORAL_TABLET | Freq: Two times a day (BID) | ORAL | Status: AC
  Administered 2022-01-15 – 2022-01-17 (×4): 0.5 mg via ORAL
  Filled 2022-01-15 (×3): qty 1

## 2022-01-15 MED ORDER — HALOPERIDOL DECANOATE 100 MG/ML IM SOLN
50.0000 mg | Freq: Once | INTRAMUSCULAR | Status: AC
  Administered 2022-01-17: 50 mg via INTRAMUSCULAR
  Filled 2022-01-15: qty 0.5

## 2022-01-15 NOTE — [facility] Group Notes (Signed)
Adult Psychoeducational Group Note  Date:  01/15/2022 Time:  9:08 AM  Group Topic/Focus:  Goals Group:   The focus of this group is to help patients establish daily goals to achieve during treatment and discuss how the patient can incorporate goal setting into their daily lives to aide in recovery.  Participation Level:  Did Not Attend   Miguel Shea 01/15/2022, 9:08 AM

## 2022-01-15 NOTE — Group Note (Signed)
LCSW Group Therapy Note ° ° °Group Date: 01/15/2022 °Start Time: 1300 °End Time: 1400 ° ° ° ° °Participation Level:  Did Not Attend ° ° ° °Caleel Kiner A Olanna Percifield, LCSW °01/15/2022  1:14 PM   °

## 2022-01-15 NOTE — Group Note (Signed)
Recreation Therapy Group Note   Group Topic:Personal Development  Group Date: 01/15/2022 Start Time: 1000 End Time: 1025 Facilitators: Caroll Rancher, LRT,CTRS Location: [address] Dayroom   Goal Area(s) Addresses:   Patient will successfully identify benefit of reflecting on overcoming past challenges. Patient will successfully identify one thing they would do different if given the opportunity. Patient will identify how to take lessons learned and use them post d/c.   Group Description:  Letter To My Younger Self.  Patients were given a blank outline of a mirror.  Patients were to then write a letter/advice to their younger selves.  Patients will express what they would taught themselves that would have had allowed them to make better decisions right now.   Affect/Mood: N/A   Participation Level: Did not attend    Clinical Observations/Individualized Feedback:     Plan: Continue to engage patient in RT group sessions 2-3x/week.   Caroll Rancher, LRT,CTRS 01/15/2022 12:10 PM

## 2022-01-15 NOTE — Progress Notes (Signed)
°   01/15/22 2300  Psych Admission Type (Psych Patients Only)  Admission Status Involuntary  Psychosocial Assessment  Patient Complaints None  Eye Contact Brief  Facial Expression Flat  Affect Appropriate to circumstance  Speech Logical/coherent  Interaction Isolative  Motor Activity Slow  Appearance/Hygiene Disheveled  Behavior Characteristics Cooperative  Mood Suspicious;Preoccupied  Aggressive Behavior  Targets Self  Type of Behavior Provoked or triggered  Effect No apparent injury  Thought Administrator, sports thinking  Content Paranoia  Delusions Paranoid  Perception Hallucinations  Hallucination Auditory ("It's getting better")  Judgment Poor  Confusion None  Danger to Self  Current suicidal ideation? Denies  Self-Injurious Behavior No self-injurious ideation or behavior indicators observed or expressed   Agreement Not to Harm Self Yes  Description of Agreement verbally contracts  Danger to Others  Danger to Others None reported or observed

## 2022-01-15 NOTE — Progress Notes (Signed)
Adult Psychoeducational Group Note  Date:  01/15/2022 Time:  10:27 PM  Group Topic/Focus:  Wrap-Up Group:   The focus of this group is to help patients review their daily goal of treatment and discuss progress on daily workbooks.  Participation Level:  Did Not Attend  Participation Quality:   DNA  Affect:   DNA  Cognitive:   DNA  Insight: Improving  Engagement in Group:  None  Modes of Intervention:  Discussion  Additional Comments:  Pt did not attend group.  Amie Critchley 01/15/2022, 10:27 PM

## 2022-01-15 NOTE — Progress Notes (Addendum)
[facility] MD Progress Note  01/15/2022 2:49 PM Miguel Shea  MRN:  [id number]  Subjective: Miguel Shea reports, "The voices are calming down, but I still have depression and a lot of anxiety".  Daily notes 01-15-22: Chart reviewed and findings discussed with patient's treatment team. For this encounter, pt is seen in his room by this NP and attending Physician. Pt with flat affect and depressed mood, attention to personal hygiene and grooming is fair, eye contact is good, speech is clear & coherent. Thought contents are organized and logical. Pt continues to report +AH, but states that the voices are calmer. He denies VH/HI. Pt however reports feeling anxious and restless, and states that the restlessness is affecting his concentration level. He states that he feels the urge to move frequently, and states he has been on Propranolol in the past, but unsure if it was helpful to him. Pt reports wanting Guanfacine and states that his restlessness is related to his ADHD, and not psychotropic medication related. Will start Guanfacine 1 mg nightly nightly to his medication regimen for restlessness. Pt also states that he does not want to take Ativan for long, and requesting to be tapered off the Ativan. Pt agreeable to four more doses of Ativan, then stopping it. Pt denies having any medication related side effects. Pt to get Haldol 50 mg LAI on Wednesday 01/17/22. His first dose of Haldol Decanoate injection was on 01/13/22, and he received 100 mg then. Pt's last dose of Ativan 0.5 mg will be on 01/17/22 @ 0800.  Pt reports a good sleep quality last night and reports a good appetite. Pt denies SI, and as per nursing reports and documentation, he has mostly been reclusive to his room with limited interactions with staff and peers. Positive reinforcements being given for pt to join his peers in the day room, but he is choosing to remain in his room.   Reason for admission: 26 year old Caucasian male with hx of mental health  issues (Schizophrenia/bipolar disorder). He [facility]-in to the [facility] yesterday with complaint of command auditory hallucinations telling him to do things & other times, the voices do sound repetitive such as think, think that. He reported that his hallucination is more or less a distorted reality of particles that seem different than what they are.  Principal Problem: Schizophrenia, paranoid (HCC)  Diagnosis: Principal Problem:   Schizophrenia, paranoid (HCC)  Total Time spent with patient:  25 minutes  Past Psychiatric History: Schizophrenia.  Past Medical History:  Past Medical History:  Diagnosis Date   Bipolar 1 disorder (HCC)    Wolff-Parkinson-White syndrome    History reviewed. No pertinent surgical history.  Family History: History reviewed. No pertinent family history.  Family Psychiatric  History: See H&P.  Social History:  Social History   Substance and Sexual Activity  Alcohol Use Never     Social History   Substance and Sexual Activity  Drug Use Never    Social History   Socioeconomic History   Marital status: Single    Spouse name: Not on file   Number of children: Not on file   Years of education: Not on file   Highest education level: Not on file  Occupational History   Not on file  Tobacco Use   Smoking status: Never   Smokeless tobacco: Never  Vaping Use   Vaping Use: Some days   Substances: Nicotine, Flavoring  Substance and Sexual Activity   Alcohol use: Never   Drug use: Never  Sexual activity: Not on file  Other Topics Concern   Not on file  Social History Narrative   ** Merged History Encounter **       Social Determinants of Health   Financial Resource Strain: Not on file  Food Insecurity: Not on file  Transportation Needs: Not on file  Physical Activity: Not on file  Stress: Not on file  Social Connections: Not on file   Additional Social History:   Sleep: Good  Appetite:  Good  Current Medications: Current  Facility-Administered Medications  Medication Dose Route Frequency Provider Last Rate Last Admin   acetaminophen (TYLENOL) tablet 650 mg  650 mg Oral Q6H PRN Briant Cedar, MD       alum & mag hydroxide-simeth (MAALOX/MYLANTA) 200-200-20 MG/5ML suspension 30 mL  30 mL Oral Q4H PRN Briant Cedar, MD       docusate sodium (COLACE) capsule 100 mg  100 mg Oral BID Massengill, Ovid Curd, MD   100 mg at 01/15/22 0932   FLUoxetine (PROZAC) capsule 40 mg  40 mg Oral Daily Massengill, Ovid Curd, MD   40 mg at 01/15/22 0931   guanFACINE (TENEX) tablet 1 mg  1 mg Oral QHS Giulian Goldring, NP       haloperidol (HALDOL) tablet 7.5 mg  7.5 mg Oral BID Lindell Spar I, NP   7.5 mg at 01/15/22 0932   haloperidol decanoate (HALDOL DECANOATE) 100 MG/ML injection 100 mg  100 mg Intramuscular Q30 days Bobbitt, Shalon E, NP   100 mg at 01/13/22 0954   [START ON 01/17/2022] haloperidol decanoate (HALDOL DECANOATE) 100 MG/ML injection 50 mg  50 mg Intramuscular Once Nicholes Rough, NP       hydrOXYzine (ATARAX) tablet 25 mg  25 mg Oral TID PRN Briant Cedar, MD   25 mg at 01/09/22 2045   LORazepam (ATIVAN) tablet 0.5 mg  0.5 mg Oral BID Nicholes Rough, NP       OLANZapine zydis (ZYPREXA) disintegrating tablet 5 mg  5 mg Oral Q8H PRN Briant Cedar, MD   5 mg at 01/05/22 [id number]   And   LORazepam (ATIVAN) tablet 1 mg  1 mg Oral PRN Briant Cedar, MD       And   ziprasidone (GEODON) injection 20 mg  20 mg Intramuscular PRN Briant Cedar, MD       magnesium hydroxide (MILK OF MAGNESIA) suspension 30 mL  30 mL Oral Daily PRN Briant Cedar, MD       OLANZapine (ZYPREXA) tablet 10 mg  10 mg Oral BID Massengill, Ovid Curd, MD   10 mg at 01/15/22 0932   traZODone (DESYREL) tablet 50 mg  50 mg Oral QHS PRN Briant Cedar, MD   50 mg at 01/14/22 2056   Lab Results:  No results found for this or any previous visit (from the past 52 hour(s)).  Blood Alcohol level:  Lab Results   Component Value Date   ETH <10 01/02/2022   ETH <10 123XX123   Metabolic Disorder Labs: Lab Results  Component Value Date   HGBA1C 4.6 (L) 01/04/2022   MPG 85.32 01/04/2022   No results found for: PROLACTIN Lab Results  Component Value Date   CHOL 148 01/04/2022   TRIG 104 01/04/2022   HDL 46 01/04/2022   CHOLHDL 3.2 01/04/2022   VLDL 21 01/04/2022   LDLCALC 81 01/04/2022   Physical Findings: AIMS: Facial and Oral Movements Muscles of Facial Expression: None, normal Lips and Perioral Area:  None, normal Jaw: None, normal Tongue: None, normal,Extremity Movements Upper (arms, wrists, hands, fingers): None, normal Lower (legs, knees, ankles, toes): None, normal, Trunk Movements Neck, shoulders, hips: None, normal, Overall Severity Severity of abnormal movements (highest score from questions above): None, normal Incapacitation due to abnormal movements: None, normal Patient's awareness of abnormal movements (rate only patient's report): No Awareness, Dental Status Current problems with teeth and/or dentures?: No Does patient usually wear dentures?: No  CIWA:  n/a COWS: n/a AIMS: 0  Musculoskeletal: Strength & Muscle Tone: within normal limits Gait & Station: normal Patient leans: N/A  Psychiatric Specialty Exam:  Presentation  General Appearance: Appropriate for Environment; Fairly Groomed  Eye Contact:Fair  Speech:Clear and Coherent  Speech Volume:Normal  Handedness:Right   Mood and Affect  Mood:Depressed  Affect:Flat  Thought Process  Thought Processes:Coherent  Descriptions of Associations:Intact  Orientation:Full (Time, Place and Person)  Thought Content:Paranoid Ideation  History of Schizophrenia/Schizoaffective disorder:Yes  Duration of Psychotic Symptoms:Less than six months  Hallucinations: "Yes, the voices are there, but not saying much".  Ideas of Reference:None  Suicidal Thoughts: Denies.  Homicidal Thoughts:  Denies.  Sensorium  Memory:Immediate Good  Judgment:Fair  Insight:Fair  Executive Functions  Concentration:Fair  Attention Span:Fair  [place]  Psychomotor Activity  Psychomotor Activity:Psychomotor Activity: Restlessness   Assets  Assets:Communication Skills  Sleep  Sleep: 7.5 hours (Per documentation).  Physical Exam: Physical Exam Vitals and nursing note reviewed.  HENT:     Nose: Nose normal.     Mouth/Throat:     Pharynx: Oropharynx is clear.  Eyes:     Pupils: Pupils are equal, round, and reactive to light.  Cardiovascular:     Rate and Rhythm: Normal rate.     Pulses: Normal pulses.  Pulmonary:     Effort: Pulmonary effort is normal.  Genitourinary:    Comments: Deferred Musculoskeletal:        General: Normal range of motion.  Skin:    General: Skin is warm and dry.  Neurological:     General: No focal deficit present.     Mental Status: He is alert and oriented to person, place, and time.   Review of Systems  Constitutional:  Negative for chills, diaphoresis and fever.  HENT:  Negative for congestion and sore throat.   Eyes:  Negative for blurred vision.  Respiratory:  Negative for cough, shortness of breath and wheezing.   Cardiovascular:  Negative for chest pain and palpitations.  Gastrointestinal:  Negative for abdominal pain, constipation, diarrhea, heartburn, nausea and vomiting.  Genitourinary:  Negative for dysuria.  Musculoskeletal:  Negative for falls and myalgias.  Neurological:  Negative for dizziness, tingling, tremors, sensory change, speech change, focal weakness, seizures, loss of consciousness, weakness and headaches.  Endo/Heme/Allergies:        Allergies: Augmentin  Blood pressure 114/67, pulse 65, temperature 97.9 F (36.6 C), temperature source Oral, resp. rate 16, height 6' 1.23" (1.86 m), weight 94.1 kg, SpO2 99 %. Body mass index is 27.19 kg/m.  Treatment Plan  Summary: Daily contact with patient to assess and evaluate symptoms and progress in treatment and Medication management.  Continue inpatient hospitalization.  Will continue today 01/15/2022 plan as below except where it is noted.   Treatment Plan/Recommendations: 1. Admit for crisis management and stabilization, estimated length of stay 5-7 days.  Diagnoses: Schizophrenia.   Plan: 2. Medication management to reduce current symptoms to base line and improve the patient's overall level of functioning:   Schizophrenia -  Continue Zyprexa 10 mg po bid  -Continue Haldol 7.5 mg po bid. -Continue Haldol decanoate 100 mg IM Q 30 days (1st dose administered on 01-13-22). -Give Haldol Decanoate LAI 50 mg on 01/17/22 @ 0800 for Schizophrenia -Start Guanfacine 1 mg nightly for ADHD (start on 1//16 @ 2100) fore restlessness/trouble concentrating -Start lorazepam Taper for anxiety/catatonia (will give 0.5 mg BID x 4 doses, with last dose on 01/17/22 @ 0800)  Depression -Continue Fluoxetine 40 mg po daily  Insomnia -Continue Trazodone 50 mg po Q hs.  PRNs -Continue the agitation protocol as recommended prn for agitation/psychosis. -Continue all other prn medications as recommended for anxiety, pain/fever, indigestion. -Continue  Colace 100 mg po bid for constipation.  Encourage group participation. Discharge disposition plan is ongoing.  Nicholes Rough, NP, pmhnp, 01/15/2022, 2:49 PM Patient ID: Miguel Shea, male   DOB: Apr 17, 1996, 26 y.o.   MRN: [id number]

## 2022-01-16 MED ORDER — HALOPERIDOL 5 MG PO TABS
5.0000 mg | ORAL_TABLET | Freq: Two times a day (BID) | ORAL | Status: DC
  Administered 2022-01-17 – 2022-01-18 (×3): 5 mg via ORAL
  Filled 2022-01-16 (×5): qty 1

## 2022-01-16 NOTE — Progress Notes (Signed)
DAR NOTE: Patient presents with a flat affect and depressed mood.  Denies suicidal thoughts and visual hallucination.  Reports voices are quieter today.  Voiced readiness for discharge plan.  Rates depression/hopelessness at 0, and anxiety at 3.  Maintained on routine safety checks.  Medications given as prescribed.  Support and encouragement offered as needed.  Invited and encouraged to attend group but declined.  States goal for today is "discharge."  Patient observed pacing the hallway after returning from the gym.  Patient is safe on and off the unit.

## 2022-01-16 NOTE — Progress Notes (Signed)
[facility] MD Progress Note  01/16/2022 2:48 PM Miguel Shea  MRN:  [id number]  Subjective: Miguel Shea reports, "I'm feeling alright. The voices are quieter today".  Daily notes 01-16-22: Chart reviewed and findings discussed with patient's treatment team. For this encounter, pt is seen in his room on the 500 hall. Pt with flat affect and depressed mood, attention to personal hygiene and grooming is fair, eye contact is good, speech is clear & coherent. Thought contents are organized and logical. Pt continues to report +AH, but states that the voices are quieter today. Pt denies SI/HI/VH, and denies feelings of anxiety. Pt reports that the Guanfacine has helped with his restlessness, and states that he was able to sleep well last night. Pt also reports a good appetite, and denies paranoia or delusional thoughts. HE rates his depression as 6 (10 being worst).  Pt provided with positive verbal reinforcements to  get out of bed and join his peers in the day room for activities, but stated that: "I am just too tired". Pt reported that his morning medications were rendering him tired, and leading to feelings of just wanting to sleep. Pt denies any abnormal movements of any of his extremities, and none observed during this encounter. V/S WNL. Pt denies being in any physical distress. Will decrease Haldol to 5 mg nightly starting tomorrow morning due to tiredness in the mornings.  Reason for admission: 26 year old Caucasian male with hx of mental health issues (Schizophrenia/bipolar disorder). He walked-in to the [facility] yesterday with complaint of command auditory hallucinations telling him to do things & other times, the voices do sound repetitive such as think, think that. He reported that his hallucination is more or less a distorted reality of particles that seem different than what they are.  Principal Problem: Schizophrenia, paranoid ([place])  Diagnosis: Principal Problem:   Schizophrenia, paranoid ([place])  Total  Time spent with patient:  25 minutes  Past Psychiatric History: Schizophrenia.  Past Medical History:  Past Medical History:  Diagnosis Date   Bipolar 1 disorder ([place])    Wolff-Parkinson-White syndrome    History reviewed. No pertinent surgical history.  Family History: History reviewed. No pertinent family history.  Family Psychiatric  History: See H&P.  Social History:  Social History   Substance and Sexual Activity  Alcohol Use Never     Social History   Substance and Sexual Activity  Drug Use Never    Social History   Socioeconomic History   Marital status: Single    Spouse name: Not on file   Number of children: Not on file   Years of education: Not on file   Highest education level: Not on file  Occupational History   Not on file  Tobacco Use   Smoking status: Never   Smokeless tobacco: Never  Vaping Use   Vaping Use: Some days   Substances: Nicotine, Flavoring  Substance and Sexual Activity   Alcohol use: Never   Drug use: Never   Sexual activity: Not on file  Other Topics Concern   Not on file  Social History Narrative   ** Merged History Encounter **       Social Determinants of Health   Financial Resource Strain: Not on file  Food Insecurity: Not on file  Transportation Needs: Not on file  Physical Activity: Not on file  Stress: Not on file  Social Connections: Not on file   Additional Social History:   Sleep: Good  Appetite:  Good  Current Medications: Current  Facility-Administered Medications  Medication Dose Route Frequency Provider Last Rate Last Admin   acetaminophen (TYLENOL) tablet 650 mg  650 mg Oral Q6H PRN Briant Cedar, MD       alum & mag hydroxide-simeth (MAALOX/MYLANTA) 200-200-20 MG/5ML suspension 30 mL  30 mL Oral Q4H PRN Briant Cedar, MD       docusate sodium (COLACE) capsule 100 mg  100 mg Oral BID Massengill, Ovid Curd, MD   100 mg at 01/15/22 0932   FLUoxetine (PROZAC) capsule 40 mg  40 mg Oral Daily  Massengill, Ovid Curd, MD   40 mg at 01/16/22 0816   guanFACINE (TENEX) tablet 1 mg  1 mg Oral QHS Daci Stubbe, NP   1 mg at 01/15/22 2101   [START ON 01/17/2022] haloperidol (HALDOL) tablet 5 mg  5 mg Oral BID Nicholes Rough, NP       Derrill Memo ON 01/17/2022] haloperidol decanoate (HALDOL DECANOATE) 100 MG/ML injection 50 mg  50 mg Intramuscular Once Nicholes Rough, NP       hydrOXYzine (ATARAX) tablet 25 mg  25 mg Oral TID PRN Briant Cedar, MD   25 mg at 01/09/22 2045   LORazepam (ATIVAN) tablet 0.5 mg  0.5 mg Oral BID Nicholes Rough, NP   0.5 mg at 01/16/22 0816   OLANZapine zydis (ZYPREXA) disintegrating tablet 5 mg  5 mg Oral Q8H PRN Briant Cedar, MD   5 mg at 01/05/22 [id number]   And   LORazepam (ATIVAN) tablet 1 mg  1 mg Oral PRN Briant Cedar, MD       And   ziprasidone (GEODON) injection 20 mg  20 mg Intramuscular PRN Briant Cedar, MD       magnesium hydroxide (MILK OF MAGNESIA) suspension 30 mL  30 mL Oral Daily PRN Briant Cedar, MD       OLANZapine (ZYPREXA) tablet 10 mg  10 mg Oral BID Janine Limbo, MD   10 mg at 01/16/22 0819   traZODone (DESYREL) tablet 50 mg  50 mg Oral QHS PRN Briant Cedar, MD   50 mg at 01/14/22 2056   Lab Results:  No results found for this or any previous visit (from the past 70 hour(s)).  Blood Alcohol level:  Lab Results  Component Value Date   ETH <10 01/02/2022   ETH <10 123XX123   Metabolic Disorder Labs: Lab Results  Component Value Date   HGBA1C 4.6 (L) 01/04/2022   MPG 85.32 01/04/2022   No results found for: PROLACTIN Lab Results  Component Value Date   CHOL 148 01/04/2022   TRIG 104 01/04/2022   HDL 46 01/04/2022   CHOLHDL 3.2 01/04/2022   VLDL 21 01/04/2022   LDLCALC 81 01/04/2022   Physical Findings: AIMS: Facial and Oral Movements Muscles of Facial Expression: None, normal Lips and Perioral Area: None, normal Jaw: None, normal Tongue: None, normal,Extremity Movements Upper  (arms, wrists, hands, fingers): None, normal Lower (legs, knees, ankles, toes): None, normal, Trunk Movements Neck, shoulders, hips: None, normal, Overall Severity Severity of abnormal movements (highest score from questions above): None, normal Incapacitation due to abnormal movements: None, normal Patient's awareness of abnormal movements (rate only patient's report): No Awareness, Dental Status Current problems with teeth and/or dentures?: No Does patient usually wear dentures?: No  CIWA:  n/a COWS: n/a AIMS: 0  Musculoskeletal: Strength & Muscle Tone: within normal limits Gait & Station: normal Patient leans: N/A  Psychiatric Specialty Exam:  Presentation  General Appearance: Appropriate for Environment;  Casual; Fairly Groomed  Eye Contact:Good  Speech:Clear and Coherent  Speech Volume:Normal  Handedness:Right  Mood and Affect  Mood:Depressed  Affect:Flat  Thought Process  Thought Processes:Coherent  Descriptions of Associations:Intact  Orientation:Full (Time, Place and Person)  Thought Content:Logical  History of Schizophrenia/Schizoaffective disorder:Yes  Duration of Psychotic Symptoms:Greater than six months  Hallucinations: "Yes, the voices are there, but not saying much".  Ideas of Reference:None  Suicidal Thoughts: Denies.  Homicidal Thoughts: Denies.  Sensorium  Memory:Immediate Good  Judgment:Fair  Insight:Fair  Executive Functions  Concentration:Fair  Attention Span:Fair  [place]  Psychomotor Activity  Psychomotor Activity:Psychomotor Activity: Normal  Assets  Assets:Communication Skills  Sleep  Sleep: 7.5 hours (Per documentation).  Physical Exam: Physical Exam Vitals and nursing note reviewed.  HENT:     Nose: Nose normal.     Mouth/Throat:     Pharynx: Oropharynx is clear.  Eyes:     Pupils: Pupils are equal, round, and reactive to light.  Cardiovascular:     Rate  and Rhythm: Normal rate.     Pulses: Normal pulses.  Pulmonary:     Effort: Pulmonary effort is normal.  Genitourinary:    Comments: Deferred Musculoskeletal:        General: Normal range of motion.  Skin:    General: Skin is warm and dry.  Neurological:     General: No focal deficit present.     Mental Status: He is alert and oriented to person, place, and time.   Review of Systems  Constitutional:  Negative for chills, diaphoresis and fever.  HENT:  Negative for congestion and sore throat.   Eyes:  Negative for blurred vision.  Respiratory:  Negative for cough, shortness of breath and wheezing.   Cardiovascular:  Negative for chest pain and palpitations.  Gastrointestinal:  Negative for abdominal pain, constipation, diarrhea, heartburn, nausea and vomiting.  Genitourinary:  Negative for dysuria.  Musculoskeletal:  Negative for falls and myalgias.  Neurological:  Negative for dizziness, tingling, tremors, sensory change, speech change, focal weakness, seizures, loss of consciousness, weakness and headaches.  Endo/Heme/Allergies:        Allergies: Augmentin  Psychiatric/Behavioral:  Positive for depression and hallucinations. Negative for substance abuse and suicidal ideas. The patient is nervous/anxious. The patient does not have insomnia.   Blood pressure 117/74, pulse 69, temperature 97.9 F (36.6 C), temperature source Oral, resp. rate 16, height 6' 1.23" (1.86 m), weight 94.1 kg, SpO2 99 %. Body mass index is 27.19 kg/m.  Treatment Plan Summary: Daily contact with patient to assess and evaluate symptoms and progress in treatment and Medication management.  Continue inpatient hospitalization.  Will continue today 01/16/2022 plan as below except where it is noted.   Treatment Plan/Recommendations: 1. Admit for crisis management and stabilization, estimated length of stay 5-7 days.  Diagnoses: Schizophrenia.   Plan: 2. Medication management to reduce current symptoms to  base line and improve the patient's overall level of functioning:   Schizophrenia -Continue Zyprexa 10 mg po bid  -Decrease Haldol to 5 mg po bid (starting  01/17/22) -Continue Haldol decanoate 100 mg IM Q 30 days (1st dose administered on 01-13-22). -Give Haldol Decanoate LAI 50 mg on 01/17/22 @ 0800 for Schizophrenia -Continue Guanfacine 1 mg nightly for ADHD (started on 1//16 @ 2100) fore restlessness/trouble concentrating -Continue lorazepam Taper for anxiety/catatonia (will give 0.5 mg BID x 4 doses, with last dose on 01/17/22 @ 0800)  Depression -Continue Fluoxetine 40 mg po daily  Insomnia -  Continue Trazodone 50 mg po Q hs.  PRNs -Continue the agitation protocol as recommended prn for agitation/psychosis. -Continue all other prn medications as recommended for anxiety, pain/fever, indigestion. -Continue  Colace 100 mg po bid for constipation.  Encourage group participation. Discharge disposition plan is ongoing.  Nicholes Rough, NP, pmhnp 01/16/2022, 2:48 PM Patient ID: Miguel Shea, male   DOB: 26-Apr-1996, 26 y.o.   MRN: [id number]

## 2022-01-16 NOTE — Group Note (Signed)
Recreation Therapy Group Note   Group Topic:Goal Setting  Group Date: 01/16/2022 Start Time: 0955 End Time: 1045 Facilitators: Caroll Rancher, LRT,CTRS Location: 500 Hall Dayroom   Goal Area(s) Addresses:  Patient will participate in discussion of what a goal is. Patient will successfully identify a goal for various time frames. Patient will identify the importance of goals post d/c.  Group Description: Group started with a discussion about group rules. Patients had a group conversation on SMART goals, and what the acronym stands for; specific, measurable, attainable, relevant, and time-bound. Patients were given a worksheet that gave four time frames (week, month, year and 5 years) to complete goals.  Patients then identified obstacles to prevent reaching goals, what's needed to achieve goals and what can be done now to start working towards goals.   Affect/Mood: N/A   Participation Level: Did not attend    Clinical Observations/Individualized Feedback:     Plan: Continue to engage patient in RT group sessions 2-3x/week.   Caroll Rancher, LRT,CTRS 01/16/2022 11:50 AM

## 2022-01-16 NOTE — Progress Notes (Signed)
°   01/16/22 2000  Psych Admission Type (Psych Patients Only)  Admission Status Involuntary  Psychosocial Assessment  Patient Complaints None  Eye Contact Brief  Facial Expression Flat  Affect Appropriate to circumstance  Speech Logical/coherent  Interaction Isolative  Motor Activity Slow  Appearance/Hygiene Disheveled  Behavior Characteristics Cooperative  Mood Suspicious;Preoccupied  Aggressive Behavior  Targets Self  Type of Behavior Provoked or triggered  Effect No apparent injury  Thought Pension scheme manager thinking  Content Paranoia  Delusions Paranoid  Perception Hallucinations  Hallucination Auditory ("It's getting better")  Judgment Poor  Confusion None  Danger to Self  Current suicidal ideation? Denies  Self-Injurious Behavior No self-injurious ideation or behavior indicators observed or expressed   Agreement Not to Harm Self Yes  Description of Agreement verbally contracts  Danger to Others  Danger to Others None reported or observed

## 2022-01-16 NOTE — Progress Notes (Signed)
Pt was encouraged but didn't attend orientation/goals group. ?

## 2022-01-16 NOTE — Progress Notes (Signed)
°   01/16/22 0530  Sleep  Number of Hours 7.25

## 2022-01-17 ENCOUNTER — Encounter (HOSPITAL_COMMUNITY): Payer: Self-pay

## 2022-01-17 NOTE — [facility] Group Notes (Signed)
1400 Relaxation group.  °A poem was read ''The Owl and the Chimpanzee'' in which patients were asked to identify times in which they over reacted to stress. The patients then participated in deep breathing exercises and positive affirmations to support relaxation.  Pt did not attend.  °

## 2022-01-17 NOTE — Progress Notes (Signed)
Patient ID: Miguel Shea, male   DOB: 10/20/1996, 26 y.o.   MRN: [id number] [facility] MD Progress Note  01/17/2022 9:47 AM Miguel Shea  MRN:  [id number]  Subjective: Miguel Shea reports, "I'm alright. I think I'm going home tomorrow. I just need the times for my monthly shots planned well for me. The voices are quiet, but still there. They are not bothering me".  Daily notes 01-17-22: Miguel Shea is seen. chart reviewed. The chart findings discussed with patient's treatment team. For this encounter, pt is seen in his room on the 500 hall. Prior to that, he was observed walking up & down the hall-way. He presents with restricted/somewhat reactive affect. He is doing better in maintaining his personal hygiene. He is making a fair eye contact, speech is clear & coherent. Thought contents are organized and logical. Pt continues to report +AH, but states that the voices are quiet & they are not bothering him today. Pt denies SI/HI/VH, and denies feelings of anxiety. Pt reports that he is doing well on his medications. Denies any side effects. He is asking if he is going to be discharged tomorrow. Instructed patient that he is suppose to meet with the Act Team staff today, that we will know more about his discharge after the meeting. Miguel Shea has asked that he would want for the times for his monthly haldol shots to be arranged for him so he would not miss getting it. He says he slept well last night. Pt also reports a good appetite, and denies paranoia/delusional thoughts. He rates his depression #4 (10 being worst & 1 being the least).  Reason for admission: 26 year old Caucasian male with hx of mental health issues (Schizophrenia/bipolar disorder). He walked-in to the [facility] yesterday with complaint of command auditory hallucinations telling him to do things & other times, the voices do sound repetitive such as think, think that. He reported that his hallucination is more or less a distorted reality of particles that seem  different than what they are.  Principal Problem: Schizophrenia, paranoid ([place])  Diagnosis: Principal Problem:   Schizophrenia, paranoid ([place])  Total Time spent with patient:  25 minutes  Past Psychiatric History: Schizophrenia.  Past Medical History:  Past Medical History:  Diagnosis Date   Bipolar 1 disorder ([place])    Wolff-Parkinson-White syndrome    History reviewed. No pertinent surgical history.  Family History: History reviewed. No pertinent family history.  Family Psychiatric  History: See H&P.  Social History:  Social History   Substance and Sexual Activity  Alcohol Use Never     Social History   Substance and Sexual Activity  Drug Use Never    Social History   Socioeconomic History   Marital status: Single    Spouse name: Not on file   Number of children: Not on file   Years of education: Not on file   Highest education level: Not on file  Occupational History   Not on file  Tobacco Use   Smoking status: Never   Smokeless tobacco: Never  Vaping Use   Vaping Use: Some days   Substances: Nicotine, Flavoring  Substance and Sexual Activity   Alcohol use: Never   Drug use: Never   Sexual activity: Not on file  Other Topics Concern   Not on file  Social History Narrative   ** Merged History Encounter **       Social Determinants of Health   Financial Resource Strain: Not on file  Food Insecurity: Not on file  Transportation Needs: Not on file  Physical Activity: Not on file  Stress: Not on file  Social Connections: Not on file   Additional Social History:   Sleep: Good  Appetite:  Good  Current Medications: Current Facility-Administered Medications  Medication Dose Route Frequency Provider Last Rate Last Admin   acetaminophen (TYLENOL) tablet 650 mg  650 mg Oral Q6H PRN Briant Cedar, MD       alum & mag hydroxide-simeth (MAALOX/MYLANTA) 200-200-20 MG/5ML suspension 30 mL  30 mL Oral Q4H PRN Briant Cedar, MD        docusate sodium (COLACE) capsule 100 mg  100 mg Oral BID Massengill, Ovid Curd, MD   100 mg at 01/15/22 0932   FLUoxetine (PROZAC) capsule 40 mg  40 mg Oral Daily Massengill, Ovid Curd, MD   40 mg at 01/16/22 0816   guanFACINE (TENEX) tablet 1 mg  1 mg Oral QHS Nkwenti, Doris, NP   1 mg at 01/16/22 2056   haloperidol (HALDOL) tablet 5 mg  5 mg Oral BID Nicholes Rough, NP       haloperidol decanoate (HALDOL DECANOATE) 100 MG/ML injection 50 mg  50 mg Intramuscular Once Nicholes Rough, NP       hydrOXYzine (ATARAX) tablet 25 mg  25 mg Oral TID PRN Briant Cedar, MD   25 mg at 01/16/22 2057   LORazepam (ATIVAN) tablet 0.5 mg  0.5 mg Oral BID Nicholes Rough, NP   0.5 mg at 01/16/22 1734   OLANZapine zydis (ZYPREXA) disintegrating tablet 5 mg  5 mg Oral Q8H PRN Briant Cedar, MD   5 mg at 01/05/22 [id number]   And   LORazepam (ATIVAN) tablet 1 mg  1 mg Oral PRN Briant Cedar, MD       And   ziprasidone (GEODON) injection 20 mg  20 mg Intramuscular PRN Briant Cedar, MD       magnesium hydroxide (MILK OF MAGNESIA) suspension 30 mL  30 mL Oral Daily PRN Briant Cedar, MD       OLANZapine (ZYPREXA) tablet 10 mg  10 mg Oral BID Massengill, Ovid Curd, MD   10 mg at 01/16/22 2056   traZODone (DESYREL) tablet 50 mg  50 mg Oral QHS PRN Briant Cedar, MD   50 mg at 01/14/22 2056   Lab Results:  No results found for this or any previous visit (from the past 51 hour(s)).  Blood Alcohol level:  Lab Results  Component Value Date   ETH <10 01/02/2022   ETH <10 123XX123   Metabolic Disorder Labs: Lab Results  Component Value Date   HGBA1C 4.6 (L) 01/04/2022   MPG 85.32 01/04/2022   No results found for: PROLACTIN Lab Results  Component Value Date   CHOL 148 01/04/2022   TRIG 104 01/04/2022   HDL 46 01/04/2022   CHOLHDL 3.2 01/04/2022   VLDL 21 01/04/2022   LDLCALC 81 01/04/2022   Physical Findings: AIMS: Facial and Oral Movements Muscles of Facial Expression:  None, normal Lips and Perioral Area: None, normal Jaw: None, normal Tongue: None, normal,Extremity Movements Upper (arms, wrists, hands, fingers): None, normal Lower (legs, knees, ankles, toes): None, normal, Trunk Movements Neck, shoulders, hips: None, normal, Overall Severity Severity of abnormal movements (highest score from questions above): None, normal Incapacitation due to abnormal movements: None, normal Patient's awareness of abnormal movements (rate only patient's report): No Awareness, Dental Status Current problems with teeth and/or dentures?: No Does patient usually wear dentures?: No  CIWA:  n/a COWS: n/a AIMS: 0  Musculoskeletal: Strength & Muscle Tone: within normal limits Gait & Station: normal Patient leans: N/A  Psychiatric Specialty Exam:  Presentation  General Appearance: Appropriate for Environment; Casual; Fairly Groomed  Eye Contact:Good  Speech:Clear and Coherent  Speech Volume:Normal  Handedness:Right  Mood and Affect  Mood:Depressed  Affect:Flat  Thought Process  Thought Processes:Coherent  Descriptions of Associations:Intact  Orientation:Full (Time, Place and Person)  Thought Content:Logical  History of Schizophrenia/Schizoaffective disorder:Yes  Duration of Psychotic Symptoms:Greater than six months  Hallucinations: "Yes, the voices are there, but not saying much".  Ideas of Reference:None  Suicidal Thoughts: Denies.  Homicidal Thoughts: Denies.  Sensorium  Memory:Immediate Good  Judgment:Fair  Insight:Fair  Executive Functions  Concentration:Fair  Attention Span:Fair  [place]  Psychomotor Activity  Psychomotor Activity:Psychomotor Activity: Normal  Assets  Assets:Communication Skills  Sleep  Sleep: 10.5 hours (Per documentation).  Physical Exam: Physical Exam Vitals and nursing note reviewed.  HENT:     Nose: Nose normal.     Mouth/Throat:     Pharynx:  Oropharynx is clear.  Eyes:     Pupils: Pupils are equal, round, and reactive to light.  Cardiovascular:     Rate and Rhythm: Normal rate.     Pulses: Normal pulses.  Pulmonary:     Effort: Pulmonary effort is normal.  Genitourinary:    Comments: Deferred Musculoskeletal:        General: Normal range of motion.  Skin:    General: Skin is warm and dry.  Neurological:     General: No focal deficit present.     Mental Status: He is alert and oriented to person, place, and time.   Review of Systems  Constitutional:  Negative for chills, diaphoresis and fever.  HENT:  Negative for congestion and sore throat.   Eyes:  Negative for blurred vision.  Respiratory:  Negative for cough, shortness of breath and wheezing.   Cardiovascular:  Negative for chest pain and palpitations.  Gastrointestinal:  Negative for abdominal pain, constipation, diarrhea, heartburn, nausea and vomiting.  Genitourinary:  Negative for dysuria.  Musculoskeletal:  Negative for falls and myalgias.  Neurological:  Negative for dizziness, tingling, tremors, sensory change, speech change, focal weakness, seizures, loss of consciousness, weakness and headaches.  Endo/Heme/Allergies:        Allergies: Augmentin  Psychiatric/Behavioral:  Positive for depression and hallucinations. Negative for substance abuse and suicidal ideas. The patient is nervous/anxious. The patient does not have insomnia.   Blood pressure 120/71, pulse 85, temperature 98.1 F (36.7 C), temperature source Oral, resp. rate 16, height 6' 1.23" (1.86 m), weight 94.1 kg, SpO2 99 %. Body mass index is 27.19 kg/m.  Treatment Plan Summary: Daily contact with patient to assess and evaluate symptoms and progress in treatment and Medication management.  Continue inpatient hospitalization.  Will continue today 01/17/2022 plan as below except where it is noted.   Treatment Plan/Recommendations: 1. Admit for crisis management and stabilization, estimated  length of stay 5-7 days.  Diagnoses: Schizophrenia.   Plan: 2. Medication management to reduce current symptoms to base line and improve the patient's overall level of functioning:   Schizophrenia -Continue Zyprexa 10 mg po bid  -Continue Haldol 5 mg po bid. -Continue Haldol decanoate 100 mg IM Q 30 days (1st dose administered on 01-13-22). -Give Haldol Decanoate LAI 50 mg on 01/17/22 @ 0800 for Schizophrenia (completed). -Continue Guanfacine 1 mg nightly for ADHD for (restlessness/trouble concentration). -Continue lorazepam Taper for anxiety/catatonia (  will give 0.5 mg BID x 4 doses, with last dose on 01/17/22 @ 0800) (completed).  Depression -Continue Fluoxetine 40 mg po daily  Insomnia -Continue Trazodone 50 mg po Q hs.  PRNs -Continue the agitation protocol as recommended prn for agitation/psychosis. -Continue all other prn medications as recommended for anxiety, pain/fever, indigestion. -Continue  Colace 100 mg po bid for constipation.  Encourage group participation. Discharge disposition plan is ongoing.  Lindell Spar, NP, pmhnp, fnp-bc 01/17/2022, 9:47 AM Patient ID: Wenda Low, male   DOB: 09-03-1996, 26 y.o.   MRN: [id number]

## 2022-01-17 NOTE — Progress Notes (Signed)
°   01/17/22 0600  Sleep  Number of Hours 10.5

## 2022-01-17 NOTE — Progress Notes (Signed)
Pt calm and cooperative this shift.  Pt went to dinner with encouragement from RN.  Pt has spent most of the day in his room but was observed walking up and down the hallways at times.  Pt said voices are "a lot better, I'm ready for discharge."   01/17/22 1000  Psych Admission Type (Psych Patients Only)  Admission Status Involuntary  Psychosocial Assessment  Patient Complaints None  Eye Contact Brief  Facial Expression Flat  Affect Appropriate to circumstance  Speech Logical/coherent  Interaction Isolative  Motor Activity Slow  Appearance/Hygiene Unremarkable  Behavior Characteristics Cooperative;Appropriate to situation  Mood Anxious  Thought Process  Coherency Concrete thinking  Content Paranoia  Delusions Paranoid  Perception Hallucinations  Hallucination Auditory (quieting down)  Judgment Poor  Confusion None  Danger to Self  Current suicidal ideation? Denies  Self-Injurious Behavior No self-injurious ideation or behavior indicators observed or expressed   Agreement Not to Harm Self Yes  Description of Agreement verbally contracts  Danger to Others  Danger to Others None reported or observed

## 2022-01-17 NOTE — [facility] Group Notes (Signed)
The focus of this group is to help patients review their daily goal of treatment and discuss progress on daily workbooks.Pt didn't attend wrap up group.  

## 2022-01-17 NOTE — Progress Notes (Signed)
Pt continued to keep to himself much of the evening , pt continued to appear paranoid, but seemed to feel better , pt continues to forward little     01/17/22 2100  Psych Admission Type (Psych Patients Only)  Admission Status Involuntary  Psychosocial Assessment  Patient Complaints None  Eye Contact Brief  Facial Expression Flat  Affect Appropriate to circumstance  Speech Logical/coherent  Interaction Isolative  Motor Activity Slow  Appearance/Hygiene Unremarkable  Behavior Characteristics Cooperative  Mood Anxious  Thought Process  Coherency Concrete thinking  Content Paranoia  Delusions Paranoid  Perception Hallucinations  Hallucination Auditory (quieting down)  Judgment Poor  Confusion None  Danger to Self  Current suicidal ideation? Denies  Self-Injurious Behavior No self-injurious ideation or behavior indicators observed or expressed   Agreement Not to Harm Self Yes  Description of Agreement verbally contracts  Danger to Others  Danger to Others None reported or observed

## 2022-01-17 NOTE — BH IP Treatment Plan (Signed)
Interdisciplinary Treatment and Diagnostic Plan Update  01/17/2022 Time of Session: 10:25am Dangelo Mcniece MRN: [id number]  Principal Diagnosis: Schizophrenia, paranoid ([place])  Secondary Diagnoses: Principal Problem:   Schizophrenia, paranoid ([place])   Current Medications:  Current Facility-Administered Medications  Medication Dose Route Frequency Provider Last Rate Last Admin   acetaminophen (TYLENOL) tablet 650 mg  650 mg Oral Q6H PRN Briant Cedar, MD       alum & mag hydroxide-simeth (MAALOX/MYLANTA) 200-200-20 MG/5ML suspension 30 mL  30 mL Oral Q4H PRN Briant Cedar, MD       docusate sodium (COLACE) capsule 100 mg  100 mg Oral BID Massengill, Ovid Curd, MD   100 mg at 01/17/22 0951   FLUoxetine (PROZAC) capsule 40 mg  40 mg Oral Daily Massengill, Ovid Curd, MD   40 mg at 01/17/22 0951   guanFACINE (TENEX) tablet 1 mg  1 mg Oral QHS Nkwenti, Doris, NP   1 mg at 01/16/22 2056   haloperidol (HALDOL) tablet 5 mg  5 mg Oral BID Nicholes Rough, NP   5 mg at 01/17/22 0951   hydrOXYzine (ATARAX) tablet 25 mg  25 mg Oral TID PRN Briant Cedar, MD   25 mg at 01/16/22 2057   OLANZapine zydis (ZYPREXA) disintegrating tablet 5 mg  5 mg Oral Q8H PRN Briant Cedar, MD   5 mg at 01/05/22 [id number]   And   LORazepam (ATIVAN) tablet 1 mg  1 mg Oral PRN Briant Cedar, MD       And   ziprasidone (GEODON) injection 20 mg  20 mg Intramuscular PRN Briant Cedar, MD       magnesium hydroxide (MILK OF MAGNESIA) suspension 30 mL  30 mL Oral Daily PRN Briant Cedar, MD       OLANZapine (ZYPREXA) tablet 10 mg  10 mg Oral BID Janine Limbo, MD   10 mg at 01/17/22 0950   traZODone (DESYREL) tablet 50 mg  50 mg Oral QHS PRN Briant Cedar, MD   50 mg at 01/14/22 2056   PTA Medications: No medications prior to admission.    Patient Stressors: Marital or family conflict   Medication change or noncompliance    Patient Strengths: Motivation for  treatment/growth  Supportive family/friends   Treatment Modalities: Medication Management, Group therapy, Case management,  1 to 1 session with clinician, Psychoeducation, Recreational therapy.   Physician Treatment Plan for Primary Diagnosis: Schizophrenia, paranoid ([place]) Long Term Goal(s): Improvement in symptoms so as ready for discharge   Short Term Goals: Ability to identify and develop effective coping behaviors will improve Ability to maintain clinical measurements within normal limits will improve Compliance with prescribed medications will improve Ability to identify changes in lifestyle to reduce recurrence of condition will improve Ability to verbalize feelings will improve Ability to disclose and discuss suicidal ideas Ability to demonstrate self-control will improve  Medication Management: Evaluate patient's response, side effects, and tolerance of medication regimen.  Therapeutic Interventions: 1 to 1 sessions, Unit Group sessions and Medication administration.  Evaluation of Outcomes: Progressing  Physician Treatment Plan for Secondary Diagnosis: Principal Problem:   Schizophrenia, paranoid ([place])  Long Term Goal(s): Improvement in symptoms so as ready for discharge   Short Term Goals: Ability to identify and develop effective coping behaviors will improve Ability to maintain clinical measurements within normal limits will improve Compliance with prescribed medications will improve Ability to identify changes in lifestyle to reduce recurrence of condition will improve Ability to verbalize feelings will improve  Ability to disclose and discuss suicidal ideas Ability to demonstrate self-control will improve     Medication Management: Evaluate patient's response, side effects, and tolerance of medication regimen.  Therapeutic Interventions: 1 to 1 sessions, Unit Group sessions and Medication administration.  Evaluation of Outcomes: Progressing   RN Treatment Plan  for Primary Diagnosis: Schizophrenia, paranoid ([place]) Long Term Goal(s): Knowledge of disease and therapeutic regimen to maintain health will improve  Short Term Goals: Ability to remain free from injury will improve, Ability to verbalize frustration and anger appropriately will improve, Ability to identify and develop effective coping behaviors will improve, and Compliance with prescribed medications will improve  Medication Management: RN will administer medications as ordered by provider, will assess and evaluate patient's response and provide education to patient for prescribed medication. RN will report any adverse and/or side effects to prescribing provider.  Therapeutic Interventions: 1 on 1 counseling sessions, Psychoeducation, Medication administration, Evaluate responses to treatment, Monitor vital signs and CBGs as ordered, Perform/monitor CIWA, COWS, AIMS and Fall Risk screenings as ordered, Perform wound care treatments as ordered.  Evaluation of Outcomes: Progressing   LCSW Treatment Plan for Primary Diagnosis: Schizophrenia, paranoid ([place]) Long Term Goal(s): Safe transition to appropriate next level of care at discharge, Engage patient in therapeutic group addressing interpersonal concerns.  Short Term Goals: Engage patient in aftercare planning with referrals and resources, Increase social support, Identify triggers associated with mental health/substance abuse issues, and Increase skills for wellness and recovery  Therapeutic Interventions: Assess for all discharge needs, 1 to 1 time with Social worker, Explore available resources and support systems, Assess for adequacy in community support network, Educate family and significant other(s) on suicide prevention, Complete Psychosocial Assessment, Interpersonal group therapy.  Evaluation of Outcomes: Progressing   Progress in Treatment: Attending groups: No. Participating in groups: No. Taking medication as prescribed:  Yes. Toleration medication: Yes. Family/Significant other contact made: Yes, individual(s) contacted:  mom Patient understands diagnosis: No. Discussing patient identified problems/goals with staff: Yes. Medical problems stabilized or resolved: Yes. Denies suicidal/homicidal ideation: Yes. Issues/concerns per patient self-inventory: No. Other: None   New problem(s) identified: No, Describe:  None   New Short Term/Long Term Goal(s):medication stabilization, elimination of SI thoughts, development of comprehensive mental wellness plan.    Patient Goals:  "To get on medications"   Discharge Plan or Barriers: Pt is to follow up with [facility] at discharge   Reason for Continuation of Hospitalization:  Hallucinations Medication stabilization   Estimated Length of Stay: 1-3 days   Scribe for Treatment Team: Vassie Moselle, LCSW 01/17/2022 10:41 AM

## 2022-01-17 NOTE — Group Note (Signed)
Recreation Therapy Group Note   Group Topic:Team Building  Group Date: 01/17/2022 Start Time: 1005 End Time: 1035 Facilitators: Caroll Rancher, LRT,CTRS Location: [address] Dayroom   Goal Area(s) Addresses:  Patient will effectively work with peer towards shared goal.  Patient will identify skills used to make activity successful.  Patient will identify how skills used during activity can be applied to reach post d/c goals.   Group Description: [employer]. In teams of 5-6, patients were given 15 craft pipe cleaners. Using the materials provided, patients were instructed to compete againt the opposing team(s) to build the tallest free-standing structure from floor level. The activity was timed; difficulty increased by Clinical research associate as Production designer, theatre/television/film continued.  Systematically resources were removed with additional directions for example, placing one arm behind their back, working in silence, and shape stipulations. LRT facilitated post-activity discussion reviewing team processes and necessary communication skills involved in completion. Patients were encouraged to reflect how the skills utilized, or not utilized, in this activity can be incorporated to positively impact support systems post discharge.   Affect/Mood: N/A   Participation Level: Did not attend    Clinical Observations/Individualized Feedback:     Plan: Continue to engage patient in RT group sessions 2-3x/week.   Caroll Rancher, LRT,CTRS 01/17/2022 12:10 PM

## 2022-01-17 NOTE — [facility] Group Notes (Signed)
Adult Psychoeducational Group Note  Date:  01/17/2022 Time:  10:06 AM  Group Topic/Focus:  Goals Group:   The focus of this group is to help patients establish daily goals to achieve during treatment and discuss how the patient can incorporate goal setting into their daily lives to aide in recovery.  Participation Level:  Did Not Attend    Miguel Shea 01/17/2022, 10:06 AM

## 2022-01-17 NOTE — Group Note (Signed)
LCSW Group Therapy Note ° °Group Date: 01/17/2022 °Start Time: 1300 °End Time: 1400 ° ° °Type of Therapy and Topic:  Group Therapy: Positive Affirmations ° °Participation Level:  Did Not Attend ° ° °Description of Group:   °This group addressed positive affirmation towards self and others.  Patients went around the room and identified two positive things about themselves and two positive things about a peer in the room.  Patients reflected on how it felt to share something positive with others, to identify positive things about themselves, and to hear positive things from others/ Patients were encouraged to have a daily reflection of positive characteristics or circumstances.  ° °Therapeutic Goals: °Patients will verbalize two of their positive qualities °Patients will demonstrate empathy for others by stating two positive qualities about a peer in the group °Patients will verbalize their feelings when voicing positive self affirmations and when voicing positive affirmations of others °Patients will discuss the potential positive impact on their wellness/recovery of focusing on positive traits of self and others. ° °Summary of Patient Progress:  Did not attend ° °Therapeutic Modalities:   °Cognitive Behavioral Therapy °Motivational Interviewing ° ° ° °Hailee Hollick A Jeralynn Vaquera, LCSW °01/17/2022  1:46 PM   ° °

## 2022-01-17 NOTE — Progress Notes (Signed)
Adult Psychoeducational Group Note  Date:  01/17/2022 Time:  1:39 AM  Group Topic/Focus:  Wrap-Up Group:   The focus of this group is to help patients review their daily goal of treatment and discuss progress on daily workbooks.  Participation Level:  Did Not Attend  Participation Quality:   DNA  Affect:   DNA  Cognitive:   DNA  Insight: Improving  Engagement in Group:  None  Modes of Intervention:  Discussion  Additional Comments:  Pt did not attend group.  Osa Craver 01/17/2022, 1:39 AM

## 2022-01-18 MED ORDER — HYDROXYZINE HCL 25 MG PO TABS: 25.0000 mg | ORAL_TABLET | Freq: Three times a day (TID) | ORAL | 0 refills | Status: DC | PRN

## 2022-01-18 MED ORDER — HALOPERIDOL DECANOATE 100 MG/ML IM SOLN: 100.0000 mg | INTRAMUSCULAR | Status: DC

## 2022-01-18 MED ORDER — FLUOXETINE HCL 40 MG PO CAPS: 40.0000 mg | ORAL_CAPSULE | Freq: Every day | ORAL | 0 refills | Status: DC

## 2022-01-18 MED ORDER — TRAZODONE HCL 50 MG PO TABS: 50.0000 mg | ORAL_TABLET | Freq: Every evening | ORAL | 0 refills | Status: DC | PRN

## 2022-01-18 MED ORDER — GUANFACINE HCL 1 MG PO TABS
1.0000 mg | ORAL_TABLET | Freq: Every day | ORAL | 0 refills | Status: DC
Start: 2022-01-18 — End: 2023-02-09

## 2022-01-18 MED ORDER — OLANZAPINE 10 MG PO TABS: 10.0000 mg | ORAL_TABLET | Freq: Two times a day (BID) | ORAL | 0 refills | Status: DC

## 2022-01-18 MED ORDER — HALOPERIDOL 5 MG PO TABS: 5.0000 mg | ORAL_TABLET | Freq: Two times a day (BID) | ORAL | 0 refills | Status: DC

## 2022-01-18 MED ORDER — HALOPERIDOL DECANOATE 100 MG/ML IM SOLN: 100.0000 mg | INTRAMUSCULAR | 0 refills | Status: DC

## 2022-01-18 NOTE — Discharge Summary (Signed)
Physician Discharge Summary Note  Patient:  Miguel Shea is an 26 y.o., male  MRN:  [id number]  DOB:  1996/05/14  Patient phone:  [phone] (home)   Patient address:   [address] [place] [address],   Total Time spent with patient:  Greater than 30 minutes  Date of Admission:  01/02/2022 Date of Discharge: 01-18-22  Reason for Admission: Worsening command auditory hallucinations.  Principal Problem: Schizophrenia, paranoid [facility])  Discharge Diagnoses: Principal Problem:   Schizophrenia, paranoid (HCC)  Past Psychiatric History: Schizophrenia.  Past Medical History:  Past Medical History:  Diagnosis Date   Bipolar 1 disorder (HCC)    Wolff-Parkinson-White syndrome    History reviewed. No pertinent surgical history.  Family History: History reviewed. No pertinent family history.  Family Psychiatric  History: See H&P  Social History:  Social History   Substance and Sexual Activity  Alcohol Use Never     Social History   Substance and Sexual Activity  Drug Use Never    Social History   Socioeconomic History   Marital status: Single    Spouse name: Not on file   Number of children: Not on file   Years of education: Not on file   Highest education level: Not on file  Occupational History   Not on file  Tobacco Use   Smoking status: Never   Smokeless tobacco: Never  Vaping Use   Vaping Use: Some days   Substances: Nicotine, Flavoring  Substance and Sexual Activity   Alcohol use: Never   Drug use: Never   Sexual activity: Not on file  Other Topics Concern   Not on file  Social History Narrative   ** Merged History Encounter **       Social Determinants of Health   Financial Resource Strain: Not on file  Food Insecurity: Not on file  Transportation Needs: Not on file  Physical Activity: Not on file  Stress: Not on file  Social Connections: Not on file   Hospital Course: (Per Md's admission evaluation notes): 26 year old  Caucasian male with hx of mental health issues (Schizophrenia/bipolar disorder). He walked-in to the [facility] yesterday with complaint of command auditory hallucinations telling him to do things & other times, the voices do sound repetitive such as think, think that. He reported that his hallucination is more or less a distorted reality of particles that seem different than what they are. After medical screening by one of the [facility] resident doctors, he was recommended for inpatient admission for mood stabilization treatments. During this evaluation, Miguel Shea reports,  "I'm here due to Schizophrenia. I have had it for over a year. I have a lot of paranoia. People make me uncomfortable. I was on medication for my Schizophrenia, but stopped, have not taken in any medications for at least one year. I stopped taking medications because I did not believe that I have Schizophrenia. I had a lot in my mind the time I was diagnosed with Schizophrenia.That was the reason that I did not believe that I have Schizophrenia. But, I would like to take medicines while I'm here. I know Zyprexa did help me in the past. My mood is currently bad. I used to punch the wall when mad. That is how I got the scar to my right fore-arm. The last time I punched the wall was 6 months ago. No, I'm not depressed or anxious. I'm not feeling suicidal or homicidal. I sleep well at night".   Upon the decision to  discharge Miguel Shea today, he was seen & evaluated by his treatment team for mental health stability. The current laboratory findings were reviewed. The nurses notes & vital signs were reviewed as well. All are stable. At this present time, there are no current mental health or medical issues that should prevent this discharge at this time. Patient is being discharged to continue mental health care & medication management as noted below.   Although with what seem like an extensive hx of mental health issues & other possible mental health treatments  both (inpatient & on an outpatient basis), this is Miguel Shea's first psychiatric admission/discharge summary from this [facility]. He was admitted with complaint of worsening command hallucinations, worsening depression & was recommended for mood stabilization treatments by his treatment team after his admission evaluation. And with his consent, he received, stabilized & was discharged on the medications as listed below on his discharge medication lists. He was also enrolled & seldomly participated in the group counseling sessions being offered & held on this unit to learn coping skills. Miguel Shea spent most of his time by himself in his room. He did come out of his room during meal times & medication administrations. Other times, he was seen walking up & down the unit hall-way. Although kept to himself most of his time here, Miguel Shea displayed no behavioral issues that required restraints or acute medication management. He did take his medications as recommended. He presented no other significant pre-existing medical conditions that required treatment or monitoring. He tolerated his treatment regimen without any adverse effects or reactions reported.  And because Miguel Shea was unable to achieve symptoms control under an antipsychotic monotherapy, he is currently receiving & being discharged on 2 separate antipsychotic medications Olanzapine & monthly Haldol Injectable/haldol tablets which seem effective in controlling/stabilizing his symptoms warranting this discharge at this time. It will benefit patient to continue on these combination antipsychotic therapies as recommended. However, as his symptoms continue to improve, patient may be then titrated down to an antipsychotic monotherapy. This is to decrease the chances for the development of metabolic syndrome syndrome & other adverse effects usually associated with use of multiple antipsychotic therapies. This has to be done within the discretion & proper judgement of his  outpatient psychiatric provider.    It did take quite sometime with encouragement & medication dose adjustments for Harvie's symptoms to respond well to his treatment regimen. This is evidenced by his daily reports of gradual improvement in symptoms, improvement in his affect/eye contact & personal hygiene. During the course of his hospitalization, the 15-minute checks were adequate to ensure his safety. Patient did not display any dangerous, violent or suicidal behavior on the unit. He interacted with staff appropriately. His medications were addressed & adjusted to meet his needs. He was recommended for outpatient follow-up care & medication management with his new [facility] upon discharge to assure his continuity of care & mood stability.  At the time of this discharge, Audry is not reporting any acute suicidal/homicidal ideations or visual hallucinations. He did report that the voices are still there in his head, but were no longer saying anything to him. He currently denies any new issues or concerns. Education and supportive counseling provided throughout his hospital stay & upon discharge. Kadesh is agreeable to this discharge.   Today upon his discharge evaluation with his treatment team, Yazeed shares he is doing well. He denies any other specific concerns. He is sleeping well. His appetite is good. He denies any other physical complaints. He  denies VH, delusional thoughts or paranoia. He feels that his medications have been helpful & is in agreement to continue his current treatment regimen as recommended. He was able to engage in safety planning including plan to return to [facility] or contact emergency services if he feels unable to maintain his own safety or the safety of others. Pt had no further questions, comments, or concerns. He left [facility] with all personal belongings in no apparent distress. Transportation per family (mother).  Physical Findings: AIMS: Facial and Oral Movements Muscles of  Facial Expression: None, normal Lips and Perioral Area: None, normal Jaw: None, normal Tongue: None, normal,Extremity Movements Upper (arms, wrists, hands, fingers): None, normal Lower (legs, knees, ankles, toes): None, normal, Trunk Movements Neck, shoulders, hips: None, normal, Overall Severity Severity of abnormal movements (highest score from questions above): None, normal Incapacitation due to abnormal movements: None, normal Patient's awareness of abnormal movements (rate only patient's report): No Awareness, Dental Status Current problems with teeth and/or dentures?: No Does patient usually wear dentures?: No  CIWA:    COWS:     Musculoskeletal: Strength & Muscle Tone: within normal limits Gait & Station: normal Patient leans: N/A  Psychiatric Specialty Exam:  Presentation  General Appearance: Appropriate for Environment; Casual; Fairly Groomed  Eye Contact:Good  Speech:Clear and Coherent; Normal Rate  Speech Volume:Normal  Handedness:Right   Mood and Affect  Mood:Anxious; Euthymic  Affect:Constricted; Congruent  Thought Process  Thought Processes:Linear  Descriptions of Associations:Intact  Orientation:Full (Time, Place and Person)  Thought Content:Logical  History of Schizophrenia/Schizoaffective disorder:Yes  Duration of Psychotic Symptoms:Greater than six months  Hallucinations:Hallucinations: Auditory (denies having command AH) Description of Command Hallucinations: Patient reports, "The voices are there, but saying nothing". Description of Auditory Hallucinations: Patient reports, "The voices are there, but saying nothing".  Ideas of Reference:None  Suicidal Thoughts:Suicidal Thoughts: No SI Active Intent and/or Plan: Without Intent; Without Plan; Without Means to Carry Out; Without Access to Means SI Passive Intent and/or Plan: Without Intent; Without Plan; Without Access to Means; Without Means to Carry Out  Homicidal Thoughts:Homicidal  Thoughts: No HI Passive Intent and/or Plan: Without Intent; Without Plan; Without Means to Carry Out; Without Access to Means  Sensorium  Memory:Immediate Good; Recent Good; Remote Good  Judgment:Good  Insight:Good  Executive Functions  Concentration:Good  Attention Span:Good  [place] of Knowledge:Good  Language:Good  Psychomotor Activity  Psychomotor Activity:Psychomotor Activity: Normal  Assets  Assets:Communication Skills; Desire for Improvement; Financial Resources/Insurance; Housing; Physical Health; Resilience; Social Support  Sleep  Sleep:Sleep: Good Number of Hours of Sleep: 6  Physical Exam: Physical Exam Vitals and nursing note reviewed.  HENT:     Head: Normocephalic.     Nose: Nose normal.     Mouth/Throat:     Pharynx: Oropharynx is clear.  Eyes:     Pupils: Pupils are equal, round, and reactive to light.  Cardiovascular:     Rate and Rhythm: Normal rate.     Pulses: Normal pulses.  Pulmonary:     Effort: Pulmonary effort is normal.  Genitourinary:    Comments: Deferred Musculoskeletal:        General: Normal range of motion.     Cervical back: Normal range of motion.  Skin:    General: Skin is warm and dry.  Neurological:     General: No focal deficit present.     Mental Status: He is alert and oriented to person, place, and time. Mental status is at baseline.   Review of Systems  Constitutional:  Negative for chills, diaphoresis and fever.  HENT:  Negative for congestion and sore throat.   Eyes:  Negative for blurred vision.  Respiratory:  Negative for cough, shortness of breath and wheezing.   Cardiovascular:  Negative for chest pain and palpitations.  Gastrointestinal:  Negative for abdominal pain, constipation, diarrhea, heartburn, nausea and vomiting.  Genitourinary:  Negative for dysuria.  Musculoskeletal:  Negative for joint pain and myalgias.  Skin: Negative.   Neurological:  Negative for dizziness, tingling, tremors,  sensory change, speech change, focal weakness, seizures, loss of consciousness, weakness and headaches.  Endo/Heme/Allergies:        Allergies: Augmentin  Psychiatric/Behavioral:  Positive for depression (Hx of (stable on medication).). Negative for hallucinations (Hx of (stable on medication).), memory loss, substance abuse and suicidal ideas. The patient is not nervous/anxious (Hx of (stable on medication)) and does not have insomnia.   Blood pressure 113/60, pulse 93, temperature 98.4 F (36.9 C), temperature source Oral, resp. rate 16, height 6' 1.23" (1.86 m), weight 94.1 kg, SpO2 99 %. Body mass index is 27.19 kg/m.  Social History   Tobacco Use  Smoking Status Never  Smokeless Tobacco Never   Tobacco Cessation:  N/A, patient does not currently use tobacco products  Blood Alcohol level:  Lab Results  Component Value Date   ETH <10 01/02/2022   ETH <10 123XX123   Metabolic Disorder Labs:  Lab Results  Component Value Date   HGBA1C 4.6 (L) 01/04/2022   MPG 85.32 01/04/2022   No results found for: PROLACTIN Lab Results  Component Value Date   CHOL 148 01/04/2022   TRIG 104 01/04/2022   HDL 46 01/04/2022   CHOLHDL 3.2 01/04/2022   VLDL 21 01/04/2022   [place] 81 01/04/2022   See Psychiatric Specialty Exam and Suicide Risk Assessment completed by Attending Physician prior to discharge.  Discharge destination:  Home  Is patient on multiple antipsychotic therapies at discharge:  Yes,   Do you recommend tapering to monotherapy for antipsychotics?  Yes    Has Patient had three or more failed trials of antipsychotic monotherapy by history:  Yes,   Antipsychotic medications that previously failed include:   1.  Olanzapine., 2.  Risperdal., and 3.  Haldol.  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   Allergies as of 01/18/2022       Reactions   Augmentin [amoxicillin-pot Clavulanate] Rash        Medication List     TAKE these medications      Indication  FLUoxetine 40 MG capsule Commonly known as: PROZAC Take 1 capsule (40 mg total) by mouth daily. For depression Start taking on: January 19, 2022  Indication: Major Depressive Disorder   guanFACINE 1 MG tablet Commonly known as: TENEX Take 1 tablet (1 mg total) by mouth at bedtime. For ADHD  Indication: ADHD   haloperidol 5 MG tablet Commonly known as: HALDOL Take 1 tablet (5 mg total) by mouth 2 (two) times daily. For mood control  Indication: Schizophrenia   haloperidol decanoate 100 MG/ML injection Commonly known as: HALDOL DECANOATE Inject 1 mL (100 mg total) into the muscle every 30 (thirty) days. (Due on 02-12-22): For mood control Start taking on: February 12, 2022  Indication: Schizophrenia   hydrOXYzine 25 MG tablet Commonly known as: ATARAX Take 1 tablet (25 mg total)  by mouth 3 (three) times daily as needed for anxiety.  Indication: Feeling Anxious   OLANZapine 10 MG tablet Commonly known as: ZYPREXA Take 1 tablet (10 mg total) by mouth 2 (two) times daily. For mood control  Indication: Schizophrenia   traZODone 50 MG tablet Commonly known as: DESYREL Take 1 tablet (50 mg total) by mouth at bedtime as needed for sleep.  Indication: [place] on 02/07/2022.   Specialty: Psychology Why: You have an appointment for therapy services on 02/07/22 at 10:00 am.   This appointment will be held in person. * YOU MUST CALL TO OBTAIN PAPERWORK AND SUBMIT ONE WEEK PRIOR TO APPT * If you need to reschedule, please give 24 hours notice. Contact information: [place] [address] [place], [place]. Go on 02/01/2022.   Why: You have an appointment for  psychiatric medication management services on 02/01/22 at 2:00 pm.  This appointment will be held in person. Contact information: [address] [place] [address] [phone]         [facility] Follow up on 02/22/2022.   Specialty: Behavioral Health Why: You have an appointment for therapy services on 02/22/22 at 1:00 pm.  Please call if you do not wish to keep this appointment. Contact information: [place] Follow up.   Why: [facility] is to follow up with you at home after discharge. Contact information: [place] [address] [phone]                Follow-up recommendations: Activity:  As tolerated Diet: As recommended by your primary care doctor. Keep all scheduled follow-up appointments as recommended.   Comments: Comments: Patient is recommended to follow-up care on an outpatient basis as noted above. Prescriptions sent to pt's pharmacy of choice at discharge.   Patient agreeable to plan.   Given opportunity to ask questions.   Appears to feel comfortable with discharge denies any current suicidal or homicidal thought. Patient is also instructed prior to discharge to: Take all medications as prescribed by his/her mental healthcare provider. Report any adverse effects and or reactions from the medicines to his/her outpatient provider promptly. Patient has been instructed & cautioned: To not engage in alcohol and or illegal drug use while on prescription medicines. In the event of worsening symptoms, patient is instructed to call the crisis hotline, 911 and or go to the nearest ED for appropriate evaluation and treatment of symptoms. To follow-up with his/her primary care provider for your other medical issues, concerns and or health care needs.    Signed: Lindell Spar, NP, pmhnp, fnp-bc 01/18/2022, 9:51  AM

## 2022-01-18 NOTE — [facility] Suicide Risk Assessment (Signed)
[facility] Discharge Suicide Risk Assessment   Principal Problem: Schizophrenia, paranoid [facility]) Discharge Diagnoses: Principal Problem:   Schizophrenia, paranoid (HCC)   Total Time spent with patient: 15 minutes  Pt is a 26 year old Caucasian male with hx of mental health issues (Schizophrenia/bipolar disorder). He walked-in to the [facility] yesterday with complaint of command auditory hallucinations and having suicidal thoughts. After medical screening by one of the [facility] resident doctors, he was recommended for inpatient admission for mood stabilization treatments.   During the patient's hospitalization, patient had extensive initial psychiatric evaluation, and follow-up psychiatric evaluations every day.  Psychiatric diagnoses provided upon initial assessment:  Schizophrenia History of bipolar disorder History of GAD Nicotine use disorder   Psychiatric diagnoses were revised during admission to: Schizoaffective disorder, bipolar type - current mood episode is depressed  GAD H/o ADHD Nicotine use disorder  Patient's psychiatric medications were adjusted on admission:  -Start Zyprexa 2.5 mg once today. -Start Zyprexa 5 mg po Q bedtime for Schizophrenia.  -Start  Trazodone 50 mg po Q hs prn for insomnia.  During the hospitalization, other adjustments were made to the patient's psychiatric medication regimen:  -Zyprexa was increased to 10 mg bid -Haldol was started and increased to 7.5 mg bid. Haldol dec was administered (100 IM on 01-13-22 and 50 mg IM on 01-17-22, which was equal to this daily dose of 15 mg per day x10). Next haldol dec 150 mg is due on 02-12-22. After second haldol dec injection, his oral dose was decrased to 5 mg bid and he will be dicharged on this oral dose.  -Ativan was started for anxiety and akathesia, but was tapered off before discharge -Guanfacine 1 mg qhs was started for anxiety and inattention (pt had taken this medication for inattention in the past and responded well)   -prozac was started for depression and anxiety -colace was started for constipation, but dc prior to dc as it was no longer needed   Gradually, patient started adjusting to milieu.   Patient's care was discussed during the interdisciplinary team meeting every day during the hospitalization.  The patient denied having side effects to prescribed psychiatric medication.  The patient reports their target psychiatric symptoms of depression, anxiety, command auditory hallucinations (hallucinations continue to persist but are not command in nature) and visual hallucinations, suicidal thoughts, and homicidal thoughts, all responded well to the psychiatric medications, and the patient reports overall benefit other psychiatric hospitalization. Supportive psychotherapy was provided to the patient. The patient also participated in regular group therapy while admitted.   Labs were reviewed with the patient, and abnormal results were discussed with the patient.  The patient denied having suicidal thoughts more than 48 hours prior to discharge.  Patient denies having homicidal thoughts.  Patient denies having auditory hallucinations.  Patient denies any visual hallucinations.  Patient denies having paranoid thoughts.  The patient is able to verbalize their individual safety plan to this provider.  It is recommended to the patient to continue psychiatric medications as prescribed, after discharge from the hospital.    It is recommended to the patient to follow up with your outpatient psychiatric provider and PCP.  Discussed with the patient, the impact of alcohol, drugs, tobacco have been there overall psychiatric and medical wellbeing, and total abstinence from substance use was recommended the patient.   Musculoskeletal: Strength & Muscle Tone: within normal limits Gait & Station: normal Patient leans: N/A  Psychiatric Specialty Exam  Presentation  General Appearance: Appropriate for Environment;  Casual; Fairly  Groomed  Eye Contact:Good  Speech:Clear and Coherent; Normal Rate  Speech Volume:Normal  Handedness:Right   Mood and Affect  Mood:Anxious; Euthymic  Duration of Depression Symptoms: No data recorded Affect:Constricted; Congruent   Thought Process  Thought Processes:Linear  Descriptions of Associations:Intact  Orientation:Full (Time, Place and Person)  Thought Content:Logical  History of Schizophrenia/Schizoaffective disorder:Yes  Duration of Psychotic Symptoms:Greater than six months  Hallucinations:Hallucinations: Auditory (denies having command AH) Description of Command Hallucinations: Patient reports, "The voices are there, but saying nothing". Description of Auditory Hallucinations: Patient reports, "The voices are there, but saying nothing".  Ideas of Reference:None  Suicidal Thoughts:Suicidal Thoughts: No SI Active Intent and/or Plan: Without Intent; Without Plan; Without Means to Carry Out; Without Access to Means SI Passive Intent and/or Plan: Without Intent; Without Plan; Without Access to Means; Without Means to Carry Out  Homicidal Thoughts:Homicidal Thoughts: No HI Passive Intent and/or Plan: Without Intent; Without Plan; Without Means to Carry Out; Without Access to Means   Sensorium  Memory:Immediate Good; Recent Good; Remote Good  Judgment:Good  Insight:Good   Executive Functions  Concentration:Good  Attention Span:Good  Recall:Good  Fund of Knowledge:Good  Language:Good   Psychomotor Activity  Psychomotor Activity:Psychomotor Activity: Normal   Assets  Assets:Communication Skills; Desire for Improvement; Financial Resources/Insurance; Housing; Physical Health; Resilience; Social Support   Sleep  Sleep:Sleep: Good Number of Hours of Sleep: 6   Physical Exam: Physical Exam Vitals reviewed.  Constitutional:      General: He is not in acute distress.    Appearance: He is normal weight. He is not  ill-appearing or toxic-appearing.  Pulmonary:     Effort: Pulmonary effort is normal.  Neurological:     Mental Status: He is alert.     Motor: No weakness.     Gait: Gait normal.  Psychiatric:        Mood and Affect: Mood normal.        Behavior: Behavior normal.        Thought Content: Thought content normal.        Judgment: Judgment normal.   Review of Systems  Constitutional:  Negative for chills and fever.  Cardiovascular:  Negative for chest pain and palpitations.  Neurological:  Negative for dizziness, tingling, tremors and headaches.  Psychiatric/Behavioral:  Positive for hallucinations. Negative for depression, memory loss, substance abuse and suicidal ideas. The patient is nervous/anxious. The patient does not have insomnia.   All other systems reviewed and are negative.  Blood pressure 113/60, pulse 93, temperature 98.4 F (36.9 C), temperature source Oral, resp. rate 16, height 6' 1.23" (1.86 m), weight 94.1 kg, SpO2 99 %. Body mass index is 27.19 kg/m.  Mental Status Per Nursing Assessment::   On Admission:  Self-harm thoughts, Suicide plan, Suicidal ideation indicated by patient, Plan to harm others, Thoughts of violence towards others  Demographic factors:  Male Loss Factors:  Loss of significant relationship Historical Factors:  Impulsivity, Prior suicide attempts, Family history of mental illness or substance abuse Risk Reduction Factors:  Positive social support  Continued Clinical Symptoms:  Schizoaffective disorder - mood is stable. Depression has resolved. Ah persist but are no longer command in nature. Denies SI. Denies HI.   Cognitive Features That Contribute To Risk:  None    Suicide Risk:  Mild:  There are no identifiable suicide plans, no associated intent, mild dysphoria and related symptoms, good self-control (both objective and subjective assessment), few other risk factors, and identifiable protective factors, including available and accessible  social support.  Follow-up Information     [facility] Go on 02/07/2022.   Specialty: Psychology Why: You have an appointment for therapy services on 02/07/22 at 10:00 am.   This appointment will be held in person. * YOU MUST CALL TO OBTAIN PAPERWORK AND SUBMIT ONE WEEK PRIOR TO APPT * If you need to reschedule, please give 24 hours notice. Contact information: [address] [place] [address] [phone]         [facility] Go on 02/01/2022.   Why: You have an appointment for psychiatric medication management services on 02/01/22 at 2:00 pm.  This appointment will be held in person. Contact information: [address] [place] [address] [phone]         [facility] Follow up on 02/22/2022.   Specialty: Behavioral Health Why: You have an appointment for therapy services on 02/22/22 at 1:00 pm.  Please call if you do not wish to keep this appointment. Contact information: [address] [place] [address] [phone]        [facility] Follow up.   Why: [facility] is to follow up with you at home after discharge. Contact information: [address] [place] [address] [phone]                 Plan Of Care/Follow-up recommendations:   Activity: as tolerated  Diet: heart healthy  Other: -Follow-up with your outpatient psychiatric provider -instructions on appointment date, time, and address (location) are provided to you in discharge paperwork.  -Take your psychiatric medications as prescribed at discharge - instructions are provided to you in the discharge paperwork  -Follow-up with outpatient primary care doctor and other specialists -for management of chronic medical disease, including: seborrheic dermatitis, wolf-parkinson-white syndrome  -Testing: Follow-up with outpatient provider for abnormal lab results: none  -Recommend  abstinence from alcohol, tobacco, and other illicit drug use at discharge.   -If your psychiatric symptoms recur, worsen, or if you have side effects to your psychiatric medications, call your outpatient psychiatric provider, [phone] or go to the nearest emergency department.  -If suicidal thoughts recur, call your outpatient psychiatric provider, [phone] or go to the nearest emergency department.   Cristy Hilts, MD 01/18/2022, 9:35 AM

## 2022-01-18 NOTE — Progress Notes (Signed)
Recreation Therapy Notes  INPATIENT RECREATION TR PLAN  Patient Details Name: Miguel Shea MRN: [id number] DOB: Jul 30, 1996 Today's Date: 01/18/2022  Rec Therapy Plan Is patient appropriate for Therapeutic Recreation?: Yes Treatment times per week: about 3 days Estimated Length of Stay: 5-7 days TR Treatment/Interventions: Group participation (Comment)  Discharge Criteria Pt will be discharged from therapy if:: Discharged Treatment plan/goals/alternatives discussed and agreed upon by:: Patient/family  Discharge Summary Short term goals set: See patient care plan Short term goals met: Not met Progress toward goals comments: Groups attended Which groups?: Wellness, Communication Reason goals not met: Pt did not engage or participate in groups. Therapeutic equipment acquired: N/A Reason patient discharged from therapy: Discharge from hospital Pt/family agrees with progress & goals achieved: Yes Date patient discharged from therapy: 01/18/22    Victorino Sparrow, Vickki Muff, Tenika Keeran A 01/18/2022, 11:28 AM

## 2022-01-18 NOTE — Group Note (Signed)
Recreation Therapy Group Note   Group Topic:Health and Wellness  Group Date: 01/18/2022 Start Time: 1000 End Time: 1035 Facilitators: Caroll Rancher, LRT,CTRS Location: [address] Dayroom   Goal Area(s) Addresses:  Patient will verbalize benefit of exercise during group session. Patient will verbalize an exercise that can be completed in their hospital room during admission. Patient will identify an exercise that can be completed post d/c.  Group Description:  Exercise.  LRT and patients discussed the importance of exercise and benefits.  LRT then led group in a series of stretches to loosen up.  Each patient will take turns leading the group in some sort of movement of their choice.  The exercise will last at least 30 minutes.  Patients were encouraged to take breaks and get water as needed.   Affect/Mood: Flat   Participation Level: None   Participation Quality: Independent   Behavior: Appropriate   Speech/Thought Process: Distracted   Insight: Good   Judgement: Good   Modes of Intervention: Music   Patient Response to Interventions:  Attentive and Disengaged   Education Outcome:  Acknowledges education and In group clarification offered    Clinical Observations/Individualized Feedback: Pt started doing some of the stretches with the group.  Pt then left and began pacing back and forth down the hall.  Pt came back, sat and watched.  Pt was preoccupied with being discharged.    Plan: Continue to engage patient in RT group sessions 2-3x/week.   Caroll Rancher, LRT,CTRS 01/18/2022 11:01 AM

## 2022-01-18 NOTE — Plan of Care (Signed)
Patient attended two recreation therapy group sessions but did not participate or engage with peers or staff.     Caroll Rancher, LRT,CTRS

## 2022-01-18 NOTE — BHH Group Notes (Signed)
Adult Psychoeducational Group Note  Date:  01/18/2022 Time:  10:41 AM  Group Topic/Focus:  Goals Group:   The focus of this group is to help patients establish daily goals to achieve during treatment and discuss how the patient can incorporate goal setting into their daily lives to aide in recovery.  Participation Level:  Active  Participation Quality:  Appropriate  Affect:  Appropriate  Cognitive:  Appropriate  Insight: Appropriate  Engagement in Group:  Engaged  Modes of Intervention:  Exploration    Dub Mikes 01/18/2022, 10:41 AM

## 2022-01-18 NOTE — Progress Notes (Signed)
Pt discharged to lobby. Pt was stable and appreciative at that time. All papers and prescriptions were given and valuables returned. Verbal understanding expressed. Denies SI/HI and A/VH. Pt given opportunity to express concerns and ask questions.  

## 2022-02-06 ENCOUNTER — Ambulatory Visit (HOSPITAL_COMMUNITY): Payer: Medicare Other | Admitting: Licensed Clinical Social Worker

## 2022-02-22 ENCOUNTER — Ambulatory Visit (HOSPITAL_COMMUNITY): Payer: Medicare Other | Admitting: Licensed Clinical Social Worker

## 2022-02-22 ENCOUNTER — Telehealth (HOSPITAL_COMMUNITY): Payer: Self-pay | Admitting: Licensed Clinical Social Worker

## 2022-02-22 ENCOUNTER — Encounter (HOSPITAL_COMMUNITY): Payer: Self-pay

## 2022-02-22 NOTE — Telephone Encounter (Signed)
LCSW sent link to pt phone. Pt came into session. LCSW introduced himself. LCSW asked "What bring you in today and what services are you looking for out of [facility]?" Pt replied, "Not therapy". LCSW stated medication management was also offered and pt states "I already have that with [facility]". LCSW asked if he would like to continue with assessment and pt declined. Pt will be marked as left without being seen.

## 2022-04-20 IMAGING — DX DG FOREARM 2V*R*
1 series · 2 of 2 positions shown · non-contrast
Comparison: None.

CLINICAL DATA: Level 2 trauma, lacerations, tourniquet in place

EXAM:
RIGHT FOREARM - 2 VIEW

[Series 1: forearmbone · 0.14mm/px · 2 of 2 slices shown]
[im 1/2]
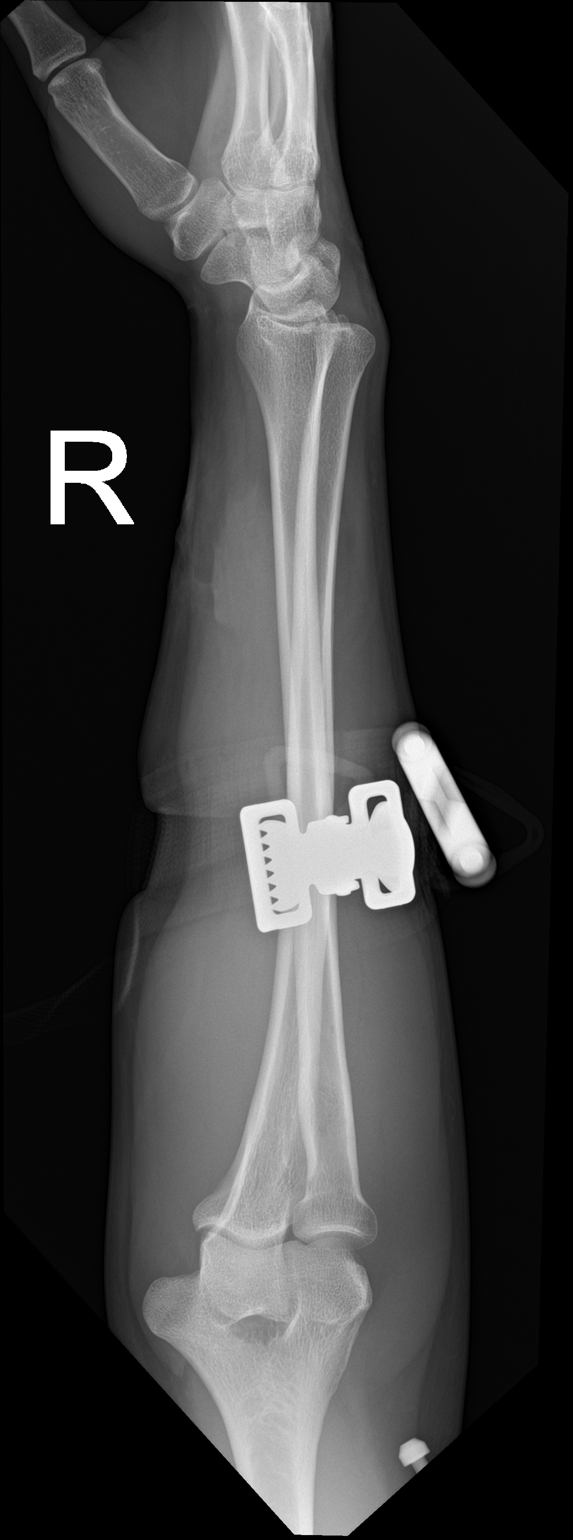
[im 2/2]
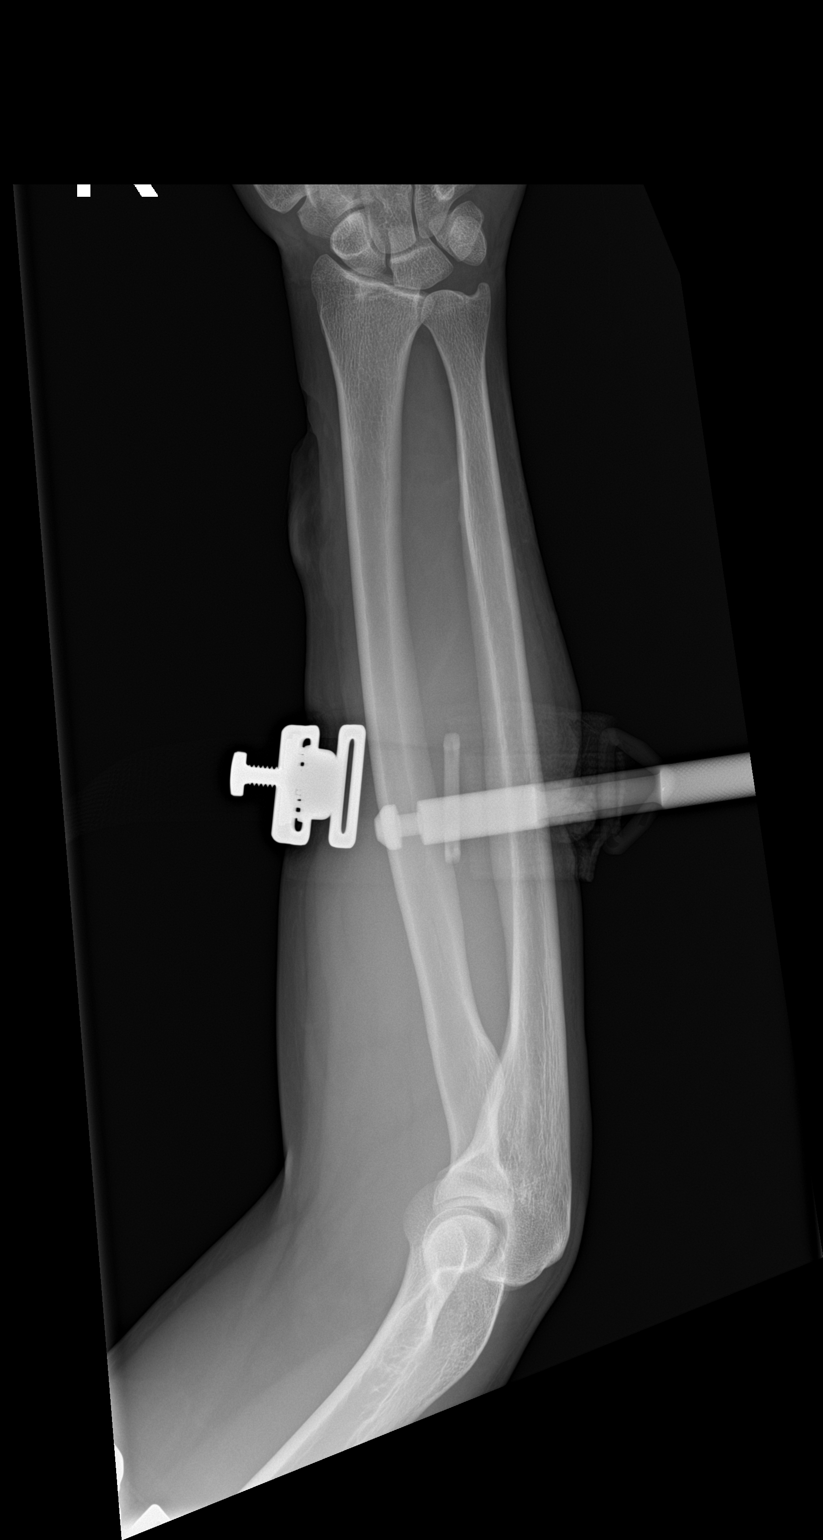

[2 of 2 positions shown; findings below may reference images not displayed]

FINDINGS: Tourniquet overlying the mid forearm.

No fracture or dislocation is seen.

The joint spaces are preserved.

Soft tissue laceration along the radial aspect of the distal
forearm. No radiopaque foreign body is seen.
IMPRESSION: Soft tissue laceration along the radial aspect of the distal
forearm.

No fracture, dislocation, or radiopaque foreign body is seen.

## 2023-02-09 ENCOUNTER — Encounter (HOSPITAL_COMMUNITY): Payer: Self-pay | Admitting: Psychiatry

## 2023-02-09 ENCOUNTER — Telehealth (HOSPITAL_COMMUNITY): Payer: Medicare Other | Admitting: Psychiatry

## 2023-02-09 ENCOUNTER — Telehealth (HOSPITAL_BASED_OUTPATIENT_CLINIC_OR_DEPARTMENT_OTHER): Payer: Medicare Other | Admitting: Psychiatry

## 2023-02-09 DIAGNOSIS — F251 Schizoaffective disorder, depressive type: Secondary | ICD-10-CM | POA: Diagnosis not present

## 2023-02-09 DIAGNOSIS — Z79899 Other long term (current) drug therapy: Secondary | ICD-10-CM

## 2023-02-09 MED ORDER — GUANFACINE HCL 1 MG PO TABS: 1.0000 mg | ORAL_TABLET | Freq: Every day | ORAL | 2 refills | Status: DC

## 2023-02-09 MED ORDER — FLUOXETINE HCL 20 MG PO CAPS: 60.0000 mg | ORAL_CAPSULE | Freq: Every day | ORAL | 2 refills | Status: DC

## 2023-02-09 MED ORDER — TRAZODONE HCL 50 MG PO TABS: 50.0000 mg | ORAL_TABLET | Freq: Every evening | ORAL | 0 refills | Status: DC | PRN

## 2023-02-09 MED ORDER — HALOPERIDOL 5 MG PO TABS: 5.0000 mg | ORAL_TABLET | Freq: Every day | ORAL | 0 refills | Status: DC

## 2023-02-09 MED ORDER — HYDROXYZINE HCL 25 MG PO TABS: 25.0000 mg | ORAL_TABLET | Freq: Three times a day (TID) | ORAL | 1 refills | Status: DC | PRN

## 2023-02-09 MED ORDER — OLANZAPINE 10 MG PO TABS: 10.0000 mg | ORAL_TABLET | Freq: Two times a day (BID) | ORAL | 0 refills | Status: DC

## 2023-02-09 MED ORDER — VARENICLINE TARTRATE 0.5 MG PO TABS: ORAL_TABLET | ORAL | 0 refills | Status: AC

## 2023-02-09 NOTE — Progress Notes (Signed)
Psychiatric Initial Adult Assessment   Patient Identification: Miguel Shea MRN:  [id number] Date of Evaluation:  02/09/2023 Referral Source: Self Chief Complaint:   Chief Complaint  Patient presents with   Establish Care   Hallucinations   Visit Diagnosis:    ICD-10-CM   1. Schizoaffective disorder, depressive type ([place])  F25.1 FLUoxetine (PROZAC) 20 MG capsule    varenicline (CHANTIX) 0.5 MG tablet    guanFACINE (TENEX) 1 MG tablet    haloperidol (HALDOL) 5 MG tablet    hydrOXYzine (ATARAX) 25 MG tablet    OLANZapine (ZYPREXA) 10 MG tablet    traZODone (DESYREL) 50 MG tablet    Lipid Profile    HgB A1c    2. Encounter for long-term (current) use of medications  Z79.899 Lipid Profile    HgB A1c       Assessment:  Miguel Shea is a 27 y.o. male with a history of schizoaffective disorder and reported autism spectrum disorder who presents virtually to [place] [facility] for initial evaluation on 02/09/2023.  Patient reports symptoms of visual hallucinations, paranoia, negative symptoms of schizophrenia, increased irritability, and auditory hallucinations which are command in nature and tell him to hurt/kill himself at times.  Patient reports that these are less intrusive than in the past and easily ignored when on the Haldol Decanoate and other antipsychotic medications.  Prior to the last month he had felt they have been managed solely by Haldol Decanoate and it was until the last week that he felt the need to restart oral antipsychotics as well.  Patient reported symptoms of lipsmacking and eye twitching which may be consistent with tardive dyskinesia.  Discussed the risk of being on multiple antipsychotics in addition to long-acting injectable.  Patient meets criteria for schizoaffective disorder.  We will continue on his current antipsychotic regimen at this time with a plan to taper off the p.o. Haldol and Zyprexa as able.  If unable we can  consider possibly in the future.  We will also titrate Prozac to 60 mg to help with negative symptoms and depressed mood.  If improvement is minimal we will consider starting a mood stabilizer instead.  A number of assessments were performed during the evaluation today including PHQ-9 which they scored a 1 on, GAD-7 which they scored a 3 on, and [place] suicide severity screening which showed no risk.  Based on these assessments patient would benefit from medication adjustment to better target their symptoms.  Plan: - Continue Zyprexa 10 mg BID, plan to discontinue once stable on IM - Continue Haldol 5 mg QD, plan to discontinue when stable on IM - Continue Haldol Decanoate 100 mg IM due on 02/12/23 - Increase Prozac to 60 mg - Continue Remeron 30 mg - Continue Trazodone 50 mg QD - Continue Atarax 25 mg TID prn for anxiety - Continue guanfacine 1 mg QD - Start Varencycline 0.5 mg QD and then increase 0.5 mg BID for 1-2 months - CBC, CMP, lipid profile, A1c, TSH reviewed - Check Lipid panel and A1c - Look into jury duty summon excusal - CT head w/o contrast reviewed - Crisis resources reviewed - Follow up in a month  History of Present Illness: Miguel Shea presents reporting that he is here to establish with a psychiatric provider as he can no longer go to his previous provider [facility].  He knows this is due to him getting insurance.  Patient notes that he has a substantial past psychiatric history first being diagnosed with  bipolar disorder as a teenager which progressed to a diagnosis of schizophrenia/schizoaffective disorder.  Patient endorses experiencing history of mood swings and manic episodes which have become less frequent since starting to experience hallucinations.  Once the hallucinations started the patient started to exhibit more negative symptoms typically seen in schizophrenia.  Patient endorses a number of past psychiatric hospitalizations and reports some stability  on his current medication regimen.  While he still does experience auditory hallucinations which can be command in nature, telling him to hurt himself/kill himself or to hurt others, he denies being as intrusive or feeling like he needs to act on them.  He also does endorse visual hallucinations which are like seeing bright dots of light in the air which can happen intermittently.  Miguel Shea feels that her symptoms are stable at the moment though he does feel the Haldol injections have been wearing off a little bit early.  When asked to clarify what this meant he described experiencing increased depression, lack of motivation, and more intrusive auditory hallucinations.  Due to this and potentially increased stress from changing providers he has restarted p.o. Haldol and Zyprexa in addition to the Haldol injection.  Patient notes there is some intermittent twitching in his eyebrow and occasional lip smacking but he denies any other side effects from the antipsychotic medication.  We discussed treatment options including patient's multiple antipsychotics in addition to the injection.  Miguel Shea notes that once he is set up on his Haldol Decanoate injections again he feels that he can probably come off the oral Haldol/Zyprexa.  He was also open to coming into the office for assessment for tardive dyskinesia.  In regards to the depressed mood he was interested in increasing Prozac to 60 mg.  We explored this and discussed the risk and benefits of any increase.  We agreed to increase the dose to 60 mg while monitoring for concerns of mania.  We also discussed considering a mood stabilizer in the future.  Patient was open to restarting Trileptal in the future if needed.  Of note patient did endorse nicotine use and asked about bupropion.  Does explain that starting bupropion in addition to Prozac and mirtazapine would not be appropriate.  Instead he can start a different medication such as Chantix for nicotine cessation.   We also went over the effects of cigarette smoke on metabolism of Zyprexa.  Associated Signs/Symptoms: Depression Symptoms:  anhedonia, fatigue, loss of energy/fatigue, disturbed sleep, (Hypo) Manic Symptoms:  Irritable Mood, Anxiety Symptoms:  Excessive Worry, Psychotic Symptoms:  Hallucinations: Auditory Command:  To kill himself which she has no intent to act and are less intrusive compared to the past. Visual Paranoia, PTSD Symptoms: NA  Past Psychiatric History: multiple past hospitalizations last one in January 2023. No prior suicide attempts   Denies any substance use  Previous Psychotropic Medications: Yes   Substance Abuse History in the last 12 months:  No.  Consequences of Substance Abuse: NA  Past Medical History:  Past Medical History:  Diagnosis Date   Bipolar 1 disorder ([place])    Wolff-Parkinson-White syndrome    No past surgical history on file.  Family Psychiatric History: An uncle who had depression  Family History: No family history on file.  Social History:   Social History   Socioeconomic History   Marital status: Single    Spouse name: Not on file   Number of children: Not on file   Years of education: Not on file   Highest education level: Not  on file  Occupational History   Not on file  Tobacco Use   Smoking status: Never   Smokeless tobacco: Never  Vaping Use   Vaping Use: Some days   Substances: Nicotine, Flavoring  Substance and Sexual Activity   Alcohol use: Never   Drug use: Never   Sexual activity: Not on file  Other Topics Concern   Not on file  Social History Narrative   ** Merged History Encounter **       Social Determinants of Health   Financial Resource Strain: Not on file  Food Insecurity: Not on file  Transportation Needs: Not on file  Physical Activity: Not on file  Stress: Not on file  Social Connections: Not on file    Additional Social History: Living on his own, working at [employer]  where he works on the [employer].  Allergies:   Allergies  Allergen Reactions   Augmentin [Amoxicillin-Pot Clavulanate] Rash    Metabolic Disorder Labs: Lab Results  Component Value Date   HGBA1C 4.6 (L) 01/04/2022   MPG 85.32 01/04/2022   No results found for: "PROLACTIN" Lab Results  Component Value Date   CHOL 148 01/04/2022   TRIG 104 01/04/2022   HDL 46 01/04/2022   CHOLHDL 3.2 01/04/2022   VLDL 21 01/04/2022   LDLCALC 81 01/04/2022   Lab Results  Component Value Date   TSH 1.942 01/04/2022    Therapeutic Level Labs: No results found for: "LITHIUM" No results found for: "CBMZ" No results found for: "VALPROATE"  Current Medications: Current Outpatient Medications  Medication Sig Dispense Refill   FLUoxetine (PROZAC) 20 MG capsule Take 3 capsules (60 mg total) by mouth daily. 90 capsule 2   varenicline (CHANTIX) 0.5 MG tablet Take 1 tablet (0.5 mg total) by mouth daily for 3 days, THEN 1 tablet (0.5 mg total) 2 (two) times daily for 20 days. 75 tablet 0   guanFACINE (TENEX) 1 MG tablet Take 1 tablet (1 mg total) by mouth at bedtime. For ADHD 30 tablet 2   haloperidol (HALDOL) 5 MG tablet Take 1 tablet (5 mg total) by mouth daily. For mood control 40 tablet 0   haloperidol decanoate (HALDOL DECANOATE) 100 MG/ML injection Inject 1 mL (100 mg total) into the muscle every 30 (thirty) days. (Due on 02-12-22): For mood control 1 mL 0   hydrOXYzine (ATARAX) 25 MG tablet Take 1 tablet (25 mg total) by mouth 3 (three) times daily as needed for anxiety. 90 tablet 1   OLANZapine (ZYPREXA) 10 MG tablet Take 1 tablet (10 mg total) by mouth 2 (two) times daily. For mood control 70 tablet 0   traZODone (DESYREL) 50 MG tablet Take 1 tablet (50 mg total) by mouth at bedtime as needed for sleep. 30 tablet 0   No current facility-administered medications for this visit.    Psychiatric Specialty Exam: Review of Systems  There were no vitals taken for this visit.There is no height or  weight on file to calculate BMI.  General Appearance: Fairly Groomed  Eye Contact:  Good  Speech:  Clear and Coherent and Normal Rate  Volume:  Normal  Mood:  Euthymic  Affect:  Blunt  Thought Process:  Coherent, Goal Directed, and Linear  Orientation:  Full (Time, Place, and Person)  Thought Content:  Hallucinations: Auditory and Paranoid Ideation  Suicidal Thoughts:  No  Homicidal Thoughts:  No  Memory:  NA  Judgement:  Good  Insight:  Fair  Psychomotor Activity:  TD and  some lip smacking intermittently occasional eyebrows twitch  Concentration:  Concentration: Good  Recall:  Good  Fund of Knowledge:Good  Language: Good  Akathisia:  No    AIMS (if indicated):  not done  Assets:  Communication Skills Desire for [place] Talents/Skills Vocational/Educational  ADL's:  Intact  Cognition: WNL  Sleep:  Good   Screenings: AIMS    Flowsheet Row Admission (Discharged) from 01/02/2022 in [facility]  AIMS Total Score 0      AUDIT    Flowsheet Row Admission (Discharged) from 01/02/2022 in [facility]  Alcohol Use Disorder Identification Test Final Score (AUDIT) 0      GAD-7    Flowsheet Row Video Visit from 02/09/2023 in [facility]  Total GAD-7 Score 3      PHQ2-9    Flowsheet Row Video Visit from 02/09/2023 in [facility]  PHQ-2 Total Score 1      Flowsheet Row Video Visit from 02/09/2023 in [facility] Most recent reading at 02/09/2023 10:35 AM Admission (Discharged) from 01/02/2022 in [facility] Most recent reading at 01/02/2022  9:45 PM ED from 01/02/2022 in [facility] at [facility] Most recent reading at 01/02/2022  5:53 PM  C-SSRS RISK CATEGORY Low Risk High Risk High Risk         Collaboration of Care: Medication Management AEB medication prescription and Psychiatrist AEB chart review  Patient/Guardian was advised Release of Information must be obtained prior to any record release in order to collaborate their care with an outside provider. Patient/Guardian was advised if they have not already done so to contact the registration department to sign all necessary forms in order for [place] to release information regarding their care.   Consent: Patient/Guardian gives verbal consent for treatment and assignment of benefits for services provided during this visit. Patient/Guardian expressed understanding and agreed to proceed.   Vista Mink, MD 2/10/202412:04 PM    Virtual Visit via Video Note  I connected with Wenda Low on 02/09/23 at 10:00 AM EST by a video enabled telemedicine application and verified that I am speaking with the correct person using two identifiers.  Location: Patient: Home Provider: Home office   I discussed the limitations of evaluation and management by telemedicine and the availability of in person appointments. The patient expressed understanding and agreed to proceed.   I discussed the assessment and treatment plan with the patient. The patient was provided an opportunity to ask questions and all were answered. The patient agreed with the plan and demonstrated an understanding of the instructions.   The patient was advised to call back or seek an in-person evaluation if the symptoms worsen or if the condition fails to improve as anticipated.  I provided 45 minutes of non-face-to-face time during this encounter.   Vista Mink, MD

## 2023-02-11 ENCOUNTER — Other Ambulatory Visit (HOSPITAL_COMMUNITY): Payer: Self-pay | Admitting: Psychiatry

## 2023-02-11 DIAGNOSIS — F251 Schizoaffective disorder, depressive type: Secondary | ICD-10-CM

## 2023-02-11 MED ORDER — HALOPERIDOL DECANOATE 100 MG/ML IM SOLN: 100.0000 mg | INTRAMUSCULAR | 11 refills | Status: DC

## 2023-02-14 ENCOUNTER — Ambulatory Visit (HOSPITAL_BASED_OUTPATIENT_CLINIC_OR_DEPARTMENT_OTHER): Payer: Medicare Other | Admitting: *Deleted

## 2023-02-14 VITALS — BP 124/87 | HR 82 | Resp 20 | Ht 72.0 in | Wt 263.0 lb

## 2023-02-14 DIAGNOSIS — F251 Schizoaffective disorder, depressive type: Secondary | ICD-10-CM

## 2023-02-14 MED ORDER — HALOPERIDOL DECANOATE 100 MG/ML IM SOLN
100.0000 mg | Freq: Once | INTRAMUSCULAR | Status: AC
  Administered 2023-02-14: 100 mg via INTRAMUSCULAR

## 2023-02-14 NOTE — Patient Instructions (Signed)
Pt presents today for his first due Haldol Dec 100 mg injection to be given in this clinic. Pt is a new pt of Dr. Nelida Gores. Pt presents with constricted affect and no eye contact. Pt averts gaze. Pt appears disheveled with poor hygiene and dirty clothes. Pt said he was extremely anxious as he hasn't had injection "in awhile" due to changing providers. Initial BP reading in L arm was 157/100 which pt says was due to anxiety. Rechecked in R arm after settling down and result was 124/87. Pt says this is more his normal BP. Pt did endorse command AVH to hurt self and others. Pt says this has been getting worse the longer he goes without injection. Injection prepared as ordered and given in RD without complaint. Writer walked pt out to desk to ensure he made a 4 week f/u for next due injection. Pt encouraged to call office with any questions or concerns.

## 2023-03-14 ENCOUNTER — Ambulatory Visit (HOSPITAL_BASED_OUTPATIENT_CLINIC_OR_DEPARTMENT_OTHER): Payer: Medicare Other | Admitting: *Deleted

## 2023-03-14 VITALS — BP 130/82 | HR 108 | Resp 20 | Ht 73.0 in | Wt 265.0 lb

## 2023-03-14 DIAGNOSIS — F251 Schizoaffective disorder, depressive type: Secondary | ICD-10-CM | POA: Diagnosis not present

## 2023-03-14 MED ORDER — HALOPERIDOL DECANOATE 100 MG/ML IM SOLN
100.0000 mg | Freq: Once | INTRAMUSCULAR | Status: AC
  Administered 2023-03-14: 100 mg via INTRAMUSCULAR

## 2023-03-14 NOTE — Patient Instructions (Addendum)
Pt in clinic today for due Haldol Decanoate 100 mg injection. Pt anxious but more engaged than last visit. Andri continues to endorse AVH with SI and HI. No specific plan or intent he says and no one specific he says. Pt is happy about increase in Prozac and is hopeful that will help. Pt also thinks maybe he needs to be on a mood stabilizer as well. This nurse will relay to Dr. Nelida Gores. Injection prepared as ordered and given in LD without c/o. Pt to return in 4 weeks for next due injection. Pt agrees to call this nurse/ office with any questions or concerns.

## 2023-03-18 ENCOUNTER — Other Ambulatory Visit (HOSPITAL_COMMUNITY): Payer: Self-pay | Admitting: Psychiatry

## 2023-03-18 DIAGNOSIS — F251 Schizoaffective disorder, depressive type: Secondary | ICD-10-CM

## 2023-03-19 ENCOUNTER — Telehealth (HOSPITAL_COMMUNITY): Payer: Self-pay | Admitting: *Deleted

## 2023-03-19 NOTE — Telephone Encounter (Signed)
Pt requests jury duty excuse. Letter written with Dr. Nelida Gores support. Pt to pick up today.

## 2023-03-25 ENCOUNTER — Encounter (HOSPITAL_COMMUNITY): Payer: Self-pay | Admitting: Psychiatry

## 2023-03-25 ENCOUNTER — Ambulatory Visit (HOSPITAL_BASED_OUTPATIENT_CLINIC_OR_DEPARTMENT_OTHER): Payer: Medicare Other | Admitting: Psychiatry

## 2023-03-25 VITALS — BP 138/87 | HR 91 | Ht 72.0 in | Wt 266.0 lb

## 2023-03-25 DIAGNOSIS — Z79899 Other long term (current) drug therapy: Secondary | ICD-10-CM

## 2023-03-25 DIAGNOSIS — F251 Schizoaffective disorder, depressive type: Secondary | ICD-10-CM | POA: Diagnosis not present

## 2023-03-25 DIAGNOSIS — E559 Vitamin D deficiency, unspecified: Secondary | ICD-10-CM

## 2023-03-25 MED ORDER — FLUOXETINE HCL 20 MG PO CAPS: 60.0000 mg | ORAL_CAPSULE | Freq: Every day | ORAL | 2 refills | Status: DC

## 2023-03-25 MED ORDER — OXCARBAZEPINE 150 MG PO TABS: 150.0000 mg | ORAL_TABLET | Freq: Two times a day (BID) | ORAL | 2 refills | Status: DC

## 2023-03-25 MED ORDER — HALOPERIDOL DECANOATE 100 MG/ML IM SOLN: 200.0000 mg | INTRAMUSCULAR | 11 refills | Status: DC

## 2023-03-25 MED ORDER — HALOPERIDOL 5 MG PO TABS: 5.0000 mg | ORAL_TABLET | Freq: Every day | ORAL | 2 refills | Status: DC

## 2023-03-25 MED ORDER — MIRTAZAPINE 30 MG PO TABS: 30.0000 mg | ORAL_TABLET | Freq: Every day | ORAL | 2 refills | Status: DC

## 2023-03-25 MED ORDER — GUANFACINE HCL 1 MG PO TABS: 1.0000 mg | ORAL_TABLET | Freq: Every day | ORAL | 2 refills | Status: DC

## 2023-03-25 MED ORDER — OLANZAPINE 10 MG PO TABS: 20.0000 mg | ORAL_TABLET | Freq: Every day | ORAL | 0 refills | Status: DC

## 2023-03-25 MED ORDER — TRAZODONE HCL 50 MG PO TABS: 50.0000 mg | ORAL_TABLET | Freq: Every evening | ORAL | 2 refills | Status: DC | PRN

## 2023-03-25 NOTE — Progress Notes (Signed)
BH MD/PA/NP OP Progress Note  03/25/2023 10:23 AM Miguel Shea  MRN:  [id number]  Visit Diagnosis:    ICD-10-CM   1. Schizoaffective disorder, depressive type ([place])  F25.1     2. Encounter for long-term (current) use of medications  Z79.899       Assessment: Miguel Shea is a 27 y.o. male with a history of schizoaffective disorder and reported autism spectrum disorder who presented to [facility] for initial evaluation on 02/09/2023.  At initial evaluation patient reported symptoms of visual hallucinations, paranoia, negative symptoms of schizophrenia, increased irritability, and auditory hallucinations which are command in nature and tell him to hurt/kill himself at times.  Patient reported that these are less intrusive than in the past and easily ignored when on the Haldol Decanoate and other antipsychotic medications.  Prior to the last month he had felt they have been managed solely by Haldol Decanoate and it was until the last week that he felt the need to restart oral antipsychotics as well.  Patient reported symptoms of lipsmacking and eye twitching which may be consistent with tardive dyskinesia.  Discussed the risk of being on multiple antipsychotics in addition to long-acting injectable.  Patient met criteria for schizoaffective disorder.   Miguel Shea presents for follow-up evaluation. Today, 03/25/23, patient reports some mild improvement in mood over the past month though he continues struggle with SI and thoughts of self-harm.  He denies any adverse side effects from the increase in Prozac.  Patient has also restarted on the Haldol Decanoate and notes some decrease in his intrusive thoughts/hallucinations.  We will start Trileptal 150 mg twice a day, and increase Haldol decanoate to 200 mg a month.  We will also plan to taper Zyprexa to 10 mg at bedtime before the next Haldol injection.  Risk and benefits of these changes were discussed and  patient was in agreement.  He will follow up in a month.  Plan: - Continue Zyprexa 20 mg QHS, taper to 10 mg QHS on 04/09/23  - Continue Haldol 5 mg QD, plan to discontinue when stable on IM - Increase Haldol Decanoate to 200 mg IM last on 03/14/23, next due on 04/11/23 - Increase Trileptal to 150 mg BID - Continue Prozac 60 mg QD - Continue Remeron 30 mg QHS - Continue Trazodone 50 mg QD - Continue Atarax 25 mg TID prn for anxiety - Continue guanfacine 1 mg QD - Discontinue Varencycline - CBC, CMP, lipid profile, A1c, TSH reviewed - Check Lipid panel, Vit D, and A1c - Look into jury duty summon excusal - CT head w/o contrast reviewed - Crisis resources reviewed - Follow up in a month   Chief Complaint:  Chief Complaint  Patient presents with   Follow-up   HPI: Patient presents reporting that he has been alright over the past 6 weeks.  He noticed some improvement in his mood with the Prozac but is still endorsing SI with thoughts about plan.  Miguel Shea reports that these symptoms occur when he becomes frustrated.  He notes that he can internalize the frustration and he can build up to thoughts about self-harming.  He denies acting on any of this over the past 6 weeks.  As far as a hallucination side of things go patient reports that they are not as intrusive.  He can still experience internal monologue as louder at times of stress but denies it being the same as hearing voices.  He also notes occasional flickers of lights  that also increased during times of stress.  Patient expressed that he feels the medication still could benefit from further adjustment to help with the frustration/suicidal ideation piece along with his sleep and internal monologue.  We discussed options including starting a mood stabilizer and after review agreed to start Trileptal.  Patient notes that he had benefit from Tegretol in the past however due to the multiple interactions with his current medications Trileptal is  found to be a better fit.  He also reported negative effects from lithium, Depakote, and Lamictal.  We also discussed the benefit of being on 1 antipsychotic instead of 2.  Patient was open to increasing the Haldol decanoate with the goal to taper down on the Zyprexa.  Outside of this patient was open to trying to take trazodone more regularly for sleep as opposed to starting Seroquel.  He was also open to holding off on discontinuing Atarax and starting an alternative until these changes are completed.  As for the Varencycline he reports that he is not quite ready to stop smoking at this time and did not start the medication.  Past Psychiatric History: Patient has multiple past hospitalizations last one in January 2023. No prior suicide attempts  Patient has tried lithium (over sedating), Depakote (leg swelling), Lamictal (irritability), Tegretol, Seroquel, Haldol, Zyprexa, Risperdal, Prozac, Remeron, Atarax, guanfacine, propranolol, and trazodone.  Denies any substance use  Past Medical History:  Past Medical History:  Diagnosis Date   Bipolar 1 disorder ([place])    Wolff-Parkinson-White syndrome    No past surgical history on file.   Family History: No family history on file.  Social History:  Social History   Socioeconomic History   Marital status: Single    Spouse name: Not on file   Number of children: Not on file   Years of education: Not on file   Highest education level: Not on file  Occupational History   Not on file  Tobacco Use   Smoking status: Never   Smokeless tobacco: Never  Vaping Use   Vaping Use: Some days   Substances: Nicotine, Flavoring  Substance and Sexual Activity   Alcohol use: Never   Drug use: Never   Sexual activity: Not on file  Other Topics Concern   Not on file  Social History Narrative   ** Merged History Encounter **       Social Determinants of Health   Financial Resource Strain: Not on file  Food Insecurity: Not on file  Transportation  Needs: Not on file  Physical Activity: Not on file  Stress: Not on file  Social Connections: Not on file    Allergies:  Allergies  Allergen Reactions   Augmentin [Amoxicillin-Pot Clavulanate] Rash    Current Medications: Current Outpatient Medications  Medication Sig Dispense Refill   FLUoxetine (PROZAC) 20 MG capsule Take 3 capsules (60 mg total) by mouth daily. 90 capsule 2   guanFACINE (TENEX) 1 MG tablet Take 1 tablet (1 mg total) by mouth at bedtime. For ADHD 30 tablet 2   haloperidol (HALDOL) 5 MG tablet TAKE ONE TABLET BY MOUTH DAILY 40 tablet 0   haloperidol decanoate (HALDOL DECANOATE) 100 MG/ML injection Inject 1 mL (100 mg total) into the muscle every 30 (thirty) days. (Due on 02-12-22): For mood control 1 mL 11   hydrOXYzine (ATARAX) 25 MG tablet Take 1 tablet (25 mg total) by mouth 3 (three) times daily as needed for anxiety. 90 tablet 1   OLANZapine (ZYPREXA) 10 MG tablet TAKE  ONE TABLET BY MOUTH TWICE A DAY 70 tablet 0   traZODone (DESYREL) 50 MG tablet TAKE 1 TABLET (50 MG TOTAL) BY MOUTH AT BEDTIME AS NEEDED FOR SLEEP. 30 tablet 0   No current facility-administered medications for this visit.     Musculoskeletal: Strength & Muscle Tone: within normal limits Gait & Station: normal Patient leans: N/A. AIMS scale performed and within normal limits.  Patient exhibited no signs of tardive dyskinesia or lip smacking behaviors.  No cogwheeling was noted on exam and gait was within normal limits.  Psychiatric Specialty Exam: Review of Systems  Blood pressure 138/87, pulse 91, height 6' (1.829 m), weight 266 lb (120.7 kg).Body mass index is 36.08 kg/m.  General Appearance: Fairly Groomed  Eye Contact:  Good  Speech:  Clear and Coherent and Normal Rate  Volume:  Normal  Mood:  Anxious  Affect:  Congruent  Thought Process:  Coherent and Goal Directed  Orientation:  Full (Time, Place, and Person)  Thought Content: Logical   Suicidal Thoughts:  Yes.  without  intent/plan  Homicidal Thoughts:  No  Memory:  Immediate;   Fair  Judgement:  Fair  Insight:  Fair  Psychomotor Activity:  Normal  Concentration:  Concentration: Fair  Recall:  [place] of Knowledge: Fair  Language: Good  Akathisia:  No    AIMS (if indicated): done  Assets:  Communication Skills Desire for Improvement Housing Physical Health Talents/Skills Transportation Vocational/Educational  ADL's:  Intact  Cognition: WNL  Sleep:  Fair   Metabolic Disorder Labs: Lab Results  Component Value Date   HGBA1C 4.6 (L) 01/04/2022   MPG 85.32 01/04/2022   No results found for: "PROLACTIN" Lab Results  Component Value Date   CHOL 148 01/04/2022   TRIG 104 01/04/2022   HDL 46 01/04/2022   CHOLHDL 3.2 01/04/2022   VLDL 21 01/04/2022   [place] 81 01/04/2022   Lab Results  Component Value Date   TSH 1.942 01/04/2022    Therapeutic Level Labs: No results found for: "LITHIUM" No results found for: "VALPROATE" No results found for: "CBMZ"   Screenings: [place] Admission (Discharged) from 01/02/2022 in [facility]  AIMS Total Score 0      AUDIT    Flowsheet Row Admission (Discharged) from 01/02/2022 in [facility]  Alcohol Use Disorder Identification Test Final Score (AUDIT) 0      GAD-7    Flowsheet Row Video Visit from 02/09/2023 in [facility]  Total GAD-7 Score 3      PHQ2-9    Flowsheet Row Video Visit from 02/09/2023 in [facility]  PHQ-2 Total Score 1      Flowsheet Row Video Visit from 02/09/2023 in [facility] Most recent reading at 02/09/2023 10:35 AM Admission (Discharged) from 01/02/2022 in [facility] Most recent reading at 01/02/2022  9:45 PM ED from 01/02/2022 in [facility] Emergency Department at [facility] Most recent reading at 01/02/2022  5:53 PM  C-SSRS RISK CATEGORY Low Risk High Risk High Risk       Collaboration of Care: Collaboration of Care: Medication Management AEB medication prescription  Patient/Guardian was advised Release of Information must be obtained prior to any record release in order to collaborate their care with an outside provider. Patient/Guardian was advised if they have not already done so to contact the registration department to  sign all necessary forms in order for [place] to release information regarding their care.   Consent: Patient/Guardian gives verbal consent for treatment and assignment of benefits for services provided during this visit. Patient/Guardian expressed understanding and agreed to proceed.    Vista Mink, MD 03/25/2023, 10:23 AM

## 2023-03-25 NOTE — Patient Instructions (Addendum)
Thank you for attending your appointment today.  - Continue Zyprexa 20 mg at bedtime, decrease to 10 mg at bedtime on 04/09/23  - Continue Haldol 5 mg daily, plan to discontinue when stable on IM - Increase Haldol Decanoate to 200 mg IM last on 03/14/23, next due on 04/11/23 - Start Trileptal to 150 mg twice a day - Continue Prozac 60 mg daily - Continue Remeron 30 mg at bedtime - Continue Trazodone 50 mg daily - Continue Atarax 25 mg three times a day for anxiety as needed - Continue guanfacine 1 mg QD - Discontinue Varencycline - CBC, CMP, lipid profile, A1c, TSH reviewed - Check Lipid panel, Vit D, and A1c - CT head w/o contrast reviewed - Crisis resources reviewed - Follow up in a month  Please do not make any changes to medications without first discussing with your provider. If you are experiencing a psychiatric emergency, please call 911 or present to your nearest emergency department. Additional crisis, medication management, and therapy resources are included below.  [facility]  [address] [place], [place] [address] [phone] or [phone] [facility] 24/7 FOR ANYONE [address] [place], [place] Fax: [phone] [email or web address] *Interpreters available *Accepts all insurance and uninsured for Urgent Care needs

## 2023-04-11 ENCOUNTER — Ambulatory Visit (HOSPITAL_BASED_OUTPATIENT_CLINIC_OR_DEPARTMENT_OTHER): Payer: Medicare Other | Admitting: *Deleted

## 2023-04-11 ENCOUNTER — Encounter (HOSPITAL_COMMUNITY): Payer: Self-pay

## 2023-04-11 VITALS — BP 137/89 | HR 88 | Ht 72.0 in | Wt 269.0 lb

## 2023-04-11 DIAGNOSIS — F251 Schizoaffective disorder, depressive type: Secondary | ICD-10-CM

## 2023-04-11 MED ORDER — HALOPERIDOL DECANOATE 100 MG/ML IM SOLN
100.0000 mg | Freq: Once | INTRAMUSCULAR | Status: AC
  Administered 2023-04-11: 100 mg via INTRAMUSCULAR

## 2023-04-11 NOTE — Progress Notes (Signed)
haloperidol decanoate (HALDOL DECANOATE) 100 MG/ML injection  Inject 2 mLs (200 mg total) into the muscle every 30 (thirty) days.  Patient arrived for injection & tolerated well in Right-Arm.  NO Complaints . "Everything is OK"

## 2023-04-11 NOTE — Patient Instructions (Signed)
haloperidol decanoate (HALDOL DECANOATE) 100 MG/ML injection

## 2023-04-15 ENCOUNTER — Other Ambulatory Visit (HOSPITAL_COMMUNITY): Payer: Self-pay | Admitting: Psychiatry

## 2023-04-15 DIAGNOSIS — F251 Schizoaffective disorder, depressive type: Secondary | ICD-10-CM

## 2023-04-25 ENCOUNTER — Telehealth (HOSPITAL_BASED_OUTPATIENT_CLINIC_OR_DEPARTMENT_OTHER): Payer: Medicare Other | Admitting: Psychiatry

## 2023-04-25 ENCOUNTER — Other Ambulatory Visit (HOSPITAL_COMMUNITY): Payer: Self-pay | Admitting: *Deleted

## 2023-04-25 ENCOUNTER — Encounter (HOSPITAL_COMMUNITY): Payer: Self-pay | Admitting: Psychiatry

## 2023-04-25 DIAGNOSIS — F251 Schizoaffective disorder, depressive type: Secondary | ICD-10-CM | POA: Diagnosis not present

## 2023-04-25 DIAGNOSIS — Z79899 Other long term (current) drug therapy: Secondary | ICD-10-CM | POA: Diagnosis not present

## 2023-04-25 MED ORDER — OXCARBAZEPINE 300 MG PO TABS: 300.0000 mg | ORAL_TABLET | Freq: Two times a day (BID) | ORAL | 2 refills | Status: DC

## 2023-04-25 NOTE — Progress Notes (Signed)
BH MD/PA/NP OP Progress Note  04/25/2023 1:15 PM Miguel Shea  MRN:  [id number]  Visit Diagnosis:    ICD-10-CM   1. Schizoaffective disorder, depressive type  F25.1 Oxcarbazepine (TRILEPTAL) 300 MG tablet    B12 and Folate Panel    2. Encounter for long-term (current) use of medications  Z79.899 B12 and Folate Panel      Assessment: Miguel Shea is a 27 y.o. male with a history of schizoaffective disorder and reported autism spectrum disorder who presented to [facility] for initial evaluation on 02/09/2023.  At initial evaluation patient reported symptoms of visual hallucinations, paranoia, negative symptoms of schizophrenia, increased irritability, and auditory hallucinations which are command in nature and tell him to hurt/kill himself at times.  Patient reported that these are less intrusive than in the past and easily ignored when on the Haldol Decanoate and other antipsychotic medications.  Prior to the last month he had felt they have been managed solely by Haldol Decanoate and it was until the last week that he felt the need to restart oral antipsychotics as well.  Patient reported symptoms of lipsmacking and eye twitching which may be consistent with tardive dyskinesia.  Discussed the risk of being on multiple antipsychotics in addition to long-acting injectable.  Patient met criteria for schizoaffective disorder.   [name] presents for follow-up evaluation. Today, 04/25/23, patient reports that the last month has gone well.  He started the increased dose of Haldol to cannulate and discontinued the oral Haldol and Zyprexa 2 weeks ago.  He denies any adverse side effects from discontinuing the 2 medications.  Patient noted some increased sedation that it is gradually tapering off after getting the increase Haldol Decanoate injection.  He denies any increase in hallucinations.  Patient also reports improved mood stability after starting  Trileptal and denies adverse side effects from medication.  At times there can be some breakthrough mood fluctuations and will increase Trileptal to 300 mg twice a day today.  Risk and benefits were discussed.  Patient will follow-up in a month and is scheduled for his next Haldol Decanoate injection on 5/11.  Plan: - Discontinue Zyprexa  - Discontinue Haldol  - Continue Haldol Decanoate 200 mg IM last on 04/11/23, next due on 05/11/23 - Increase Trileptal to 300 mg BID - Continue Prozac 60 mg QD - Continue Remeron 30 mg QHS - Continue Trazodone 50 mg QD - Continue Atarax 25 mg TID prn for anxiety - Continue guanfacine 1 mg QD - Discontinue Varencycline - CBC, CMP, lipid profile, A1c, TSH reviewed - Check Lipid panel, Vit D, B12, and A1c - CT head w/o contrast reviewed - Crisis resources reviewed - Follow up in a month   Chief Complaint:  Chief Complaint  Patient presents with   Follow-up   HPI: Miguel Shea presents reporting that he is doing alright.  Over the past month he has started the increased dose of Haldol Decanoate and he discontinued both the Zyprexa and the oral Haldol.  He notes that he discontinued a couple weeks ago and now and things seem to be going all right.  He can feel the Haldol slowly leaving the system in the sense that it is not as sedating as it was when he initially got the injection, but he denies any change or increase in hallucinations as he gets further away from the injection date.  Patient denies any significant adverse effects from discontinuing Haldol or Zyprexa.  In regards to the Trileptal  has been taking 150 twice a day and notes that it helped improve his mood stabilization.  There are times however her mood can still fluctuate.  We will increase Trileptal to 300 mg twice a day today and reviewed the risk and benefits.  Patient is scheduled for his next Haldol Decanoate injection on 5/11.  We also reviewed blood work and encouraged patient to present to the  [facility] to get that done.  Past Psychiatric History:  Patient has multiple past hospitalizations last one in January 2023. No prior suicide attempts  Patient has tried lithium (over sedating), Depakote (leg swelling), Lamictal (irritability), Tegretol, Seroquel, Haldol, Zyprexa, Risperdal, Prozac, Remeron, Atarax, guanfacine, propranolol, and trazodone.  Denies any substance use  Past Medical History:  Past Medical History:  Diagnosis Date   Bipolar 1 disorder    Wolff-Parkinson-White syndrome    No past surgical history on file.  Family History: No family history on file.  Social History:  Social History   Socioeconomic History   Marital status: Single    Spouse name: Not on file   Number of children: Not on file   Years of education: Not on file   Highest education level: Not on file  Occupational History   Not on file  Tobacco Use   Smoking status: Never   Smokeless tobacco: Never  Vaping Use   Vaping Use: Some days   Substances: Nicotine, Flavoring  Substance and Sexual Activity   Alcohol use: Never   Drug use: Never   Sexual activity: Not on file  Other Topics Concern   Not on file  Social History Narrative   ** Merged History Encounter **       Social Determinants of Health   Financial Resource Strain: Not on file  Food Insecurity: Not on file  Transportation Needs: Not on file  Physical Activity: Not on file  Stress: Not on file  Social Connections: Not on file    Allergies:  Allergies  Allergen Reactions   Augmentin [Amoxicillin-Pot Clavulanate] Rash    Current Medications: Current Outpatient Medications  Medication Sig Dispense Refill   FLUoxetine (PROZAC) 20 MG capsule Take 3 capsules (60 mg total) by mouth daily. 90 capsule 2   guanFACINE (TENEX) 1 MG tablet Take 1 tablet (1 mg total) by mouth at bedtime. For ADHD 30 tablet 2   haloperidol decanoate (HALDOL DECANOATE) 100 MG/ML injection Inject 2 mLs (200 mg total) into the muscle every 30  (thirty) days. (Due on 02-12-22): For mood control 1 mL 11   hydrOXYzine (ATARAX) 25 MG tablet TAKE 1 TABLET (25 MG TOTAL) BY MOUTH THREE (THREE) TIMES DAILY AS NEEDED FOR ANXIETY 90 tablet 0   mirtazapine (REMERON) 30 MG tablet Take 1 tablet (30 mg total) by mouth at bedtime. 30 tablet 2   Oxcarbazepine (TRILEPTAL) 300 MG tablet Take 1 tablet (300 mg total) by mouth 2 (two) times daily. 60 tablet 2   traZODone (DESYREL) 50 MG tablet Take 1 tablet (50 mg total) by mouth at bedtime as needed for sleep. 30 tablet 2   No current facility-administered medications for this visit.     Psychiatric Specialty Exam: Review of Systems  There were no vitals taken for this visit.There is no height or weight on file to calculate BMI.  General Appearance: Fairly Groomed  Eye Contact:  Good  Speech:  Clear and Coherent and Normal Rate  Volume:  Normal  Mood:  Euthymic  Affect:  Appropriate and Blunt  Thought Process:  Coherent and Goal Directed  Orientation:  Full (Time, Place, and Person)  Thought Content: Logical   Suicidal Thoughts:  No  Homicidal Thoughts:  No  Memory:  Immediate;   Good  Judgement:  Good  Insight:  Fair  Psychomotor Activity:  Normal  Concentration:  Concentration: Good  Recall:  Good  Fund of Knowledge: Fair  Language: Good  Akathisia:  No    AIMS (if indicated): not done  Assets:  Communication Skills Desire for Improvement Financial Resources/Insurance Physical Health Resilience Talents/Skills Transportation Vocational/Educational  ADL's:  Intact  Cognition: WNL  Sleep:  Good   Metabolic Disorder Labs: Lab Results  Component Value Date   HGBA1C 4.6 (L) 01/04/2022   MPG 85.32 01/04/2022   No results found for: "PROLACTIN" Lab Results  Component Value Date   CHOL 148 01/04/2022   TRIG 104 01/04/2022   HDL 46 01/04/2022   CHOLHDL 3.2 01/04/2022   VLDL 21 01/04/2022   LDLCALC 81 01/04/2022   Lab Results  Component Value Date   TSH 1.942 01/04/2022     Therapeutic Level Labs: No results found for: "LITHIUM" No results found for: "VALPROATE" No results found for: "CBMZ"   Screenings: AIMS    Flowsheet Row [facility]  AIMS Total Score 0      AUDIT    Flowsheet Row Ad[facility]  Alcohol Use Disorder Identification Test Final Score (AUDIT) 0      GAD-7    Flowsheet Row [facility]  Total GAD-7 Score 3      PHQ2-9    Flowsheet Row Video Visit [facility]09/2023 [facility]  PHQ-2 Total Score 1      Flowsheet Row Video Visit [facility] 02/09/2023 [facility] Most recent reading at 02/09/2023 10:35 AM Ad[facility] (Discharged) [facility] Most recent reading at 01/02/2022  9:45 PM ED from 01/02/2022 in [facility] at [facility] Most recent reading at 01/02/2022  5:53 PM  C-SSRS RISK CATEGORY Low Risk High Risk High Risk       Collaboration of Care: Collaboration of Care: Medication Management AEB medication prescription  Patient/Guardian was advised Release of Information must be obtained prior to any record release in order to collaborate their care with an outside provider. Patient/Guardian was advised if they have not already done so to contact the registration department to sign all necessary forms in order for [place] to release information regarding their care.   Consent: Patient/Guardian gives verbal consent for treatment and assignment of benefits for services provided during this visit. Patient/Guardian expressed understanding and agreed to proceed.    Stasia Cavalier, MD 04/25/2023, 1:15 PM   Virtual Visit via Video Note  I connected with Doylene Canard on 04/25/23 at  1:00 PM EDT by a video enabled telemedicine application and verified that I am speaking with the correct person using two identifiers.  Location: Patient: Home Provider: Home Office   I discussed the limitations of evaluation and management by telemedicine and the availability of in person appointments. The patient expressed understanding and agreed to proceed.   I discussed the assessment and treatment plan with the patient. The patient was provided an opportunity to ask questions and all were answered. The patient agreed with the plan and demonstrated  an understanding of the instructions.   The patient was advised to call back or seek an in-person evaluation if the symptoms worsen or if the condition fails to improve as anticipated.  I provided 15 minutes of non-face-to-face time during this encounter.   Stasia Cavalier, MD

## 2023-04-26 LAB — B12 AND FOLATE PANEL
Folate: >20 ng/mL (ref >3.0)
Vitamin B-12: 1310 pg/mL — ABNORMAL HIGH (ref 232–1245)

## 2023-05-07 ENCOUNTER — Other Ambulatory Visit (HOSPITAL_COMMUNITY): Payer: Self-pay | Admitting: Psychiatry

## 2023-05-07 DIAGNOSIS — F251 Schizoaffective disorder, depressive type: Secondary | ICD-10-CM

## 2023-05-09 ENCOUNTER — Ambulatory Visit (HOSPITAL_BASED_OUTPATIENT_CLINIC_OR_DEPARTMENT_OTHER): Payer: Medicare Other | Admitting: *Deleted

## 2023-05-09 VITALS — BP 143/80 | HR 100 | Resp 20 | Ht 72.0 in | Wt 261.4 lb

## 2023-05-09 DIAGNOSIS — F251 Schizoaffective disorder, depressive type: Secondary | ICD-10-CM

## 2023-05-09 MED ORDER — HALOPERIDOL DECANOATE 100 MG/ML IM SOLN
200.0000 mg | Freq: Once | INTRAMUSCULAR | Status: AC
  Administered 2023-05-09: 200 mg via INTRAMUSCULAR

## 2023-05-09 NOTE — Patient Instructions (Addendum)
Pt in office today for due Haldol Decanoate 200 mg injection. Pt presents as anxious, nervous, fidgety with poor eye contact. Poor hygiene per usual presentation. Pt endorses almost constant AH which he describes as a "running commentary" of his life. Pt says that 60% of the time they are benign but 40% are derogatory or command. Primarily negative comments about pt and his life. Pt denies SI, HI presently. Pt says he is able to go get groceries when he needs them and eats ok. Pt enjoys his cat and playing video games. Grandmother lives close by and helps when she can. Injection prepared and given as ordered. Given in LD without complaint. Pt to return in approximately 28 days for next due injection.

## 2023-05-30 ENCOUNTER — Encounter (HOSPITAL_COMMUNITY): Payer: Medicare Other | Admitting: Psychiatry

## 2023-05-30 ENCOUNTER — Encounter (HOSPITAL_COMMUNITY): Payer: Self-pay

## 2023-05-30 NOTE — Progress Notes (Signed)
This encounter was created in error - please disregard.

## 2023-06-06 ENCOUNTER — Ambulatory Visit (HOSPITAL_COMMUNITY): Payer: Medicare Other

## 2023-06-07 ENCOUNTER — Ambulatory Visit (HOSPITAL_BASED_OUTPATIENT_CLINIC_OR_DEPARTMENT_OTHER): Payer: Medicare Other | Admitting: *Deleted

## 2023-06-07 VITALS — BP 126/78 | HR 79 | Resp 18 | Ht 72.0 in | Wt 261.2 lb

## 2023-06-07 DIAGNOSIS — F251 Schizoaffective disorder, depressive type: Secondary | ICD-10-CM | POA: Diagnosis not present

## 2023-06-07 MED ORDER — HALOPERIDOL DECANOATE 100 MG/ML IM SOLN
200.0000 mg | Freq: Once | INTRAMUSCULAR | Status: AC
  Administered 2023-06-07: 200 mg via INTRAMUSCULAR

## 2023-06-07 NOTE — Patient Instructions (Addendum)
Pt in office today for due Haldol Decanoate 200 mg injection. Pt presents with anxious mood and affect appropriate to mood. Pt is cooperative on approach. Eye contact minimal. Pt leg fidgety per usual. Pt says he thinks the Prozac is helping. Hygiene improved from previous visits. 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 Pt see GM and mother regularly as support. Pt continues to endorse AH as "a running commentary" on his life. Denies SI and HI. No visual hallucinations reported. Injection prepared and given as ordered. Injection was administered in RD without complaint. Writer reinforced that injection can be given in [place] and the importance of rotating sites particularly as the Decanoate is highly viscous. Pt did not respond. Pt scheduled a f/u with Dr. Mercy Riding as he was checking out. Miguel Shea is to return in approximately 4 weeks for next due injection.

## 2023-06-11 ENCOUNTER — Other Ambulatory Visit (HOSPITAL_COMMUNITY): Payer: Self-pay | Admitting: Psychiatry

## 2023-06-11 DIAGNOSIS — F251 Schizoaffective disorder, depressive type: Secondary | ICD-10-CM

## 2023-06-12 ENCOUNTER — Encounter (HOSPITAL_COMMUNITY): Payer: Self-pay | Admitting: Psychiatry

## 2023-06-12 ENCOUNTER — Telehealth (HOSPITAL_BASED_OUTPATIENT_CLINIC_OR_DEPARTMENT_OTHER): Payer: Medicare Other | Admitting: Psychiatry

## 2023-06-12 DIAGNOSIS — F251 Schizoaffective disorder, depressive type: Secondary | ICD-10-CM | POA: Diagnosis not present

## 2023-06-12 DIAGNOSIS — Z79899 Other long term (current) drug therapy: Secondary | ICD-10-CM

## 2023-06-12 MED ORDER — TRAZODONE HCL 50 MG PO TABS
50.0000 mg | ORAL_TABLET | Freq: Every evening | ORAL | 2 refills | Status: DC | PRN
Start: 2023-06-12 — End: 2023-07-25

## 2023-06-12 MED ORDER — OXCARBAZEPINE 300 MG PO TABS: 300.0000 mg | ORAL_TABLET | Freq: Two times a day (BID) | ORAL | 2 refills | Status: DC

## 2023-06-12 MED ORDER — MIRTAZAPINE 30 MG PO TABS
30.0000 mg | ORAL_TABLET | Freq: Every day | ORAL | 2 refills | Status: DC
Start: 2023-06-12 — End: 2023-07-25

## 2023-06-12 MED ORDER — HALOPERIDOL DECANOATE 100 MG/ML IM SOLN
200.0000 mg | INTRAMUSCULAR | 11 refills | Status: DC
Start: 2023-06-12 — End: 2023-09-04

## 2023-06-12 MED ORDER — FLUOXETINE HCL 20 MG PO CAPS
60.0000 mg | ORAL_CAPSULE | Freq: Every day | ORAL | 2 refills | Status: DC
Start: 2023-06-12 — End: 2023-07-25

## 2023-06-12 MED ORDER — GUANFACINE HCL 2 MG PO TABS: 2.0000 mg | ORAL_TABLET | Freq: Every day | ORAL | 2 refills | Status: DC

## 2023-06-12 NOTE — Progress Notes (Signed)
BH MD/PA/NP OP Progress Note  06/12/2023 12:49 PM Miguel Shea  MRN:  [id number]  Visit Diagnosis:    ICD-10-CM   1. Encounter for long-term (current) use of medications  Z79.899     2. Schizoaffective disorder, depressive type (HCC)  F25.1 FLUoxetine (PROZAC) 20 MG capsule    guanFACINE (TENEX) 2 MG tablet    haloperidol decanoate (HALDOL DECANOATE) 100 MG/ML injection    mirtazapine (REMERON) 30 MG tablet    Oxcarbazepine (TRILEPTAL) 300 MG tablet    traZODone (DESYREL) 50 MG tablet       Assessment: Miguel Shea is a 27 y.o. male with a history of schizoaffective disorder and reported autism spectrum disorder who presented to [facility] for initial evaluation on 02/09/2023.  At initial evaluation patient reported symptoms of visual hallucinations, paranoia, negative symptoms of schizophrenia, increased irritability, and auditory hallucinations which are command in nature and tell him to hurt/kill himself at times.  Patient reported that these are less intrusive than in the past and easily ignored when on the Haldol Decanoate and other antipsychotic medications.  Prior to the last month he had felt they have been managed solely by Haldol Decanoate and it was until the last week that he felt the need to restart oral antipsychotics as well.  Patient reported symptoms of lipsmacking and eye twitching which may be consistent with tardive dyskinesia.  Discussed the risk of being on multiple antipsychotics in addition to long-acting injectable.  Patient met criteria for schizoaffective disorder.   Micael Lezon presents for follow-up evaluation. Today, 06/12/23, patient reports that his mood has been stable over the past month.  He can experience auditory and visual hallucinations which occur more frequently the last week of his injection.  Patient has been taking oral Haldol during this time.  While this would be appropriate as this happens  consistently we will instead increase the Haldol frequency to every 3- 3.5 weeks.  Risk and benefits were discussed.  Patient has also had some increase in attention which was more notable after starting his new job.  We will increase guanfacine to 2 mg daily.  Patient will follow-up in a month and is due for his next injection on 07/02/2023.  We will plan for patient to be fasting at that time so he can get lipid and A1c drawn.  Plan: - Change Haldol Decanoate 200 mg IM to every 3 weeks, last on 06/07/23, next due on 07/02/23 - Continue Trileptal 300 mg BID - Continue Prozac 60 mg QD - Continue Remeron 30 mg QHS - Continue Trazodone 50 mg QD - Continue Atarax 25 mg TID prn for anxiety - Increase guanfacine 2 mg QD - CBC, CMP, lipid profile, A1c, TSH reviewed - Recheck Lipid panel, and A1c - CT head w/o contrast reviewed - Crisis resources reviewed - Follow up in a month   Chief Complaint:  Chief Complaint  Patient presents with   Follow-up   HPI: Miguel Shea presents reporting that he has been doing all right.  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  He has enjoyed the job and getting out of the house.  Patient notes that his mood has been more stable since increasing Trileptal and he has been doing well to handle his stress.  Patient's main concerns at this time are his attention and hallucinations.  Patient reports that he has noticed some increase in inattention since starting work.  He was interested in  increasing guanfacine as it has helped with focus in the past.  We will increase guanfacine to 2 mg a day and reviewed the risk and benefits.  Also of note patient has been having an increase in auditory/visual hallucinations the last week of a month.  The auditory hallucinations can be like an internal monologue or music in his head.  While the visual hallucinations are flashing lights.  It appears that the Haldol injection is wearing off around 5 days  before he receives his next one.  Miguel Shea has been taking oral Haldol during this time but does not always know how many days it is.  We discussed options including increasing the dose, split dosing, or changing the frequency.  Patient was hesitant to increase the Haldol and dose any further.  Instead we will change the frequency to every 24 to 26 days.  Risk and benefits were discussed.  Past Psychiatric History:  Patient has multiple past hospitalizations last one in January 2023. No prior suicide attempts  Patient has tried lithium (over sedating), Depakote (leg swelling), Lamictal (irritability), Tegretol, Seroquel, Haldol, Zyprexa, Risperdal, Prozac, Remeron, Atarax, guanfacine, propranolol, and trazodone.  Denies any substance use  Past Medical History:  Past Medical History:  Diagnosis Date   Bipolar 1 disorder (HCC)    Wolff-Parkinson-White syndrome    No past surgical history on file.  Family History: No family history on file.  Social History:  Social History   Socioeconomic History   Marital status: Single    Spouse name: Not on file   Number of children: Not on file   Years of education: Not on file   Highest education level: Not on file  Occupational History   Not on file  Tobacco Use   Smoking status: Never   Smokeless tobacco: Never  Vaping Use   Vaping Use: Some days   Substances: Nicotine, Flavoring  Substance and Sexual Activity   Alcohol use: Never   Drug use: Never   Sexual activity: Not on file  Other Topics Concern   Not on file  Social History Narrative   ** Merged History Encounter **       Social Determinants of Health   Financial Resource Strain: Not on file  Food Insecurity: Not on file  Transportation Needs: Not on file  Physical Activity: Not on file  Stress: Not on file  Social Connections: Not on file    Allergies:  Allergies  Allergen Reactions   Augmentin [Amoxicillin-Pot Clavulanate] Rash    Current Medications: Current  Outpatient Medications  Medication Sig Dispense Refill   FLUoxetine (PROZAC) 20 MG capsule Take 3 capsules (60 mg total) by mouth daily. 90 capsule 2   guanFACINE (TENEX) 2 MG tablet Take 1 tablet (2 mg total) by mouth at bedtime. For ADHD 30 tablet 2   haloperidol decanoate (HALDOL DECANOATE) 100 MG/ML injection Inject 2 mLs (200 mg total) into the muscle every 28 (twenty-eight) days. Take every 24-26 days, wears off around week 3.5 1 mL 11   hydrOXYzine (ATARAX) 25 MG tablet TAKE 1 TABLET (25 MG TOTAL) BY MOUTH THREE (THREE) TIMES DAILY AS NEEDED FOR ANXIETY 90 tablet 0   mirtazapine (REMERON) 30 MG tablet Take 1 tablet (30 mg total) by mouth at bedtime. 30 tablet 2   Oxcarbazepine (TRILEPTAL) 300 MG tablet Take 1 tablet (300 mg total) by mouth 2 (two) times daily. 60 tablet 2   traZODone (DESYREL) 50 MG tablet Take 1 tablet (50 mg total) by mouth at  bedtime as needed for sleep. 30 tablet 2   No current facility-administered medications for this visit.     Psychiatric Specialty Exam: Review of Systems  There were no vitals taken for this visit.There is no height or weight on file to calculate BMI.  General Appearance: Fairly Groomed  Eye Contact:  Good  Speech:  Clear and Coherent and Normal Rate  Volume:  Normal  Mood:  Euthymic  Affect:  Appropriate and Blunt  Thought Process:  Coherent and Goal Directed  Orientation:  Full (Time, Place, and Person)  Thought Content: Logical and Hallucinations: Auditory Visual Occur towards the end of the month as the injection wears off    Suicidal Thoughts:  No  Homicidal Thoughts:  No  Memory:  Immediate;   Good  Judgement:  Good  Insight:  Fair  Psychomotor Activity:  Normal  Concentration:  Concentration: Fair  Recall:  Good  Fund of Knowledge: Fair  Language: Good  Akathisia:  No    AIMS (if indicated): not done  Assets:  Communication Skills Desire for Improvement Financial Resources/Insurance Physical  Health Resilience Talents/Skills Transportation Vocational/Educational  ADL's:  Intact  Cognition: WNL  Sleep:  Good   Metabolic Disorder Labs: Lab Results  Component Value Date   HGBA1C 4.6 (L) 01/04/2022   MPG 85.32 01/04/2022   No results found for: "PROLACTIN" Lab Results  Component Value Date   CHOL 148 01/04/2022   TRIG 104 01/04/2022   HDL 46 01/04/2022   CHOLHDL 3.2 01/04/2022   VLDL 21 01/04/2022   LDLCALC 81 01/04/2022   Lab Results  Component Value Date   TSH 1.942 01/04/2022    Therapeutic Level Labs: No results found for: "LITHIUM" No results found for: "VALPROATE" No results found for: "CBMZ"   Screenings: AIMS    Flowsheet Row [facility]  AIMS Total Score 0      AUDIT    Flowsheet Row Ad[facility] ([facility]charged[facility]  Alcohol Use Disorder Identification Test Final Score (AUDIT) 0      GAD-7    Flowsheet Row Video [facility]  Total GAD-7 Score 3      PHQ2-9    Flowsheet Row Video Visit [facility] 02/09/2023 [facility]  PHQ-2 Total Score 1      Flowsheet Row Video Visit from 02/09/2023 in [facility] Most recent reading at 02/09/2023 10:35 AM Ad[facility] (Discharged) [facility] Most recent reading at 01/02/2022  9:45 PM ED from 01/02/2022 in [facility] Most recent reading at 01/02/2022  5:53 PM  C-SSRS RISK CATEGORY Low Risk High Risk High Risk       Collaboration of Care: Collaboration of Care: Medication Management AEB medication prescription  Patient/Guardian was advised Release of Information must be obtained prior to any record release in order to collaborate their care  with an outside provider. Patient/Guardian was advised if they have not already done so to contact the registration department to sign all necessary forms in order for [place] to release information regarding their care.   Consent: Patient/Guardian gives verbal consent for treatment and assignment of benefits for services provided during this visit. Patient/Guardian expressed understanding and agreed to proceed.    Stasia Cavalier, MD 06/12/2023, 12:49 PM   Virtual Visit via Video  Note  I connected with Doylene Canard on 06/12/23 at 11:00 AM EDT by a video enabled telemedicine application and verified that I am speaking with the correct person using two identifiers.  Location: Patient: Home Provider: Home Office   I discussed the limitations of evaluation and management by telemedicine and the availability of in person appointments. The patient expressed understanding and agreed to proceed.   I discussed the assessment and treatment plan with the patient. The patient was provided an opportunity to ask questions and all were answered. The patient agreed with the plan and demonstrated an understanding of the instructions.   The patient was advised to call back or seek an in-person evaluation if the symptoms worsen or if the condition fails to improve as anticipated.  I provided 15 minutes of non-face-to-face time during this encounter.   Stasia Cavalier, MD

## 2023-07-02 ENCOUNTER — Ambulatory Visit (HOSPITAL_COMMUNITY): Payer: Medicare Other

## 2023-07-02 ENCOUNTER — Encounter (HOSPITAL_COMMUNITY): Payer: Self-pay

## 2023-07-08 ENCOUNTER — Other Ambulatory Visit (HOSPITAL_COMMUNITY): Payer: Self-pay | Admitting: Psychiatry

## 2023-07-08 ENCOUNTER — Ambulatory Visit (HOSPITAL_COMMUNITY): Payer: Medicare Other

## 2023-07-08 DIAGNOSIS — F251 Schizoaffective disorder, depressive type: Secondary | ICD-10-CM

## 2023-07-24 NOTE — Progress Notes (Unsigned)
BH MD/PA/NP OP Progress Note  07/25/2023 2:57 PM Miguel Shea  MRN:  [id number]  Visit Diagnosis:    ICD-10-CM   1. Schizoaffective disorder, depressive type (HCC)  F25.1 QUEtiapine (SEROQUEL XR) 300 MG 24 hr tablet    haloperidol (HALDOL) 5 MG tablet    Oxcarbazepine (TRILEPTAL) 300 MG tablet    FLUoxetine (PROZAC) 20 MG capsule    guanFACINE (TENEX) 2 MG tablet    mirtazapine (REMERON) 30 MG tablet    traZODone (DESYREL) 50 MG tablet    2. Autism spectrum disorder  F84.0         Assessment: Miguel Shea is a 27 y.o. male with a history of schizoaffective disorder and reported autism spectrum disorder who presented to [facility] at [facility] for initial evaluation on 02/09/2023.  At initial evaluation patient reported symptoms of visual hallucinations, paranoia, negative symptoms of schizophrenia, increased irritability, and auditory hallucinations which are command in nature and tell him to hurt/kill himself at times.  Patient reported that these are less intrusive than in the past and easily ignored when on the Haldol Decanoate or other antipsychotic medications.  Patient reported symptoms of lipsmacking and eye twitching which may be consistent with tardive dyskinesia.  Discussed the risk of being on multiple antipsychotics in addition to long-acting injectable.  Patient met criteria for schizoaffective disorder. He also carried a past dx of autism.   Miguel Shea presents for follow-up evaluation. Today, 07/25/23, patient reports being stable over the past month and a half.  Of note patient uses injection scheduled for 7/2 and has been taking oral Haldol 15 mg instead.  He denies any increase in psychosis or increase in paranoia or delusions.  Patient is interested in switching to oral medications going forward.  We did review his past history and why he is on injectable medications however recently compliance has not been an issue.  This can  be evidenced by the fact he has continued to take oral medications despite missing the injection earlier this month.  Patient had also been having issues with the serum level of the injection following off towards the end of the month.  We will start patient on Seroquel extended release 300 mg which we will increase to 600 mg in a week.  He will also continue today Haldol 5 mg for the next week before discontinuing.  Risk and benefits of this were reviewed and patient will continue to monitor his mood, paranoia, and psychosis symptoms for any potential increase.  He is aware of the need for blood work yearly while on Seroquel.  Plan: - Discontinue Haldol Decanoate 200 mg IM  - Start Haldol 5 mg at bedtime for 7 days before discontinuing - Start Seroquel XR 300 mg before increasing to 600 mg QD in 7 days - Continue Trileptal 300 mg BID - Continue Prozac 60 mg QD - Continue Remeron 30 mg QHS - Continue Trazodone 50 mg QD - Continue Atarax 25 mg TID prn for anxiety - Increase guanfacine 2 mg QD - CBC, CMP, lipid profile, A1c, TSH reviewed - Recheck Lipid panel, and A1c - CT head w/o contrast reviewed - Crisis resources reviewed - Follow up in a month   Chief Complaint:  Chief Complaint  Patient presents with   Follow-up   HPI: Miguel Shea presents reporting  he missed the injection appointment on 7/2 but he has been taking haldol 15 mg at night instead. He denies any change/increase in his hallucinations since this time. He  will still occasionally see colorful dots in addition to have some difficulty processing things like social interactions.  He denies any paranoia or delusions.  At this point Miguel Shea feels like he prefer to continue on oral medication as opposed to going back to the injectable.  He finds coming to the office each month with the injectable medication to be more difficult and also has had issues with the medication becoming less effective towards the end of the month.  In addition  to this patient was interested in trying to switch back to Seroquel as his antipsychotic which does not, injectable form.  Patient notes that he was on this in the past with good control of his symptoms but believes it was stopped due to either weight gain or sedation.  We did discuss the risk of both weight gain and sedation with him today if she is aware of.  Patient actually is interested in the sedation hoping that will help him sleep a bit better.  In part this is due to the night terrors experience and we did review how Seroquel would be unlikely to affect the night terrors.  Upon review of the patient's history he does appear to have been controlled on oral medications in the past and injectables was just due to 1 hospitalization where he did not want to take oral medication.  As he has an intake or medications despite missing his injection it does seem appropriate to switch back to oral medication at this time.  We agreed to start Seroquel extended release 300 mg before titrating up to 600 mg.  Patient will taper off of Haldol at the same time.  Risk and benefits of this were discussed.  He will also monitor for any change in hallucinations, paranoia, or delusions.  Other than this patient notes that his job has finished.  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  Patient is all right with this noting that if he were to take another job.  We will make sure that it is a good fit first.  Past Psychiatric History:  Patient has multiple past hospitalizations last one in January 2023. No prior suicide attempts  Patient has tried lithium (over sedating), Depakote (leg swelling), Lamictal (irritability), Tegretol, Seroquel, Haldol, Zyprexa, Risperdal, Prozac, Remeron, Atarax, guanfacine, propranolol, and trazodone.  Denies any substance use  Past Medical History:  Past Medical History:  Diagnosis Date   Bipolar 1 disorder  (HCC)    Wolff-Parkinson-White syndrome    No past surgical history on file.  Family History: No family history on file.  Social History:  Social History   Socioeconomic History   Marital status: Single    Spouse name: Not on file   Number of children: Not on file   Years of education: Not on file   Highest education level: Not on file  Occupational History   Not on file  Tobacco Use   Smoking status: Never   Smokeless tobacco: Never  Vaping Use   Vaping status: Some Days   Substances: Nicotine, Flavoring  Substance and Sexual Activity   Alcohol use: Never   Drug use: Never   Sexual activity: Not on file  Other Topics Concern   Not on file  Social History Narrative   ** Merged History Encounter **       Social Determinants of Health   Financial Resource Strain: Not on file  Food Insecurity:  Not on file  Transportation Needs: Not on file  Physical Activity: Not on file  Stress: Not on file  Social Connections: Not on file    Allergies:  Allergies  Allergen Reactions   Augmentin [Amoxicillin-Pot Clavulanate] Rash    Current Medications: Current Outpatient Medications  Medication Sig Dispense Refill   haloperidol (HALDOL) 5 MG tablet Take 1 tablet (5 mg total) by mouth daily. Take for 7 days with Seroquel XR 300 mg before stopping 7 tablet 0   QUEtiapine (SEROQUEL XR) 300 MG 24 hr tablet Take 1 tablet (300 mg total) by mouth at bedtime for 7 days, THEN 2 tablets (600 mg total) at bedtime for 23 days. 60 tablet 2   FLUoxetine (PROZAC) 20 MG capsule Take 3 capsules (60 mg total) by mouth daily. 90 capsule 2   guanFACINE (TENEX) 2 MG tablet Take 1 tablet (2 mg total) by mouth at bedtime. For ADHD 30 tablet 2   haloperidol decanoate (HALDOL DECANOATE) 100 MG/ML injection Inject 2 mLs (200 mg total) into the muscle every 28 (twenty-eight) days. Take every 24-26 days, wears off around week 3.5 1 mL 11   hydrOXYzine (ATARAX) 25 MG tablet TAKE 1 TABLET (25 MG TOTAL) BY  MOUTH THREE (THREE) TIMES DAILY AS NEEDED FOR ANXIETY 90 tablet 1   mirtazapine (REMERON) 30 MG tablet Take 1 tablet (30 mg total) by mouth at bedtime. 30 tablet 2   Oxcarbazepine (TRILEPTAL) 300 MG tablet Take 1 tablet (300 mg total) by mouth 2 (two) times daily. 60 tablet 2   traZODone (DESYREL) 50 MG tablet Take 1 tablet (50 mg total) by mouth at bedtime as needed for sleep. 30 tablet 2   No current facility-administered medications for this visit.     Psychiatric Specialty Exam: Review of Systems  There were no vitals taken for this visit.There is no height or weight on file to calculate BMI.  General Appearance: Fairly Groomed  Eye Contact:  Good  Speech:  Clear and Coherent and Normal Rate  Volume:  Normal  Mood:  Euthymic  Affect:  Appropriate and Blunt  Thought Process:  Coherent and Goal Directed  Orientation:  Full (Time, Place, and Person)  Thought Content: Logical and Hallucinations: Visual Balls of light    Suicidal Thoughts:  No  Homicidal Thoughts:  No  Memory:  Immediate;   Good  Judgement:  Fair  Insight:  Fair  Psychomotor Activity:  Normal  Concentration:  Concentration: Fair  Recall:  Good  Fund of Knowledge: Fair  Language: Good  Akathisia:  No    AIMS (if indicated): not done  Assets:  Communication Skills Desire for Improvement Financial Resources/Insurance Physical Health Resilience Talents/Skills Transportation Vocational/Educational  ADL's:  Intact  Cognition: WNL  Sleep:  Good   Metabolic Disorder Labs: Lab Results  Component Value Date   HGBA1C 4.6 (L) 01/04/2022   MPG 85.32 01/04/2022   No results found for: "PROLACTIN" Lab Results  Component Value Date   CHOL 148 01/04/2022   TRIG 104 01/04/2022   HDL 46 01/04/2022   CHOLHDL 3.2 01/04/2022   VLDL 21 01/04/2022   LDLCALC 81 01/04/2022   Lab Results  Component Value Date   TSH 1.942 01/04/2022    Therapeutic Level Labs: No results found for: "LITHIUM" No results found  for: "VALPROATE" No results found for: "CBMZ"   Screenings: AIMS    Flowsheet Row [facility]  AIMS Total Score 0  AUDIT    Flowsheet Row Admission (Discharged[facility]  Alcohol Use Disorder Identification Test Final Score (AUDIT) 0      GAD-7    Flowsheet Row Video [facility]  Total GAD-7 Score 3      PHQ2-9    Flowsheet Row Video Visit from 02[facility]09/2023 [facility]  PHQ-2 Total Score 1      Flowsheet Row Video Visit from 02/09/2023 [facility] Most recent reading at 02/09/2023 10:35 AM Ad[facility] (Discharged) [facility] Most recent reading at 01/02/2022  9:45 PM ED from 01/02/2022 in [facility] Most recent reading at 01/02/2022  5:53 PM  C-SSRS RISK CATEGORY Low Risk High Risk High Risk       Collaboration of Care: Collaboration of Care: Medication Management AEB medication prescription  Patient/Guardian was advised Release of Information must be obtained prior to any record release in order to collaborate their care with an outside provider. Patient/Guardian was advised if they have not already done so to contact the registration department to sign all necessary forms in order for [place] to release information regarding their care.   Consent: Patient/Guardian gives verbal consent for treatment and assignment of benefits for services provided during this visit. Patient/Guardian expressed understanding and agreed to proceed.    Stasia Cavalier, MD 07/25/2023, 2:57 PM   Virtual Visit via Video Note  I connected with Miguel Shea on 07/25/23 at  2:00 PM EDT by a video enabled telemedicine application  and verified that I am speaking with the correct person using two identifiers.  Location: Patient: Home Provider: Home Office   I discussed the limitations of evaluation and management by telemedicine and the availability of in person appointments. The patient expressed understanding and agreed to proceed.   I discussed the assessment and treatment plan with the patient. The patient was provided an opportunity to ask questions and all were answered. The patient agreed with the plan and demonstrated an understanding of the instructions.   The patient was advised to call back or seek an in-person evaluation if the symptoms worsen or if the condition fails to improve as anticipated.  I provided 25 minutes of non-face-to-face time during this encounter.   Stasia Cavalier, MD

## 2023-07-25 ENCOUNTER — Encounter (HOSPITAL_COMMUNITY): Payer: Self-pay | Admitting: Psychiatry

## 2023-07-25 ENCOUNTER — Telehealth (HOSPITAL_BASED_OUTPATIENT_CLINIC_OR_DEPARTMENT_OTHER): Payer: Medicare Other | Admitting: Psychiatry

## 2023-07-25 DIAGNOSIS — F840 Autistic disorder: Secondary | ICD-10-CM | POA: Diagnosis not present

## 2023-07-25 DIAGNOSIS — F251 Schizoaffective disorder, depressive type: Secondary | ICD-10-CM

## 2023-07-25 MED ORDER — MIRTAZAPINE 30 MG PO TABS
30.0000 mg | ORAL_TABLET | Freq: Every day | ORAL | 2 refills | Status: DC
Start: 2023-07-25 — End: 2023-09-04

## 2023-07-25 MED ORDER — QUETIAPINE FUMARATE ER 300 MG PO TB24
ORAL_TABLET | ORAL | 2 refills | Status: DC
Start: 2023-07-25 — End: 2023-09-04

## 2023-07-25 MED ORDER — HALOPERIDOL 5 MG PO TABS
5.0000 mg | ORAL_TABLET | Freq: Every day | ORAL | 0 refills | Status: DC
Start: 2023-07-25 — End: 2023-09-04

## 2023-07-25 MED ORDER — GUANFACINE HCL 2 MG PO TABS: 2.0000 mg | ORAL_TABLET | Freq: Every day | ORAL | 2 refills | Status: DC

## 2023-07-25 MED ORDER — TRAZODONE HCL 50 MG PO TABS
50.0000 mg | ORAL_TABLET | Freq: Every evening | ORAL | 2 refills | Status: DC | PRN
Start: 2023-07-25 — End: 2023-09-04

## 2023-07-25 MED ORDER — OXCARBAZEPINE 300 MG PO TABS
300.0000 mg | ORAL_TABLET | Freq: Two times a day (BID) | ORAL | 2 refills | Status: DC
Start: 2023-07-25 — End: 2023-09-04

## 2023-07-25 MED ORDER — FLUOXETINE HCL 20 MG PO CAPS
60.0000 mg | ORAL_CAPSULE | Freq: Every day | ORAL | 2 refills | Status: DC
Start: 2023-07-25 — End: 2023-09-04

## 2023-09-03 NOTE — Progress Notes (Signed)
BH MD/PA/NP OP Progress Note  09/04/2023 2:59 PM Miguel Shea  MRN:  [id number]  Visit Diagnosis:    ICD-10-CM   1. Schizoaffective disorder, depressive type (HCC)  F25.1 FLUoxetine (PROZAC) 20 MG capsule    guanFACINE (TENEX) 2 MG tablet    hydrOXYzine (ATARAX) 25 MG tablet    mirtazapine (REMERON) 30 MG tablet    Oxcarbazepine (TRILEPTAL) 300 MG tablet    traZODone (DESYREL) 50 MG tablet    QUEtiapine (SEROQUEL XR) 400 MG 24 hr tablet    2. Autism spectrum disorder  F84.0     3. Vitamin D deficiency, unspecified  E55.9          Assessment: Miguel Shea is a 27 y.o. male with a history of schizoaffective disorder and reported autism spectrum disorder who presented to [facility] at [facility] for initial evaluation on 02/09/2023.  At initial evaluation patient reported symptoms of visual hallucinations, paranoia, negative symptoms of schizophrenia, increased irritability, and auditory hallucinations which are command in nature and tell him to hurt/kill himself at times.  Patient reported that these are less intrusive than in the past and easily ignored when on the Haldol Decanoate or other antipsychotic medications.  Patient reported symptoms of lipsmacking and eye twitching which may be consistent with tardive dyskinesia.  Discussed the risk of being on multiple antipsychotics in addition to long-acting injectable.  Patient met criteria for schizoaffective disorder. He also carried a past dx of autism.   Miguel Shea presents for follow-up evaluation. Today, 09/04/23, patient reports some increase in mood lability and paranoia with the transition off of Haldol and onto Seroquel however that has improved as Seroquel has stabilized.  Currently he feels these are better controlled than they had been in the past though still not completely resolved.  Patient tolerated the Seroquel well and notes that excess eye secretions has been the only notable  side effect.  He denies any significant concern related to this and denies any increased salivation or sweating.  We will increase Seroquel XR to 800 mg a day and reviewed the risk and benefits.  We will continue on the remainder of his current medication regimen and patient will follow up in a month and a half.  Plan: - Discontinued Haldol - Increase Seroquel XR 800 mg QD  - Continue Trileptal 300 mg BID - Continue Prozac 60 mg QD - Continue Remeron 30 mg QHS - Continue Trazodone 50 mg QD - Continue Atarax 25 mg TID prn for anxiety - Continue guanfacine 2 mg QD - CBC, CMP, lipid profile, A1c, TSH reviewed - Recheck Lipid panel, and A1c - CT head w/o contrast reviewed - Crisis resources reviewed - Follow up in a month   Chief Complaint:  Chief Complaint  Patient presents with   Follow-up   HPI: Miguel Shea presents reporting that he is doing alright.  He reports taking his medications consistently over the past month.  He noticed with the transition off of Haldol and onto Seroquel there was a period where his mood was more labile.  Patient described having some increased depression, anxiety, agitation, and paranoia which has since improved.  He does still have some increased anxiety but overall feels like his symptoms are better on the Seroquel than they had been on the Haldol.  Patient feels this is in part due to the improved sleep that he is getting since starting Seroquel.  Currently he says these symptoms are about a 4-5/10 and they are manageable through his  coping techniques.  He is still interested in further medication adjustments to see if this could improve further though.  We discussed options to help with the mood lability and anxiety.  Patient was open to titrating Seroquel to 800 mg and risk and benefits were reviewed.  The only side effect he has found is potential increase in secretions in his eyes however he is not find this to be problematic.  He denies any excess salivation or  change in sweating.  Of note patient reports having lost weight with the transition to Seroquel.  This could be in part due to him trying to improve his diet.  Past Psychiatric History:  Patient has multiple past hospitalizations last one in January 2023. No prior suicide attempts  Patient has tried lithium (over sedating), Depakote (leg swelling), Lamictal (irritability), Tegretol, Trileptal, Seroquel, Haldol dec (200 mg Q3.5 weeks), Zyprexa, Risperdal, Prozac, Remeron, Atarax, guanfacine, propranolol, and trazodone.  Denies any substance use  Past Medical History:  Past Medical History:  Diagnosis Date   Bipolar 1 disorder (HCC)    Wolff-Parkinson-White syndrome    No past surgical history on file.  Family History: No family history on file.  Social History:  Social History   Socioeconomic History   Marital status: Single    Spouse name: Not on file   Number of children: Not on file   Years of education: Not on file   Highest education level: Not on file  Occupational History   Not on file  Tobacco Use   Smoking status: Never   Smokeless tobacco: Never  Vaping Use   Vaping status: Some Days   Substances: Nicotine, Flavoring  Substance and Sexual Activity   Alcohol use: Never   Drug use: Never   Sexual activity: Not on file  Other Topics Concern   Not on file  Social History Narrative   ** Merged History Encounter **       Social Determinants of Health   Financial Resource Strain: Not on file  Food Insecurity: Not on file  Transportation Needs: Not on file  Physical Activity: Not on file  Stress: Not on file  Social Connections: Not on file    Allergies:  Allergies  Allergen Reactions   Augmentin [Amoxicillin-Pot Clavulanate] Rash    Current Medications: Current Outpatient Medications  Medication Sig Dispense Refill   FLUoxetine (PROZAC) 20 MG capsule Take 3 capsules (60 mg total) by mouth daily. 90 capsule 2   guanFACINE (TENEX) 2 MG tablet Take 1  tablet (2 mg total) by mouth at bedtime. For ADHD 30 tablet 2   hydrOXYzine (ATARAX) 25 MG tablet Take 1 tablet (25 mg total) by mouth 3 (three) times daily as needed. 90 tablet 1   mirtazapine (REMERON) 30 MG tablet Take 1 tablet (30 mg total) by mouth at bedtime. 30 tablet 2   Oxcarbazepine (TRILEPTAL) 300 MG tablet Take 1 tablet (300 mg total) by mouth 2 (two) times daily. 60 tablet 2   QUEtiapine (SEROQUEL XR) 400 MG 24 hr tablet Take 2 tablets (800 mg total) by mouth at bedtime. 60 tablet 2   traZODone (DESYREL) 50 MG tablet Take 1 tablet (50 mg total) by mouth at bedtime as needed for sleep. 30 tablet 2   No current facility-administered medications for this visit.     Psychiatric Specialty Exam: Review of Systems  There were no vitals taken for this visit.There is no height or weight on file to calculate BMI.  General Appearance: Fairly Groomed  Eye Contact:  Good  Speech:  Clear and Coherent and Normal Rate  Volume:  Normal  Mood:  Euthymic  Affect:  Appropriate and Blunt  Thought Process:  Coherent and Goal Directed  Orientation:  Full (Time, Place, and Person)  Thought Content: Logical and Hallucinations: Visual Balls of light    Suicidal Thoughts:  No  Homicidal Thoughts:  No  Memory:  Immediate;   Good  Judgement:  Fair  Insight:  Fair  Psychomotor Activity:  Normal  Concentration:  Concentration: Fair  Recall:  Good  Fund of Knowledge: Fair  Language: Good  Akathisia:  No    AIMS (if indicated): not done  Assets:  Communication Skills Desire for Improvement Financial Resources/Insurance Physical Health Resilience Talents/Skills Transportation Vocational/Educational  ADL's:  Intact  Cognition: WNL  Sleep:  Good   Metabolic Disorder Labs: Lab Results  Component Value Date   HGBA1C 4.6 (L) 01/04/2022   MPG 85.32 01/04/2022   No results found for: "PROLACTIN" Lab Results  Component Value Date   CHOL 148 01/04/2022   TRIG 104 01/04/2022   HDL 46  01/04/2022   CHOLHDL 3.2 01/04/2022   VLDL 21 01/04/2022   LDLCALC 81 01/04/2022   Lab Results  Component Value Date   TSH 1.942 01/04/2022    Therapeutic Level Labs: No results found for: "LITHIUM" No results found for: "VALPROATE" No results found for: "CBMZ"   Screenings: AIMS    Flowsheet Row [facility]  AIMS Total Score 0      AUDIT    Flowsheet Row Admission (Discharged[facility]  Alcohol Use Disorder Identification Test Final Score (AUDIT) 0      GAD-7    Flowsheet Row Video Visit [facility]09/2023 [facility]  Total GAD-7 Score 3      PHQ2-9    Flowsheet Row Video Visit from 02/09/2023 in [facility]  PHQ-2 Total Score 1      Flowsheet Row Video Visit from 02/09/2023 in [facility] Most recent reading at 02/09/2023 10:35 AM Admission (Discharged) [facility] Most recent reading at 01/02/2022  9:45 PM ED from 01/02/2022 in [facility] Department at [facility] Most recent reading at 01/02/2022  5:53 PM  C-SSRS RISK CATEGORY Low Risk High Risk High Risk       Collaboration of Care: Collaboration of Care: Medication Management AEB medication prescription  Patient/Guardian was advised Release of Information must be obtained prior to any record release in order to collaborate their care with an outside provider. Patient/Guardian was advised if they have not already done so to contact the registration department to sign all necessary forms in order for [place] to release information regarding their care.   Consent: Patient/Guardian gives verbal consent for treatment and assignment of benefits for services provided during this visit. Patient/Guardian  expressed understanding and agreed to proceed.    Stasia Cavalier, MD 09/04/2023, 2:59 PM   Virtual Visit via Video Note  I connected with Doylene Canard on 09/04/23 at  2:00 PM EDT by a video enabled telemedicine application and verified that I am speaking with the correct person using two identifiers.  Location: Patient: Home Provider: Home Office   I discussed the limitations of evaluation and management by telemedicine and the availability of in person appointments. The patient expressed  understanding and agreed to proceed.   I discussed the assessment and treatment plan with the patient. The patient was provided an opportunity to ask questions and all were answered. The patient agreed with the plan and demonstrated an understanding of the instructions.   The patient was advised to call back or seek an in-person evaluation if the symptoms worsen or if the condition fails to improve as anticipated.  I provided 20 minutes of non-face-to-face time during this encounter.   Stasia Cavalier, MD

## 2023-09-04 ENCOUNTER — Encounter (HOSPITAL_COMMUNITY): Payer: Self-pay | Admitting: Psychiatry

## 2023-09-04 ENCOUNTER — Telehealth (HOSPITAL_BASED_OUTPATIENT_CLINIC_OR_DEPARTMENT_OTHER): Payer: Medicare Other | Admitting: Psychiatry

## 2023-09-04 DIAGNOSIS — E559 Vitamin D deficiency, unspecified: Secondary | ICD-10-CM | POA: Diagnosis not present

## 2023-09-04 DIAGNOSIS — F840 Autistic disorder: Secondary | ICD-10-CM | POA: Diagnosis not present

## 2023-09-04 DIAGNOSIS — F251 Schizoaffective disorder, depressive type: Secondary | ICD-10-CM | POA: Diagnosis not present

## 2023-09-04 MED ORDER — TRAZODONE HCL 50 MG PO TABS
50.0000 mg | ORAL_TABLET | Freq: Every evening | ORAL | 2 refills | Status: DC | PRN
Start: 2023-09-04 — End: 2023-11-29

## 2023-09-04 MED ORDER — GUANFACINE HCL 2 MG PO TABS
2.0000 mg | ORAL_TABLET | Freq: Every day | ORAL | 2 refills | Status: DC
Start: 2023-09-04 — End: 2023-11-29

## 2023-09-04 MED ORDER — FLUOXETINE HCL 20 MG PO CAPS: 60.0000 mg | ORAL_CAPSULE | Freq: Every day | ORAL | 2 refills | Status: DC

## 2023-09-04 MED ORDER — OXCARBAZEPINE 300 MG PO TABS
300.0000 mg | ORAL_TABLET | Freq: Two times a day (BID) | ORAL | 2 refills | Status: DC
Start: 2023-09-04 — End: 2023-11-29

## 2023-09-04 MED ORDER — HYDROXYZINE HCL 25 MG PO TABS
25.0000 mg | ORAL_TABLET | Freq: Three times a day (TID) | ORAL | 1 refills | Status: DC | PRN
Start: 2023-09-04 — End: 2023-11-06

## 2023-09-04 MED ORDER — MIRTAZAPINE 30 MG PO TABS
30.0000 mg | ORAL_TABLET | Freq: Every day | ORAL | 2 refills | Status: DC
Start: 2023-09-04 — End: 2023-11-29

## 2023-09-04 MED ORDER — QUETIAPINE FUMARATE ER 400 MG PO TB24
800.0000 mg | ORAL_TABLET | Freq: Every day | ORAL | 2 refills | Status: DC
Start: 2023-09-04 — End: 2023-11-29

## 2023-10-28 NOTE — Progress Notes (Unsigned)
BH MD/PA/NP OP Progress Note  10/29/2023 2:07 PM Miguel Shea  MRN:  [id number]  Visit Diagnosis:    ICD-10-CM   1. Schizoaffective disorder, depressive type (HCC)  F25.1     2. Autism spectrum disorder  F84.0     3. Encounter for long-term (current) use of medications  Z79.899 Lipid Profile    HgB A1c      Assessment: Miguel Shea is a 27 y.o. male with a history of schizoaffective disorder and reported autism spectrum disorder who presented to [facility] at [facility] for initial evaluation on 02/09/2023.  At initial evaluation patient reported symptoms of visual hallucinations, paranoia, negative symptoms of schizophrenia, increased irritability, and auditory hallucinations which are command in nature and tell him to hurt/kill himself at times.  Patient reported that these are less intrusive than in the past and easily ignored when on the Haldol Decanoate or other antipsychotic medications.  Patient reported symptoms of lipsmacking and eye twitching which may be consistent with tardive dyskinesia.  Discussed the risk of being on multiple antipsychotics in addition to long-acting injectable.  Patient met criteria for schizoaffective disorder. He also carried a past dx of autism.   Miguel Shea presents for follow-up evaluation. Today, 10/29/23, patient reports fluctuations in mood in the interim.  He had still been experiencing the mood lability expressed previously after Seroquel had been increased to 800 mg at bedtime.  At times patient would have increased irritability which could progress to passive thoughts of suicide without any intent or plan.  He adjusted the dose to 400 mg twice a day and reports improvement in these symptoms.  Patient had question further titration of Trileptal which we discussed however opted to hold off on at this time.  This is in part due to patient already being on a number of medications and trying to manage  interactions and adverse effects.  We did review the adverse effects of several medications as well as her long-term adverse effects today.  As a number patient's stressors leading to mood lability symptoms are situational we suggested therapy however patient was adamantly against this.  That being the case we opted to continue on current medication regimen at this time and follow up in a month.  During the interim patient was instructed to get blood work to check his metabolic profile.  Plan: - Change Seroquel XR to 400 mg BID  - Continue Trileptal 300 mg BID - Continue Prozac 60 mg QD - Continue Remeron 30 mg QHS - Continue Trazodone 50 mg QD - Continue Atarax 25 mg TID prn for anxiety - Continue guanfacine 2 mg every day - Recommended therapy, patient declined - CBC, CMP, lipid profile, A1c, TSH reviewed - Recheck Lipid panel, and A1c - CT head w/o contrast reviewed - Crisis resources reviewed - Follow up in a month  Chief Complaint:  Chief Complaint  Patient presents with   Follow-up   HPI: Miguel Shea presents reporting that he has had his ups and downs over the past couple months.  He notes that he had found himself getting frustrated a bit more frequently for a while and then taking it out on himself.  Miguel Shea does this by picturing himself not being here though denies ever physically hurting himself or acting on those thoughts.  He also describes them as more intermittent and not consistent.  Patient reports that the situational stressors are financial issues of paying back alone, focus on current world events, and residual rumination over a  past relationship.  Since he changed the Seroquel dosing from 800 at bedtime to 400 mg twice a day he reports that the frustration he been experiencing has improved some.  While he had asked about increase in the mood stabilizer we recommended keeping it consistent at this time.  We reviewed the number of medications he is on, there are adverse side  effects (short and long term), and potential interactions.  It was suggested the patient start therapy to help manage his response to the situational stressors.  Miguel Shea was firmly against this noting that he already knows coping tactics and mindfulness techniques which he uses with some success.  Furthermore he has tried therapy in the past and dislikes it.  He describes himself as being more introverted and does not want to interact with others unnecessarily.  Furthermore he has concerns about any potential financial constraints this might cause.  We did review the benefits of therapy however patient remained firm in his desire not to start.  Ultimately we agreed to continue on his current regimen and follow-up in a month.  We also did review the importance of getting his blood work.  Patient requested that [facility] be [facility]  Past Psychiatric History:  Patient has multiple past hospitalizations last one in January 2023. No prior suicide attempts  Patient has tried lithium (over sedating), Depakote (leg swelling), Lamictal (irritability), Tegretol, Trileptal, Seroquel, Haldol dec (200 mg Q3.5 weeks), Zyprexa, Risperdal, Prozac, Remeron, Atarax, guanfacine, propranolol, and trazodone.  Denies any substance use  Past Medical History:  Past Medical History:  Diagnosis Date   Bipolar 1 disorder (HCC)    Wolff-Parkinson-White syndrome    No past surgical history on file.  Family History: No family history on file.  Social History:  Social History   Socioeconomic History   Marital status: Single    Spouse name: Not on file   Number of children: Not on file   Years of education: Not on file   Highest education level: Not on file  Occupational History   Not on file  Tobacco Use   Smoking status: Never   Smokeless tobacco: Never  Vaping Use   Vaping status: Some Days   Substances: Nicotine, Flavoring  Substance and Sexual Activity   Alcohol use: Never   Drug use: Never    Sexual activity: Not on file  Other Topics Concern   Not on file  Social History Narrative   ** Merged History Encounter **       Social Determinants of Health   Financial Resource Strain: Not on file  Food Insecurity: Not on file  Transportation Needs: Not on file  Physical Activity: Not on file  Stress: Not on file  Social Connections: Not on file    Allergies:  Allergies  Allergen Reactions   Augmentin [Amoxicillin-Pot Clavulanate] Rash    Current Medications: Current Outpatient Medications  Medication Sig Dispense Refill   FLUoxetine (PROZAC) 20 MG capsule Take 3 capsules (60 mg total) by mouth daily. 90 capsule 2   guanFACINE (TENEX) 2 MG tablet Take 1 tablet (2 mg total) by mouth at bedtime. For ADHD 30 tablet 2   hydrOXYzine (ATARAX) 25 MG tablet Take 1 tablet (25 mg total) by mouth 3 (three) times daily as needed. 90 tablet 1   mirtazapine (REMERON) 30 MG tablet Take 1 tablet (30 mg total) by mouth at bedtime. 30 tablet 2   Oxcarbazepine (TRILEPTAL) 300 MG tablet Take 1 tablet (300 mg total)  by mouth 2 (two) times daily. 60 tablet 2   QUEtiapine (SEROQUEL XR) 400 MG 24 hr tablet Take 2 tablets (800 mg total) by mouth at bedtime. 60 tablet 2   traZODone (DESYREL) 50 MG tablet Take 1 tablet (50 mg total) by mouth at bedtime as needed for sleep. 30 tablet 2   No current facility-administered medications for this visit.     Psychiatric Specialty Exam: Review of Systems  There were no vitals taken for this visit.There is no height or weight on file to calculate BMI.  General Appearance: Fairly Groomed  Eye Contact:  Good  Speech:  Clear and Coherent and Normal Rate  Volume:  Normal  Mood:  Anxious and Irritable  Affect:  Congruent  Thought Process:  Coherent and Goal Directed  Orientation:  Full (Time, Place, and Person)  Thought Content: Logical and Hallucinations: Visual Balls of light    Suicidal Thoughts:  No  Homicidal Thoughts:  No  Memory:  Immediate;    Good  Judgement:  Fair  Insight:  Lacking  Psychomotor Activity:  Normal  Concentration:  Concentration: Fair  Recall:  Good  Fund of Knowledge: Fair  Language: Good  Akathisia:  No    AIMS (if indicated): not done  Assets:  Communication Skills Desire for Improvement Financial Resources/Insurance Physical Health Resilience Talents/Skills Transportation Vocational/Educational  ADL's:  Intact  Cognition: WNL  Sleep:  Good   Metabolic Disorder Labs: Lab Results  Component Value Date   HGBA1C 4.6 (L) 01/04/2022   MPG 85.32 01/04/2022   No results found for: "PROLACTIN" Lab Results  Component Value Date   CHOL 148 01/04/2022   TRIG 104 01/04/2022   HDL 46 01/04/2022   CHOLHDL 3.2 01/04/2022   VLDL 21 01/04/2022   LDLCALC 81 01/04/2022   Lab Results  Component Value Date   TSH 1.942 01/04/2022    Therapeutic Level Labs: No results found for: "LITHIUM" No results found for: "VALPROATE" No results found for: "CBMZ"   Screenings: AIMS    Flowsheet Row [facility]  AIMS Total Score 0      AUDIT    Flowsheet Row Ad[facility] ([facility]  Alcohol Use Disorder Identification Test Final Score (AUDIT) 0      GAD-7    Flowsheet Row Video [facility]  Total GAD-7 Score 3      PHQ2-9    Flowsheet Row Video Visit from 02/09/2023 [facility]  PHQ-2 Total Score 1      Flowsheet Row Video Visit from 02/09/2023 [facility] Most recent reading at 02/09/2023 10:35 AM Ad[facility] (Discharged) [facility] Most recent reading at 01/02/2022  9:45 PM ED from 01/02/2022 in [facility] Most recent  reading at 01/02/2022  5:53 PM  C-SSRS RISK CATEGORY Low Risk High Risk High Risk       Collaboration of Care: Collaboration of Care: Medication Management AEB medication prescription  Patient/Guardian was advised Release of Information must be obtained prior to any record release in order to collaborate their care with an outside provider. Patient/Guardian was advised if they have not already done so to contact the registration department to sign all necessary forms in order for [place] to release information regarding their care.   Consent: Patient/Guardian  gives verbal consent for treatment and assignment of benefits for services provided during this visit. Patient/Guardian expressed understanding and agreed to proceed.    Stasia Cavalier, MD 10/29/2023, 2:07 PM   Virtual Visit via Video Note  I connected with Doylene Canard on 10/29/23 at  1:30 PM EDT by a video enabled telemedicine application and verified that I am speaking with the correct person using two identifiers.  Location: Patient: Home Provider: Home Office   I discussed the limitations of evaluation and management by telemedicine and the availability of in person appointments. The patient expressed understanding and agreed to proceed.   I discussed the assessment and treatment plan with the patient. The patient was provided an opportunity to ask questions and all were answered. The patient agreed with the plan and demonstrated an understanding of the instructions.   The patient was advised to call back or seek an in-person evaluation if the symptoms worsen or if the condition fails to improve as anticipated.  30 minutes were spent in chart review, interview, psycho education, counseling, medical decision making, coordination of care and long-term prognosis.  Patient was given opportunity to ask question and all concerns and questions were addressed and answers. Excluding separately billable services.   Stasia Cavalier, MD

## 2023-10-29 ENCOUNTER — Encounter (HOSPITAL_COMMUNITY): Payer: Self-pay | Admitting: Psychiatry

## 2023-10-29 ENCOUNTER — Telehealth (HOSPITAL_COMMUNITY): Payer: Medicare Other | Admitting: Psychiatry

## 2023-10-29 DIAGNOSIS — Z79899 Other long term (current) drug therapy: Secondary | ICD-10-CM

## 2023-10-29 DIAGNOSIS — F840 Autistic disorder: Secondary | ICD-10-CM | POA: Diagnosis not present

## 2023-10-29 DIAGNOSIS — F251 Schizoaffective disorder, depressive type: Secondary | ICD-10-CM | POA: Diagnosis not present

## 2023-11-06 ENCOUNTER — Other Ambulatory Visit (HOSPITAL_COMMUNITY): Payer: Self-pay | Admitting: Psychiatry

## 2023-11-06 DIAGNOSIS — F251 Schizoaffective disorder, depressive type: Secondary | ICD-10-CM

## 2023-11-25 ENCOUNTER — Other Ambulatory Visit (HOSPITAL_COMMUNITY): Payer: Self-pay | Admitting: Psychiatry

## 2023-11-26 ENCOUNTER — Telehealth (HOSPITAL_COMMUNITY): Payer: Self-pay | Admitting: *Deleted

## 2023-11-26 LAB — LIPID PANEL WITH LDL/HDL RATIO
Cholesterol, Total: 274 mg/dL — ABNORMAL HIGH (ref 100–199)
HDL: 40 mg/dL (ref >39)
LDL Chol Calc (NIH): 154 mg/dL — ABNORMAL HIGH (ref 0–99)
LDL/HDL Ratio: 3.9 {ratio} — ABNORMAL HIGH (ref 0.0–3.6)
Triglycerides: 423 mg/dL — ABNORMAL HIGH (ref 0–149)
VLDL Cholesterol Cal: 80 mg/dL — ABNORMAL HIGH (ref 5–40)

## 2023-11-26 LAB — HGB A1C W/O EAG: Hgb A1c MFr Bld: 5 % (ref 4.8–5.6)

## 2023-11-26 NOTE — Telephone Encounter (Signed)
Results received from [facility] from labs drawn 11/25/23 which consist of Lipid Panel w/LDL/HDL ratio:  Cholesterol, Total: 274 Triglycerides: 423 VLDL: 80 LDL: 154 LDL/HDL Ratio: 3.9 HgbA1C:5.0

## 2023-11-27 NOTE — Progress Notes (Signed)
BH MD/PA/NP OP Progress Note  11/29/2023 10:54 AM Miguel Shea  MRN:  [id number]  Visit Diagnosis:    ICD-10-CM   1. Schizoaffective disorder, depressive type (HCC)  F25.1 FLUoxetine (PROZAC) 20 MG capsule    guanFACINE (TENEX) 2 MG tablet    mirtazapine (REMERON) 30 MG tablet    Oxcarbazepine (TRILEPTAL) 300 MG tablet    QUEtiapine (SEROQUEL XR) 400 MG 24 hr tablet    traZODone (DESYREL) 50 MG tablet    2. Autism spectrum disorder  F84.0      Assessment: Miguel Shea is a 27 y.o. male with a history of schizoaffective disorder and reported autism spectrum disorder who presented to [facility] at [facility] for initial evaluation on 02/09/2023.  At initial evaluation patient reported symptoms of visual hallucinations, paranoia, negative symptoms of schizophrenia, increased irritability, and auditory hallucinations which are command in nature and tell him to hurt/kill himself at times.  Patient reported that these are less intrusive than in the past and easily ignored when on the Haldol Decanoate or other antipsychotic medications.  Patient reported symptoms of lipsmacking and eye twitching which may be consistent with tardive dyskinesia.  Discussed the risk of being on multiple antipsychotics in addition to long-acting injectable.  Patient met criteria for schizoaffective disorder. He also carried a past dx of autism.   Miguel Shea presents for follow-up evaluation. Today, 11/29/23, patient reports some intermittent fluctuations in his mood still most often after a stressor in his life.  Overall however he has improved some compared to last visit.  Patient is taking his medication consistently and reports involuntary motor twitching in his chin as the only adverse side effects.  Patient reports that these are mild and not overly bothersome.  They have remained unchanged since the initiation of Seroquel.  Discussed starting Cogentin however patient had  not felt it was necessary at this time.  If symptoms worsen we can reconsider in the future.  Patient's lipid profile was completed and showed elevated LDL, VLDL, cholesterol, and triglycerides.  He did eat prior to the exam which may have artificially inflated some of the results particularly the triglycerides.  That said patient still benefit from connecting with a PCP to discuss whether statin medications would be indicated.  Plan: - Continue Seroquel XR to 400 mg BID  - Continue Trileptal 300 mg BID - Continue Prozac 60 mg QD - Continue Remeron 30 mg QHS - Continue Trazodone 50 mg QD - Continue Atarax 25 mg TID prn for anxiety - Continue guanfacine 2 mg every day - Recommended therapy, patient declined - CBC, CMP, lipid profile, A1c, TSH reviewed - Lipid panel from 11/25/23 showed elevated LDL, VLDL, cholesterol, and triglyceride - CT head w/o contrast reviewed - Crisis resources reviewed - PCP referral - Follow up in 2 months  Chief Complaint:  Chief Complaint  Patient presents with   Follow-up   HPI: Miguel Shea presents reporting that he is doing alright. Things have improved some in the interim. He had some worry about his financial situation last appointment which he thinks was contributing to his mood symptoms.  He does note that when an unexpected stressor occurs in his life things can get tougher.  He can take a little while to adapt and his mood can feel more unstable.  Patient finds it tough but has been able to get through this.  He feels like the Seroquel dosing of twice a day is helpful in this regard.  In regards to side  effects Miguel Shea reports that he still seeing black specks and has a twitch on the left side of his chin.  Patient reports that this twitch has been ongoing since the initiation of Seroquel and describes it as an involuntary muscle clenched.  He reports that it happens a few times a day and is not overly bothersome.  We discussed the possibility of Cogentin  however patient did not feel it was needed at this time.  Patient was agreeable to continuing on his current medication regimen.  He did complete his blood work in the interim and we reviewed his lipid profile today.  Patient reports that he was not fasting beforehand which he attributes to the elevated lipid profile.  We explained that yes this could be a contributing factor though would more likely impact the triglycerides.  That said he would benefit from establishing with a primary care provider which we will refer him to.  With them he can discuss whether medications for cholesterol control are needed.  We also reviewed some dietary adjustments that could be made.  Explaining that cutting out a meal would not necessarily impact his cholesterol.  Instead the types of food he eats would have a bigger role.  Past Psychiatric History:  Patient has multiple past hospitalizations last one in January 2023. No prior suicide attempts  Patient has tried lithium (over sedating), Depakote (leg swelling), Lamictal (irritability), Tegretol, Trileptal, Seroquel, Haldol dec (200 mg Q3.5 weeks), Zyprexa, Risperdal, Prozac, Remeron, Atarax, guanfacine, propranolol, and trazodone.  Denies any substance use  Past Medical History:  Past Medical History:  Diagnosis Date   Bipolar 1 disorder (HCC)    Wolff-Parkinson-White syndrome    No past surgical history on file.  Family History: No family history on file.  Social History:  Social History   Socioeconomic History   Marital status: Single    Spouse name: Not on file   Number of children: Not on file   Years of education: Not on file   Highest education level: Not on file  Occupational History   Not on file  Tobacco Use   Smoking status: Never   Smokeless tobacco: Never  Vaping Use   Vaping status: Some Days   Substances: Nicotine, Flavoring  Substance and Sexual Activity   Alcohol use: Never   Drug use: Never   Sexual activity: Not on file   Other Topics Concern   Not on file  Social History Narrative   ** Merged History Encounter **       Social Determinants of Health   Financial Resource Strain: Not on file  Food Insecurity: Not on file  Transportation Needs: Not on file  Physical Activity: Not on file  Stress: Not on file  Social Connections: Not on file    Allergies:  Allergies  Allergen Reactions   Augmentin [Amoxicillin-Pot Clavulanate] Rash    Current Medications: Current Outpatient Medications  Medication Sig Dispense Refill   FLUoxetine (PROZAC) 20 MG capsule Take 3 capsules (60 mg total) by mouth daily. 90 capsule 2   guanFACINE (TENEX) 2 MG tablet Take 1 tablet (2 mg total) by mouth at bedtime. For ADHD 30 tablet 2   hydrOXYzine (ATARAX) 25 MG tablet TAKE 1 TABLET (25 MG TOTAL) BY MOUTH THREE (THREE) TIMES DAILY AS NEEDED. 90 tablet 0   mirtazapine (REMERON) 30 MG tablet Take 1 tablet (30 mg total) by mouth at bedtime. 30 tablet 2   Oxcarbazepine (TRILEPTAL) 300 MG tablet Take 1 tablet (300 mg total)  by mouth 2 (two) times daily. 60 tablet 2   QUEtiapine (SEROQUEL XR) 400 MG 24 hr tablet Take 2 tablets (800 mg total) by mouth at bedtime. 60 tablet 2   traZODone (DESYREL) 50 MG tablet Take 1 tablet (50 mg total) by mouth at bedtime as needed for sleep. 30 tablet 2   No current facility-administered medications for this visit.     Psychiatric Specialty Exam: Review of Systems  There were no vitals taken for this visit.There is no height or weight on file to calculate BMI.  General Appearance: Fairly Groomed  Eye Contact:  Good  Speech:  Clear and Coherent and Normal Rate  Volume:  Normal  Mood:  Anxious and Euthymic  Affect:  Congruent  Thought Process:  Coherent and Goal Directed  Orientation:  Full (Time, Place, and Person)  Thought Content: Logical and Hallucinations: Visual Balls of light    Suicidal Thoughts:  No  Homicidal Thoughts:  No  Memory:  Immediate;   Good  Judgement:  Fair   Insight:  Lacking  Psychomotor Activity:  Normal  Concentration:  Concentration: Fair  Recall:  Good  Fund of Knowledge: Fair  Language: Good  Akathisia:  No    AIMS (if indicated): not done  Assets:  Communication Skills Desire for Improvement Financial Resources/Insurance Physical Health Resilience Talents/Skills Transportation Vocational/Educational  ADL's:  Intact  Cognition: WNL  Sleep:  Good   Metabolic Disorder Labs: Lab Results  Component Value Date   HGBA1C 5.0 11/25/2023   MPG 85.32 01/04/2022   No results found for: "PROLACTIN" Lab Results  Component Value Date   CHOL 274 (H) 11/25/2023   TRIG 423 (H) 11/25/2023   HDL 40 11/25/2023   CHOLHDL 3.2 01/04/2022   VLDL 21 01/04/2022   LDLCALC 154 (H) 11/25/2023   LDLCALC 81 01/04/2022   Lab Results  Component Value Date   TSH 1.942 01/04/2022    Therapeutic Level Labs: No results found for: "LITHIUM" No results found for: "VALPROATE" No results found for: "CBMZ"   Screenings: AIMS    Flowsheet Row [facility]  AIMS Total Score 0      AUDIT    Flowsheet Row Admission (Discharged) [facility]  Alcohol Use Disorder Identification Test Final Score (AUDIT) 0      GAD-7    Flowsheet Row Video Visit from 02/09/2023 [facility]  Total GAD-7 Score 3      PHQ2-9    Flowsheet Row Video Visit from 02/09/2023 in [facility]  PHQ-2 Total Score 1      Flowsheet Row Video Visit from 02/09/2023 in [facility] Most recent reading at 02/09/2023 10:35 AM Admission (Discharged) [facility] Most recent reading at 01/02/2022  9:45 PM ED from 01/02/2022 in [facility] Most  recent reading at 01/02/2022  5:53 PM  C-SSRS RISK CATEGORY Low Risk High Risk High Risk       Collaboration of Care: Collaboration of Care: Medication Management AEB medication prescription  Patient/Guardian was advised Release of Information must be obtained prior to any record release in order to collaborate their care with an outside provider. Patient/Guardian was advised if they have not already done so to contact the registration department to sign all necessary forms in order for [place] to release information  regarding their care.   Consent: Patient/Guardian gives verbal consent for treatment and assignment of benefits for services provided during this visit. Patient/Guardian expressed understanding and agreed to proceed.    Stasia Cavalier, MD 11/29/2023, 10:54 AM   Virtual Visit via Video Note  I connected with Miguel Shea on 11/29/23 at 10:30 AM EST by a video enabled telemedicine application and verified that I am speaking with the correct person using two identifiers.  Location: Patient: Home Provider: Home Office   I discussed the limitations of evaluation and management by telemedicine and the availability of in person appointments. The patient expressed understanding and agreed to proceed.   I discussed the assessment and treatment plan with the patient. The patient was provided an opportunity to ask questions and all were answered. The patient agreed with the plan and demonstrated an understanding of the instructions.   The patient was advised to call back or seek an in-person evaluation if the symptoms worsen or if the condition fails to improve as anticipated.  30 minutes were spent in chart review, interview, psycho education, counseling, medical decision making, coordination of care and long-term prognosis.  Patient was given opportunity to ask question and all concerns and questions were addressed and answers. Excluding separately billable services.   Stasia Cavalier, MD

## 2023-11-29 ENCOUNTER — Telehealth (HOSPITAL_BASED_OUTPATIENT_CLINIC_OR_DEPARTMENT_OTHER): Payer: Medicare Other | Admitting: Psychiatry

## 2023-11-29 ENCOUNTER — Encounter (HOSPITAL_COMMUNITY): Payer: Self-pay | Admitting: Psychiatry

## 2023-11-29 DIAGNOSIS — F251 Schizoaffective disorder, depressive type: Secondary | ICD-10-CM | POA: Diagnosis not present

## 2023-11-29 DIAGNOSIS — F840 Autistic disorder: Secondary | ICD-10-CM

## 2023-11-29 MED ORDER — FLUOXETINE HCL 20 MG PO CAPS: 60.0000 mg | ORAL_CAPSULE | Freq: Every day | ORAL | 2 refills | Status: DC

## 2023-11-29 MED ORDER — GUANFACINE HCL 2 MG PO TABS: 2.0000 mg | ORAL_TABLET | Freq: Every day | ORAL | 2 refills | Status: DC

## 2023-11-29 MED ORDER — QUETIAPINE FUMARATE ER 400 MG PO TB24: 800.0000 mg | ORAL_TABLET | Freq: Every day | ORAL | 2 refills | Status: DC

## 2023-11-29 MED ORDER — OXCARBAZEPINE 300 MG PO TABS: 300.0000 mg | ORAL_TABLET | Freq: Two times a day (BID) | ORAL | 2 refills | Status: DC

## 2023-11-29 MED ORDER — MIRTAZAPINE 30 MG PO TABS: 30.0000 mg | ORAL_TABLET | Freq: Every day | ORAL | 2 refills | Status: DC

## 2023-11-29 MED ORDER — TRAZODONE HCL 50 MG PO TABS: 50.0000 mg | ORAL_TABLET | Freq: Every evening | ORAL | 2 refills | Status: DC | PRN

## 2023-12-03 ENCOUNTER — Other Ambulatory Visit (HOSPITAL_COMMUNITY): Payer: Self-pay | Admitting: Psychiatry

## 2023-12-03 DIAGNOSIS — F251 Schizoaffective disorder, depressive type: Secondary | ICD-10-CM

## 2024-01-13 ENCOUNTER — Other Ambulatory Visit (HOSPITAL_COMMUNITY): Payer: Self-pay | Admitting: Psychiatry

## 2024-01-13 DIAGNOSIS — F251 Schizoaffective disorder, depressive type: Secondary | ICD-10-CM

## 2024-01-29 NOTE — Progress Notes (Signed)
BH MD/PA/NP OP Progress Note  01/31/2024 10:57 AM Miguel Shea  MRN:  [id number]  Visit Diagnosis:    ICD-10-CM   1. Schizoaffective disorder, depressive type (HCC)  F25.1 traZODone (DESYREL) 50 MG tablet    QUEtiapine (SEROQUEL XR) 400 MG 24 hr tablet    Oxcarbazepine (TRILEPTAL) 300 MG tablet    mirtazapine (REMERON) 30 MG tablet    guanFACINE (TENEX) 2 MG tablet    FLUoxetine (PROZAC) 20 MG capsule    2. Autism spectrum disorder  F84.0       Assessment: Miguel Shea is a 28 y.o. male with a history of schizoaffective disorder and reported autism spectrum disorder who presented to [facility] at [facility] for initial evaluation on 02/09/2023.  At initial evaluation patient reported symptoms of visual hallucinations, paranoia, negative symptoms of schizophrenia, increased irritability, and auditory hallucinations which are command in nature and tell him to hurt/kill himself at times.  Patient reported that these are less intrusive than in the past and easily ignored when on the Haldol Decanoate or other antipsychotic medications.  Patient reported symptoms of lipsmacking and eye twitching which may be consistent with tardive dyskinesia.  Discussed the risk of being on multiple antipsychotics in addition to long-acting injectable.  Patient met criteria for schizoaffective disorder. He also carried a past dx of autism.   Miguel Shea presents for follow-up evaluation. Today, 01/31/24, patient reports that moods overall been stable.  He had some concern of a hypomanic episode where he was more frustrated that lasted a week.  During this time patient had self-discontinued Prozac and experienced some withdrawal symptoms.  He restarted the medication a week later as his depressive symptoms got worse.  His suspected hypomanic episode occurred in the context of financial stressors and did not have any symptoms of hypomania other than increased irritability.   His depressive symptoms improved after restarting Prozac.  Reviewed that he should reach out for this provider prior to making any medication adjustments.  We will continue on his current regimen and follow-up in 2 months.  His metabolic labs are up-to-date.  Plan: - Continue Seroquel XR to 400 mg BID  - Continue Trileptal 300 mg BID - Continue Prozac 60 mg QD - Continue Remeron 30 mg QHS - Continue Trazodone 50 mg QD - Continue Atarax 25 mg TID prn for anxiety - Continue guanfacine 2 mg every day - Recommended therapy, patient declined - CBC, CMP, lipid profile, A1c, TSH reviewed - Lipid panel from 11/25/23 showed elevated LDL, VLDL, cholesterol, and triglyceride - CT head w/o contrast reviewed - Crisis resources reviewed - PCP referral - Follow up in 2 months  Chief Complaint:  Chief Complaint  Patient presents with   Follow-up   HPI: Miguel Shea presents reporting that the past 2 months his mood has been pretty manageable.   He has been isolated, not having much motivation to go out and do things. He tries to consolidate things into as few trips as possible. Some financial limitations. But Miguel Shea thinks depression and paranoia are the biggest contributors to that. From his perspective he does not feel that social interactions are normal, so anything he does feels unnatural. He believes he gets his point across but it feels awkward. Due to these reasons he has reduced his time out of the house, and he is not bothered by spending more time in the house.   Typically at home after getting up, eating, and taking his medicine he will turn on a video  stream to watch. He would also interact with his online friends during this time. Breaking that routine is more difficult for him.  He has stopped the Prozac for a week due to concern that he was getting hypomanic. During that time he had more stress with finances and had been noticing frustration which he interpreted it as hypomania. After a week  off the Prozac however he noticed some of the depression coming back and this restarted the medication. He did experience some nausea, brain zaps, and withdrawal symptoms when stopping medication.  We reviewed the importance of coordinating any changes with his provider.  He has continued to take the rest of his medications consistently with the exception of the as needed hydroxyzine.  Patient reports he has not needed this recently.  We will continue on his current regimen and follow-up in 2 months.  Past Psychiatric History:  Patient has multiple past hospitalizations last one in January 2023. No prior suicide attempts  Patient has tried lithium (over sedating), Depakote (leg swelling), Lamictal (irritability), Tegretol, Trileptal, Seroquel, Haldol dec (200 mg Q3.5 weeks), Zyprexa, Risperdal, Prozac, Remeron, Atarax, guanfacine, propranolol, and trazodone.  Denies any substance use  Past Medical History:  Past Medical History:  Diagnosis Date   Bipolar 1 disorder (HCC)    Wolff-Parkinson-White syndrome    No past surgical history on file.  Family History: No family history on file.  Social History:  Social History   Socioeconomic History   Marital status: Single    Spouse name: Not on file   Number of children: Not on file   Years of education: Not on file   Highest education level: Not on file  Occupational History   Not on file  Tobacco Use   Smoking status: Never   Smokeless tobacco: Never  Vaping Use   Vaping status: Some Days   Substances: Nicotine, Flavoring  Substance and Sexual Activity   Alcohol use: Never   Drug use: Never   Sexual activity: Not on file  Other Topics Concern   Not on file  Social History Narrative   ** Merged History Encounter **       Social Drivers of Health   Financial Resource Strain: Not on file  Food Insecurity: Not on file  Transportation Needs: Not on file  Physical Activity: Not on file  Stress: Not on file  Social Connections:  Not on file    Allergies:  Allergies  Allergen Reactions   Augmentin [Amoxicillin-Pot Clavulanate] Rash    Current Medications: Current Outpatient Medications  Medication Sig Dispense Refill   FLUoxetine (PROZAC) 20 MG capsule Take 3 capsules (60 mg total) by mouth daily. 90 capsule 2   guanFACINE (TENEX) 2 MG tablet Take 1 tablet (2 mg total) by mouth at bedtime. For ADHD 30 tablet 2   hydrOXYzine (ATARAX) 25 MG tablet TAKE ONE TABLET BY MOUTH THREE TIMES A DAY AS NEEDED 90 tablet 0   mirtazapine (REMERON) 30 MG tablet Take 1 tablet (30 mg total) by mouth at bedtime. 30 tablet 2   Oxcarbazepine (TRILEPTAL) 300 MG tablet Take 1 tablet (300 mg total) by mouth 2 (two) times daily. 60 tablet 2   QUEtiapine (SEROQUEL XR) 400 MG 24 hr tablet Take 2 tablets (800 mg total) by mouth at bedtime. 60 tablet 2   traZODone (DESYREL) 50 MG tablet Take 1 tablet (50 mg total) by mouth at bedtime as needed for sleep. 30 tablet 2   No current facility-administered medications for this visit.  Psychiatric Specialty Exam: Review of Systems  There were no vitals taken for this visit.There is no height or weight on file to calculate BMI.  General Appearance: Fairly Groomed  Eye Contact:  Good  Speech:  Clear and Coherent and Normal Rate  Volume:  Normal  Mood:  Anxious and Euthymic  Affect:  Congruent  Thought Process:  Coherent and Goal Directed  Orientation:  Full (Time, Place, and Person)  Thought Content: Logical and Hallucinations: Visual Balls of light    Suicidal Thoughts:  No  Homicidal Thoughts:  No  Memory:  Immediate;   Good  Judgement:  Fair  Insight:  Lacking  Psychomotor Activity:  Normal  Concentration:  Concentration: Fair  Recall:  Good  Fund of Knowledge: Fair  Language: Good  Akathisia:  No    AIMS (if indicated): not done  Assets:  Communication Skills Desire for Improvement Financial Resources/Insurance Physical  Health Resilience Talents/Skills Transportation Vocational/Educational  ADL's:  Intact  Cognition: WNL  Sleep:  Good   Metabolic Disorder Labs: Lab Results  Component Value Date   HGBA1C 5.0 11/25/2023   MPG 85.32 01/04/2022   No results found for: "PROLACTIN" Lab Results  Component Value Date   CHOL 274 (H) 11/25/2023   TRIG 423 (H) 11/25/2023   HDL 40 11/25/2023   CHOLHDL 3.2 01/04/2022   VLDL 21 01/04/2022   LDLCALC 154 (H) 11/25/2023   LDLCALC 81 01/04/2022   Lab Results  Component Value Date   TSH 1.942 01/04/2022    Therapeutic Level Labs: No results found for: "LITHIUM" No results found for: "VALPROATE" No results found for: "CBMZ"   Screenings: AIMS    Flowsheet Row [facility]  AIMS Total Score 0      AUDIT    Flowsheet Row Ad[facility] ([facility]  Alcohol Use Disorder Identification Test Final Score (AUDIT) 0      GAD-7    Flowsheet Row Video [facility]  Total GAD-7 Score 3      PHQ2-9    Flowsheet Row Video Visit from 02/09/2023 [facility]  PHQ-2 Total Score 1      Flowsheet Row Video Visit from 02/09/2023 [facility] Most recent reading at 02/09/2023 10:35 AM Ad[facility] (Discharged) [facility] Most recent reading at 01/02/2022  9:45 PM ED from 01/02/2022 in [facility] at [facility] Most recent reading at 01/02/2022  5:53 PM  C-SSRS RISK CATEGORY Low Risk High Risk High Risk       Collaboration of Care: Collaboration of Care: Medication Management AEB medication prescription  Patient/Guardian was advised Release of Information must be obtained prior to any record release in  order to collaborate their care with an outside provider. Patient/Guardian was advised if they have not already done so to contact the registration department to sign all necessary forms in order for [place] to release information regarding their care.   Consent: Patient/Guardian gives verbal consent for treatment and assignment of benefits for services provided during this visit. Patient/Guardian expressed understanding and agreed to proceed.    Stasia Cavalier, MD 01/31/2024, 10:57 AM   Virtual Visit via Video Note  I connected with Miguel Shea on 01/31/24 at 10:30 AM EST by a video enabled telemedicine application and verified that I am  speaking with the correct person using two identifiers.  Location: Patient: Home Provider: Home Office   I discussed the limitations of evaluation and management by telemedicine and the availability of in person appointments. The patient expressed understanding and agreed to proceed.   I discussed the assessment and treatment plan with the patient. The patient was provided an opportunity to ask questions and all were answered. The patient agreed with the plan and demonstrated an understanding of the instructions.   The patient was advised to call back or seek an in-person evaluation if the symptoms worsen or if the condition fails to improve as anticipated.  30 minutes were spent in chart review, interview, psycho education, counseling, medical decision making, coordination of care and long-term prognosis.  Patient was given opportunity to ask question and all concerns and questions were addressed and answers. Excluding separately billable services.   Stasia Cavalier, MD

## 2024-01-31 ENCOUNTER — Encounter (HOSPITAL_COMMUNITY): Payer: Self-pay | Admitting: Psychiatry

## 2024-01-31 ENCOUNTER — Telehealth (HOSPITAL_BASED_OUTPATIENT_CLINIC_OR_DEPARTMENT_OTHER): Payer: Medicare Other | Admitting: Psychiatry

## 2024-01-31 DIAGNOSIS — F251 Schizoaffective disorder, depressive type: Secondary | ICD-10-CM

## 2024-01-31 DIAGNOSIS — F840 Autistic disorder: Secondary | ICD-10-CM

## 2024-01-31 MED ORDER — TRAZODONE HCL 50 MG PO TABS: 50.0000 mg | ORAL_TABLET | Freq: Every evening | ORAL | 2 refills | Status: DC | PRN

## 2024-01-31 MED ORDER — MIRTAZAPINE 30 MG PO TABS: 30.0000 mg | ORAL_TABLET | Freq: Every day | ORAL | 2 refills | Status: DC

## 2024-01-31 MED ORDER — FLUOXETINE HCL 20 MG PO CAPS: 60.0000 mg | ORAL_CAPSULE | Freq: Every day | ORAL | 2 refills | Status: DC

## 2024-01-31 MED ORDER — GUANFACINE HCL 2 MG PO TABS: 2.0000 mg | ORAL_TABLET | Freq: Every day | ORAL | 2 refills | Status: DC

## 2024-01-31 MED ORDER — QUETIAPINE FUMARATE ER 400 MG PO TB24: 800.0000 mg | ORAL_TABLET | Freq: Every day | ORAL | 2 refills | Status: DC

## 2024-01-31 MED ORDER — OXCARBAZEPINE 300 MG PO TABS: 300.0000 mg | ORAL_TABLET | Freq: Two times a day (BID) | ORAL | 2 refills | Status: DC

## 2024-02-21 ENCOUNTER — Ambulatory Visit: Payer: Medicare Other | Admitting: Family

## 2024-03-23 NOTE — Progress Notes (Unsigned)
 BH MD/PA/NP OP Progress Note  03/27/2024 10:57 AM Miguel Shea  MRN:  [id number]  Visit Diagnosis:    ICD-10-CM   1. Schizoaffective disorder, depressive type (HCC)  F25.1 QUEtiapine (SEROQUEL XR) 400 MG 24 hr tablet    Oxcarbazepine (TRILEPTAL) 300 MG tablet    mirtazapine (REMERON) 30 MG tablet    guanFACINE (TENEX) 2 MG tablet    FLUoxetine (PROZAC) 20 MG capsule    traZODone (DESYREL) 50 MG tablet    2. Autism spectrum disorder  F84.0        Assessment: Miguel Shea is a 28 y.o. male with a history of schizoaffective disorder and reported autism spectrum disorder who presented to [facility] at [facility] for initial evaluation on 02/09/2023.  At initial evaluation patient reported symptoms of visual hallucinations, paranoia, negative symptoms of schizophrenia, increased irritability, and auditory hallucinations which are command in nature and tell him to hurt/kill himself at times.  Patient reported that these are less intrusive than in the past and easily ignored when on the Haldol Decanoate or other antipsychotic medications.  Patient reported symptoms of lipsmacking and eye twitching which may be consistent with tardive dyskinesia.  Discussed the risk of being on multiple antipsychotics in addition to long-acting injectable.  Patient met criteria for schizoaffective disorder. He also carried a past dx of autism.   Miguel Shea presents for follow-up evaluation. Today, 03/27/24, patient reports that mood has been stable with gradual improvement.  This appears to be secondary to patient adding more structure into his days and building a routine.  Patient does have intermittent episodes of anxiety which occur when there is concern about his routine being disrupted.  Discussed using coping skills and grounding techniques to manage this as opposed to further titrating medication.  Patient was agreeable and did identify that these episodes are not  occurring frequently.  We will continue on his current regimen and follow-up in 3 months.  Psychotherapeutic interventions were used during today's session. From 10:35 AM to 10:55 AM we used empathic listening techniques and provided support. Worked on cognitive re framing techniques and focusing on behavioral activation.  Discussed various grounding techniques to use during episodes of increased anxiety.  Improvement was evidenced by patient's participation.   Plan: - Continue Seroquel XR to 400 mg BID  - Continue Trileptal 300 mg BID - Continue Prozac 60 mg QD - Continue Remeron 30 mg QHS - Continue Trazodone 50 mg QD - Continue Atarax 25 mg TID prn for anxiety - Continue guanfacine 2 mg every day - Recommended therapy, patient declined - CBC, CMP, lipid profile, A1c, TSH reviewed - Lipid panel from 11/25/23 showed elevated LDL, VLDL, cholesterol, and triglyceride - CT head w/o contrast reviewed - Crisis resources reviewed - PCP referral - Follow up in 2 months  Chief Complaint:  Chief Complaint  Patient presents with   Follow-up   HPI: Miguel Shea presents reporting that he has been doing pretty alright, with things improving overall. He has been able to get into a basic routine, improve his money situation, and having time since several negative life events.He feels that the medication is the right level, and it just took time for things to fall into place.  He reports that he sleeping well and his appetite has gone down some. As his mood improved he noticed that his appetite decreased.  There are times when he does find it more difficult to control his emotions. He describes this as him wanting to have control  of the situation, but has doubts about what the right course of action. This is usually after there is a build up of stress like if he were to lose some of his stability, and he feels like things are out of control.  This last happened when he was worried about whether he needed  to get a job if he loses his eligibility for disability. He had heard there is a reassessment every 7 years and thinks he is coming up on that. Provided support around this and discussed the importance of using coping skills during these periods. He was able to identify spending time with his cats.  Reviewed other grounding techniques including box breathing and diaphragmatic breathing.  Past Psychiatric History:  Patient has multiple past hospitalizations last one in January 2023. No prior suicide attempts  Patient has tried lithium (over sedating), Depakote (leg swelling), Lamictal (irritability), Tegretol, Trileptal, Seroquel, Haldol dec (200 mg Q3.5 weeks), Zyprexa, Risperdal, Prozac, Remeron, Atarax, guanfacine, propranolol, and trazodone.  Denies any substance use  Past Medical History:  Past Medical History:  Diagnosis Date   Bipolar 1 disorder (HCC)    Wolff-Parkinson-White syndrome    History reviewed. No pertinent surgical history.  Family History: History reviewed. No pertinent family history.  Social History:  Social History   Socioeconomic History   Marital status: Single    Spouse name: Not on file   Number of children: Not on file   Years of education: Not on file   Highest education level: Not on file  Occupational History   Not on file  Tobacco Use   Smoking status: Never   Smokeless tobacco: Never  Vaping Use   Vaping status: Some Days   Substances: Nicotine, Flavoring  Substance and Sexual Activity   Alcohol use: Never   Drug use: Never   Sexual activity: Not on file  Other Topics Concern   Not on file  Social History Narrative   ** Merged History Encounter **       Social Drivers of Health   Financial Resource Strain: Not on file  Food Insecurity: Not on file  Transportation Needs: Not on file  Physical Activity: Not on file  Stress: Not on file  Social Connections: Not on file    Allergies:  Allergies  Allergen Reactions   Augmentin  [Amoxicillin-Pot Clavulanate] Rash    Current Medications: Current Outpatient Medications  Medication Sig Dispense Refill   FLUoxetine (PROZAC) 20 MG capsule Take 3 capsules (60 mg total) by mouth daily. 90 capsule 2   guanFACINE (TENEX) 2 MG tablet Take 1 tablet (2 mg total) by mouth at bedtime. For ADHD 30 tablet 2   hydrOXYzine (ATARAX) 25 MG tablet TAKE ONE TABLET BY MOUTH THREE TIMES A DAY AS NEEDED 90 tablet 0   mirtazapine (REMERON) 30 MG tablet Take 1 tablet (30 mg total) by mouth at bedtime. 30 tablet 2   Oxcarbazepine (TRILEPTAL) 300 MG tablet Take 1 tablet (300 mg total) by mouth 2 (two) times daily. 60 tablet 2   QUEtiapine (SEROQUEL XR) 400 MG 24 hr tablet Take 2 tablets (800 mg total) by mouth at bedtime. 60 tablet 2   traZODone (DESYREL) 50 MG tablet Take 1 tablet (50 mg total) by mouth at bedtime as needed for sleep. 30 tablet 2   No current facility-administered medications for this visit.     Psychiatric Specialty Exam: Review of Systems  There were no vitals taken for this visit.There is no height or weight on  file to calculate BMI.  General Appearance: Fairly Groomed  Eye Contact:  Good  Speech:  Clear and Coherent and Normal Rate  Volume:  Normal  Mood:  Anxious and Euthymic  Affect:  Congruent  Thought Process:  Coherent and Goal Directed  Orientation:  Full (Time, Place, and Person)  Thought Content: Logical and Hallucinations: Visual Balls of light    Suicidal Thoughts:  No  Homicidal Thoughts:  No  Memory:  Immediate;   Good  Judgement:  Fair  Insight:  Lacking  Psychomotor Activity:  Normal  Concentration:  Concentration: Fair  Recall:  Good  Fund of Knowledge: Fair  Language: Good  Akathisia:  No    AIMS (if indicated): not done  Assets:  Communication Skills Desire for Improvement Financial Resources/Insurance Physical Health Resilience Talents/Skills Transportation Vocational/Educational  ADL's:  Intact  Cognition: WNL  Sleep:  Good    Metabolic Disorder Labs: Lab Results  Component Value Date   HGBA1C 5.0 11/25/2023   MPG 85.32 01/04/2022   No results found for: "PROLACTIN" Lab Results  Component Value Date   CHOL 274 (H) 11/25/2023   TRIG 423 (H) 11/25/2023   HDL 40 11/25/2023   CHOLHDL 3.2 01/04/2022   VLDL 21 01/04/2022   LDLCALC 154 (H) 11/25/2023   LDLCALC 81 01/04/2022   Lab Results  Component Value Date   TSH 1.942 01/04/2022    Therapeutic Level Labs: No results found for: "LITHIUM" No results found for: "VALPROATE" No results found for: "CBMZ"   Screenings: AIMS    Flowsheet Row [facility]  AIMS Total Score 0      AUDIT    Flowsheet Row Admission (Discharged) [facility]  Alcohol Use Disorder Identification Test Final Score (AUDIT) 0      GAD-7    Flowsheet Row Video Visit [facility]/09/2023 [facility]  Total GAD-7 Score 3      PHQ2-9    Flowsheet Row Video Visit from 02/09/2023 in [facility]  PHQ-2 Total Score 1      Flowsheet Row Video Visit from 02/09/2023 in [facility] Most recent reading at 02/09/2023 10:35 AM Admission (Discharged) [facility] Most recent reading at 01/02/2022  9:45 PM ED from 01/02/2022 in [facility] Department at [facility] Most recent reading at 01/02/2022  5:53 PM  C-SSRS RISK CATEGORY Low Risk High Risk High Risk       Collaboration of Care: Collaboration of Care: Medication Management AEB medication prescription  Patient/Guardian was advised Release of Information must be obtained prior to any record release in order to collaborate their care with an outside provider. Patient/Guardian was advised if they have not already done so to  contact the registration department to sign all necessary forms in order for [place] to release information regarding their care.   Consent: Patient/Guardian gives verbal consent for treatment and assignment of benefits for services provided during this visit. Patient/Guardian expressed understanding and agreed to proceed.    Stasia Cavalier, MD 03/27/2024, 10:57 AM   Virtual Visit via Video Note  I connected with Miguel Shea on 03/27/24 at 10:30 AM EDT by a video enabled telemedicine application and verified that I am speaking with the correct person using two identifiers.  Location: Patient: Home Provider: Home Office   I discussed the limitations  of evaluation and management by telemedicine and the availability of in person appointments. The patient expressed understanding and agreed to proceed.   I discussed the assessment and treatment plan with the patient. The patient was provided an opportunity to ask questions and all were answered. The patient agreed with the plan and demonstrated an understanding of the instructions.   The patient was advised to call back or seek an in-person evaluation if the symptoms worsen or if the condition fails to improve as anticipated.  30 minutes were spent in chart review, interview, psycho education, counseling, medical decision making, coordination of care and long-term prognosis.  Patient was given opportunity to ask question and all concerns and questions were addressed and answers. Excluding separately billable services.   Stasia Cavalier, MD

## 2024-03-27 ENCOUNTER — Encounter (HOSPITAL_COMMUNITY): Payer: Self-pay | Admitting: Psychiatry

## 2024-03-27 ENCOUNTER — Telehealth (HOSPITAL_COMMUNITY): Payer: Medicare Other | Admitting: Psychiatry

## 2024-03-27 DIAGNOSIS — F840 Autistic disorder: Secondary | ICD-10-CM | POA: Diagnosis not present

## 2024-03-27 DIAGNOSIS — F251 Schizoaffective disorder, depressive type: Secondary | ICD-10-CM | POA: Diagnosis not present

## 2024-03-27 MED ORDER — MIRTAZAPINE 30 MG PO TABS: 30.0000 mg | ORAL_TABLET | Freq: Every day | ORAL | 2 refills | Status: DC

## 2024-03-27 MED ORDER — FLUOXETINE HCL 20 MG PO CAPS: 60.0000 mg | ORAL_CAPSULE | Freq: Every day | ORAL | 2 refills | Status: DC

## 2024-03-27 MED ORDER — QUETIAPINE FUMARATE ER 400 MG PO TB24
800.0000 mg | ORAL_TABLET | Freq: Every day | ORAL | 2 refills | Status: DC
Start: 2024-03-27 — End: 2024-06-11

## 2024-03-27 MED ORDER — GUANFACINE HCL 2 MG PO TABS
2.0000 mg | ORAL_TABLET | Freq: Every day | ORAL | 2 refills | Status: DC
Start: 2024-03-27 — End: 2024-06-11

## 2024-03-27 MED ORDER — OXCARBAZEPINE 300 MG PO TABS: 300.0000 mg | ORAL_TABLET | Freq: Two times a day (BID) | ORAL | 2 refills | Status: DC

## 2024-03-27 MED ORDER — TRAZODONE HCL 50 MG PO TABS: 50.0000 mg | ORAL_TABLET | Freq: Every evening | ORAL | 2 refills | Status: DC | PRN

## 2024-05-04 ENCOUNTER — Ambulatory Visit (INDEPENDENT_AMBULATORY_CARE_PROVIDER_SITE_OTHER): Payer: Medicare Other | Admitting: Family

## 2024-05-04 ENCOUNTER — Encounter: Payer: Self-pay | Admitting: Family

## 2024-05-04 VITALS — BP 128/84 | HR 101 | Temp 98.9°F | Resp 18 | Ht 72.0 in | Wt 255.2 lb

## 2024-05-04 DIAGNOSIS — Z8719 Personal history of other diseases of the digestive system: Secondary | ICD-10-CM | POA: Diagnosis not present

## 2024-05-04 DIAGNOSIS — Z13228 Encounter for screening for other metabolic disorders: Secondary | ICD-10-CM

## 2024-05-04 DIAGNOSIS — Z13220 Encounter for screening for lipoid disorders: Secondary | ICD-10-CM

## 2024-05-04 DIAGNOSIS — K760 Fatty (change of) liver, not elsewhere classified: Secondary | ICD-10-CM

## 2024-05-04 DIAGNOSIS — Z7689 Persons encountering health services in other specified circumstances: Secondary | ICD-10-CM

## 2024-05-04 DIAGNOSIS — Z0130 Encounter for examination of blood pressure without abnormal findings: Secondary | ICD-10-CM | POA: Diagnosis not present

## 2024-05-04 NOTE — Progress Notes (Signed)
 Subjective:    Miguel Shea - 28 y.o. male MRN [id number]  Date of birth: [birth year]  HPI  Miguel Shea is to establish care.  Current issues and/or concerns: - Cholesterol check. States Psychiatry told him to come to Primary Care for follow-up. States Psychiatry told him that his elevated cholesterol may be related to psychiatric-related medication regimen but did not prescribe him any medication to help. States he is trying to watch what he eats.  - States history of fatty liver. States Psychiatry told him this may be related to psychiatric-related medication regimen or possibly family history. - Blood pressure check. States Psychiatry told him to come to Primary Care for follow-up. He does not complain of red flag symptoms such as but not limited to chest pain, shortness of breath, worst headache of life, nausea/vomiting.  - Established with Psychiatry.  - No further issues/concerns for discussion today.  ROS per HPI     Health Maintenance:  Health Maintenance Due  Topic Date Due   Medicare Annual Wellness (AWV)  Never done   HIV Screening  Never done   Hepatitis C Screening  Never done     Past Medical History: Patient Active Problem List   Diagnosis Date Noted   Encounter for long-term (current) use of medications 02/09/2023   Schizophrenia, paranoid (HCC) 01/03/2022   Schizoaffective disorder (HCC) 01/02/2022   Allergic rhinitis 07/12/2021   Anxiety 07/12/2021   Obesity 07/12/2021   Smoker 07/12/2021   Delusions (HCC) 03/03/2021   Autism spectrum disorder 12/29/2020   Bipolar 1 disorder (HCC) 12/29/2020      Social History   reports that he has never smoked. He has never used smokeless tobacco. He reports that he does not drink alcohol and does not use drugs.   Family History  family history is not on file.   Medications: reviewed and updated   Objective:   Physical Exam BP 128/84   Pulse (!) 101   Temp 98.9 F (37.2 C) (Oral)   Resp 18   Ht 6'  (1.829 m)   Wt 255 lb 3.2 oz (115.8 kg)   SpO2 97%   BMI 34.61 kg/m   Physical Exam HENT:     Head: Normocephalic and atraumatic.     Nose: Nose normal.     Mouth/Throat:     Mouth: Mucous membranes are moist.     Pharynx: Oropharynx is clear.  Eyes:     Extraocular Movements: Extraocular movements intact.     Conjunctiva/sclera: Conjunctivae normal.     Pupils: Pupils are equal, round, and reactive to light.  Cardiovascular:     Rate and Rhythm: Tachycardia present.     Pulses: Normal pulses.     Heart sounds: Normal heart sounds.  Pulmonary:     Effort: Pulmonary effort is normal.     Breath sounds: Normal breath sounds.  Musculoskeletal:        General: Normal range of motion.     Cervical back: Normal range of motion and neck supple.  Neurological:     General: No focal deficit present.     Mental Status: He is alert and oriented to person, place, and time.  Psychiatric:        Mood and Affect: Mood normal.        Behavior: Behavior normal.        Assessment & Plan:  1. Encounter to establish care (Primary) - Patient presents today to establish care. During the interim follow-up with primary provider as  scheduled.  - Return for annual physical examination, labs, and health maintenance. Arrive fasting meaning having no food for at least 8 hours prior to appointment. You may have only water or black coffee. Please take scheduled medications as normal.  2. Screening cholesterol level - Routine screening.  - Lipid panel  3. Screening for metabolic disorder - Routine screening.  - Hepatic Function Panel  4. History of fatty infiltration of liver - Referral to Gastroenterology for evaluation/management. - Ambulatory referral to Gastroenterology  5. Blood pressure check - Blood pressure normal today in office.  - Follow-up with primary provider as scheduled.    Patient was given clear instructions to go to Emergency Department or return to medical center if  symptoms don't improve, worsen, or new problems develop.The patient verbalized understanding.  I discussed the assessment and treatment plan with the patient. The patient was provided an opportunity to ask questions and all were answered. The patient agreed with the plan and demonstrated an understanding of the instructions.   The patient was advised to call back or seek an in-person evaluation if the symptoms worsen or if the condition fails to improve as anticipated.    Lavona Pounds, NP 05/04/2024, 1:56 PM Primary Care at [facility]

## 2024-05-04 NOTE — Progress Notes (Signed)
 Patient had blood work and lipid levels were high,patient vapes.  Patient has not had a check up in a long time

## 2024-05-05 ENCOUNTER — Other Ambulatory Visit: Payer: Self-pay | Admitting: Family

## 2024-05-05 ENCOUNTER — Encounter: Payer: Self-pay | Admitting: Family

## 2024-05-05 DIAGNOSIS — E785 Hyperlipidemia, unspecified: Secondary | ICD-10-CM

## 2024-05-05 LAB — HEPATIC FUNCTION PANEL
ALT: 55 IU/L — ABNORMAL HIGH (ref 0–44)
AST: 35 IU/L (ref 0–40)
Albumin: 5 g/dL (ref 4.3–5.2)
Alkaline Phosphatase: 134 IU/L — ABNORMAL HIGH (ref 44–121)
Bilirubin Total: 0.6 mg/dL (ref 0.0–1.2)
Bilirubin, Direct: 0.16 mg/dL (ref 0.00–0.40)
Total Protein: 7.1 g/dL (ref 6.0–8.5)

## 2024-05-05 LAB — LIPID PANEL
Chol/HDL Ratio: 5.1 ratio — ABNORMAL HIGH (ref 0.0–5.0)
Cholesterol, Total: 219 mg/dL — ABNORMAL HIGH (ref 100–199)
HDL: 43 mg/dL (ref >39)
LDL Chol Calc (NIH): 150 mg/dL — ABNORMAL HIGH (ref 0–99)
Triglycerides: 143 mg/dL (ref 0–149)
VLDL Cholesterol Cal: 26 mg/dL (ref 5–40)

## 2024-05-05 MED ORDER — ATORVASTATIN CALCIUM 20 MG PO TABS: 20.0000 mg | ORAL_TABLET | Freq: Every day | ORAL | 0 refills | Status: DC

## 2024-06-05 ENCOUNTER — Encounter: Admitting: Family

## 2024-06-08 NOTE — Progress Notes (Unsigned)
 BH MD/PA/NP OP Progress Note  06/08/2024 1:07 PM Miguel Shea  MRN:  [id number]  Visit Diagnosis:  No diagnosis found.  Assessment: Miguel Shea is a 28 y.o. male with a history of schizoaffective disorder and reported autism spectrum disorder who presented to [facility] for initial evaluation on 02/09/2023.  At initial evaluation patient reported symptoms of visual hallucinations, paranoia, negative symptoms of schizophrenia, increased irritability, and auditory hallucinations which are command in nature and tell him to hurt/kill himself at times.  Patient reported that these are less intrusive than in the past and easily ignored when on the Haldol  Decanoate or other antipsychotic medications.  Patient reported symptoms of lipsmacking and eye twitching which may be consistent with tardive dyskinesia.  Discussed the risk of being on multiple antipsychotics in addition to long-acting injectable.  Patient met criteria for schizoaffective disorder. He also carried a past dx of autism.   Miguel Shea presents for follow-up evaluation. Today, 06/08/24, patient reports that   mood has been stable with gradual improvement.  This appears to be secondary to patient adding more structure into his days and building a routine.  Patient does have intermittent episodes of anxiety which occur when there is concern about his routine being disrupted.  Discussed using coping skills and grounding techniques to manage this as opposed to further titrating medication.  Patient was agreeable and did identify that these episodes are not occurring frequently.  We will continue on his current regimen and follow-up in 3 months.  Psychotherapeutic interventions were used during today's session. From 10:35 AM to 10:55 AM we used empathic listening techniques and provided support. Worked on cognitive re framing techniques and focusing on behavioral activation.  Discussed  various grounding techniques to use during episodes of increased anxiety.  Improvement was evidenced by patient's participation.   Plan: - Continue Seroquel  XR to 400 mg BID  - Continue Trileptal  300 mg BID - Continue Prozac  60 mg QD - Continue Remeron  30 mg QHS - Continue Trazodone  50 mg QD - Continue Atarax  25 mg TID prn for anxiety - Continue guanfacine  2 mg every day - Recommended therapy, patient declined - CBC, CMP, lipid profile, A1c, TSH reviewed - Lipid panel from 11/25/23 showed elevated LDL, VLDL, cholesterol, and triglyceride - CT head w/o contrast reviewed - Crisis resources reviewed - PCP referral - Follow up in 2 months  Chief Complaint:  No chief complaint on file.  HPI: Miguel Shea presents reporting that    he has been doing pretty alright, with things improving overall. He has been able to get into a basic routine, improve his money situation, and having time since several negative life events.He feels that the medication is the right level, and it just took time for things to fall into place.  He reports that he sleeping well and his appetite has gone down some. As his mood improved he noticed that his appetite decreased.  There are times when he does find it more difficult to control his emotions. He describes this as him wanting to have control of the situation, but has doubts about what the right course of action. This is usually after there is a build up of stress like if he were to lose some of his stability, and he feels like things are out of control.  This last happened when he was worried about whether he needed to get a job if he loses his eligibility for disability. He had heard there is a reassessment  every 7 years and thinks he is coming up on that. Provided support around this and discussed the importance of using coping skills during these periods. He was able to identify spending time with his cats.  Reviewed other grounding techniques including box  breathing and diaphragmatic breathing.  Past Psychiatric History:  Patient has multiple past hospitalizations last one in January 2023. No prior suicide attempts  Patient has tried lithium (over sedating), Depakote  (leg swelling), Lamictal (irritability), Tegretol, Trileptal , Seroquel , Haldol  dec (200 mg Q3.5 weeks), Zyprexa , Risperdal , Prozac , Remeron , Atarax , guanfacine , propranolol, and trazodone .  Denies any substance use  Past Medical History:  Past Medical History:  Diagnosis Date   Bipolar 1 disorder (HCC)    Wolff-Parkinson-White syndrome    No past surgical history on file.  Family History: No family history on file.  Social History:  Social History   Socioeconomic History   Marital status: Single    Spouse name: Not on file   Number of children: Not on file   Years of education: Not on file   Highest education level: Associate degree: academic program  Occupational History   Not on file  Tobacco Use   Smoking status: Never   Smokeless tobacco: Never  Vaping Use   Vaping status: Some Days   Substances: Nicotine, Flavoring  Substance and Sexual Activity   Alcohol use: Never   Drug use: Never   Sexual activity: Not Currently  Other Topics Concern   Not on file  Social History Narrative   ** Merged History Encounter **       Social Drivers of Health   Financial Resource Strain: Low Risk  (04/30/2024)   Overall Financial Resource Strain (CARDIA)    Difficulty of Paying Living Expenses: Not hard at all  Food Insecurity: No Food Insecurity (04/30/2024)   Hunger Vital Sign    Worried About Running Out of Food in the Last Year: Never true    Ran Out of Food in the Last Year: Never true  Transportation Needs: No Transportation Needs (04/30/2024)   PRAPARE - Administrator, Civil Service (Medical): No    Lack of Transportation (Non-Medical): No  Physical Activity: Unknown (04/30/2024)   Exercise Vital Sign    Days of Exercise per Week: 0 days    Minutes  of Exercise per Session: Not on file  Stress: No Stress Concern Present (04/30/2024)   [employer] of Occupational Health - Occupational Stress Questionnaire    Feeling of Stress : Not at all  Social Connections: Socially Isolated (04/30/2024)   Social Connection and Isolation Panel [NHANES]    Frequency of Communication with Friends and Family: Three times a week    Frequency of Social Gatherings with Friends and Family: Once a week    Attends Religious Services: Never    Database administrator or Organizations: No    Attends Engineer, structural: Not on file    Marital Status: Never married    Allergies:  Allergies  Allergen Reactions   Augmentin [Amoxicillin-Pot Clavulanate] Rash    Current Medications: Current Outpatient Medications  Medication Sig Dispense Refill   atorvastatin  (LIPITOR) 20 MG tablet Take 1 tablet (20 mg total) by mouth daily. 90 tablet 0   FLUoxetine  (PROZAC ) 20 MG capsule Take 3 capsules (60 mg total) by mouth daily. 90 capsule 2   guanFACINE  (TENEX ) 2 MG tablet Take 1 tablet (2 mg total) by mouth at bedtime. For ADHD 30 tablet 2   hydrOXYzine  (ATARAX )  25 MG tablet TAKE ONE TABLET BY MOUTH THREE TIMES A DAY AS NEEDED 90 tablet 0   mirtazapine  (REMERON ) 30 MG tablet Take 1 tablet (30 mg total) by mouth at bedtime. 30 tablet 2   Oxcarbazepine  (TRILEPTAL ) 300 MG tablet Take 1 tablet (300 mg total) by mouth 2 (two) times daily. 60 tablet 2   QUEtiapine  (SEROQUEL  XR) 400 MG 24 hr tablet Take 2 tablets (800 mg total) by mouth at bedtime. 60 tablet 2   traZODone  (DESYREL ) 50 MG tablet Take 1 tablet (50 mg total) by mouth at bedtime as needed for sleep. 30 tablet 2   No current facility-administered medications for this visit.     Psychiatric Specialty Exam: Review of Systems  There were no vitals taken for this visit.There is no height or weight on file to calculate BMI.  General Appearance: Fairly Groomed  Eye Contact:  Good  Speech:  Clear and  Coherent and Normal Rate  Volume:  Normal  Mood:  Anxious and Euthymic  Affect:  Congruent  Thought Process:  Coherent and Goal Directed  Orientation:  Full (Time, Place, and Person)  Thought Content: Logical and Hallucinations: Visual Balls of light   Suicidal Thoughts:  No  Homicidal Thoughts:  No  Memory:  Immediate;   Good  Judgement:  Fair  Insight:  Lacking  Psychomotor Activity:  Normal  Concentration:  Concentration: Fair  Recall:  Good  Fund of Knowledge: Fair  Language: Good  Akathisia:  No    AIMS (if indicated): not done  Assets:  Communication Skills Desire for Improvement Financial Resources/Insurance Physical Health Resilience Talents/Skills Transportation Vocational/Educational  ADL's:  Intact  Cognition: WNL  Sleep:  Good   Metabolic Disorder Labs: Lab Results  Component Value Date   HGBA1C 5.0 11/25/2023   MPG 85.32 01/04/2022   No results found for: "PROLACTIN" Lab Results  Component Value Date   CHOL 219 (H) 05/04/2024   TRIG 143 05/04/2024   HDL 43 05/04/2024   CHOLHDL 5.1 (H) 05/04/2024   VLDL 21 01/04/2022   LDLCALC 150 (H) 05/04/2024   LDLCALC 154 (H) 11/25/2023   Lab Results  Component Value Date   TSH 1.942 01/04/2022    Therapeutic Level Labs: No results found for: "LITHIUM" No results found for: "VALPROATE" No results found for: "CBMZ"   Screenings: AIMS    Flowsheet Row [facility]  AIMS Total Score 0      AUDIT    Flowsheet Row Ad[facility] ([facility]charged[facility]  Alcohol Use Disorder Identification Test Final Score (AUDIT) 0      GAD-7    Flowsheet Row Office Visit from 05/04/2024 in [facility] Video Visit [facility]  Total GAD-7 Score 6 3      PHQ2-9    Flowsheet Row Office Visit from  05/04/2024 in [facility] Video Visit from 02/09/2023 [facility]  PHQ-2 Total Score 2 1  PHQ-9 Total Score 3 --      Flowsheet Row Video Visit from 02/09/2023 [facility] Most recent reading at 02/09/2023 10:35 AM Admission (Discharged) [facility] Most recent reading at 01/02/2022  9:45 PM ED from 01/02/2022 in [facility] Most recent reading at 01/02/2022  5:53 PM  C-SSRS RISK CATEGORY Low Risk High Risk High Risk       Collaboration of Care: Collaboration of Care: Medication Management AEB medication prescription and Primary Care Provider AEB chart review  Patient/Guardian was advised Release of Information must be obtained prior to any record release in order to collaborate their care with an outside provider. Patient/Guardian was advised if they have not already done so to contact the registration department to sign all necessary forms in order for us  to release information regarding their care.   Consent: Patient/Guardian gives verbal consent for treatment and assignment of benefits for services provided during this visit. Patient/Guardian expressed understanding and agreed to proceed.    Yves Herb, MD 06/08/2024, 1:07 PM   Virtual Visit via Video Note  I connected with Miguel Shea on 06/08/24 at 11:30 AM EDT by a video enabled telemedicine application and verified that I am speaking with the correct person using two identifiers.  Location: Patient: Home Provider: Home Office   I discussed the limitations of evaluation and management by telemedicine and the availability of in person appointments. The patient expressed understanding and agreed to proceed.   I discussed the assessment and treatment plan with the patient. The patient was provided an opportunity to ask  questions and all were answered. The patient agreed with the plan and demonstrated an understanding of the instructions.   The patient was advised to call back or seek an in-person evaluation if the symptoms worsen or if the condition fails to improve as anticipated.  30 minutes were spent in chart review, interview, psycho education, counseling, medical decision making, coordination of care and long-term prognosis.  Patient was given opportunity to ask question and all concerns and questions were addressed and answers. Excluding separately billable services.   Yves Herb, MD

## 2024-06-10 ENCOUNTER — Other Ambulatory Visit (HOSPITAL_COMMUNITY)
Admission: RE | Admit: 2024-06-10 | Discharge: 2024-06-10 | Disposition: A | Discharge status: Home / Self Care | Source: Ambulatory Visit | Attending: Family | Admitting: Family

## 2024-06-10 ENCOUNTER — Ambulatory Visit (INDEPENDENT_AMBULATORY_CARE_PROVIDER_SITE_OTHER): Admitting: Family

## 2024-06-10 ENCOUNTER — Encounter: Payer: Self-pay | Admitting: Family

## 2024-06-10 VITALS — BP 126/80 | HR 88 | Temp 99.1°F | Resp 16 | Ht 66.0 in | Wt 231.2 lb

## 2024-06-10 DIAGNOSIS — Z0000 Encounter for general adult medical examination without abnormal findings: Secondary | ICD-10-CM

## 2024-06-10 DIAGNOSIS — Z113 Encounter for screening for infections with a predominantly sexual mode of transmission: Secondary | ICD-10-CM | POA: Insufficient documentation

## 2024-06-10 DIAGNOSIS — Z1329 Encounter for screening for other suspected endocrine disorder: Secondary | ICD-10-CM

## 2024-06-10 DIAGNOSIS — E785 Hyperlipidemia, unspecified: Secondary | ICD-10-CM

## 2024-06-10 DIAGNOSIS — Z130 Encounter for screening for diseases of the blood and blood-forming organs and certain disorders involving the immune mechanism: Secondary | ICD-10-CM | POA: Diagnosis not present

## 2024-06-10 DIAGNOSIS — Z114 Encounter for screening for human immunodeficiency virus [HIV]: Secondary | ICD-10-CM

## 2024-06-10 DIAGNOSIS — B356 Tinea cruris: Secondary | ICD-10-CM | POA: Diagnosis not present

## 2024-06-10 DIAGNOSIS — Z13228 Encounter for screening for other metabolic disorders: Secondary | ICD-10-CM | POA: Diagnosis not present

## 2024-06-10 DIAGNOSIS — L309 Dermatitis, unspecified: Secondary | ICD-10-CM | POA: Diagnosis not present

## 2024-06-10 DIAGNOSIS — Z1159 Encounter for screening for other viral diseases: Secondary | ICD-10-CM

## 2024-06-10 DIAGNOSIS — Z131 Encounter for screening for diabetes mellitus: Secondary | ICD-10-CM | POA: Diagnosis not present

## 2024-06-10 MED ORDER — TRIAMCINOLONE ACETONIDE 0.025 % EX CREA: 1.0000 | TOPICAL_CREAM | Freq: Two times a day (BID) | CUTANEOUS | 2 refills | Status: DC

## 2024-06-10 MED ORDER — CLOTRIMAZOLE 1 % EX OINT: 1.0000 | TOPICAL_OINTMENT | Freq: Every day | CUTANEOUS | 2 refills | Status: DC

## 2024-06-10 NOTE — Progress Notes (Signed)
 Concern about cholesterol

## 2024-06-10 NOTE — Progress Notes (Signed)
 Patient ID: Miguel Shea, male    DOB: 11-30-1996  MRN: [id number]  CC: Annual Exam  Subjective: Miguel Shea is a 28 y.o. male who presents for annual exam.   His concerns today include:  - Doing well on Atorvastatin , no issues/concerns. He is watching what he eats. - Routine STD testing. Denies recent exposure/symptoms. - Eczema. Denies red flag symptoms. States he would like to know if it is something more. - States his private areas itch when he does not shower.  Patient Active Problem List   Diagnosis Date Noted   Encounter for long-term (current) use of medications 02/09/2023   Schizophrenia, paranoid (HCC) 01/03/2022   Schizoaffective disorder (HCC) 01/02/2022   Allergic rhinitis 07/12/2021   Anxiety 07/12/2021   Obesity 07/12/2021   Smoker 07/12/2021   Delusions (HCC) 03/03/2021   Autism spectrum disorder 12/29/2020   Bipolar 1 disorder (HCC) 12/29/2020     Current Outpatient Medications on File Prior to Visit  Medication Sig Dispense Refill   atorvastatin  (LIPITOR) 20 MG tablet Take 1 tablet (20 mg total) by mouth daily. 90 tablet 0   FLUoxetine  (PROZAC ) 20 MG capsule Take 3 capsules (60 mg total) by mouth daily. 90 capsule 2   guanFACINE  (TENEX ) 2 MG tablet Take 1 tablet (2 mg total) by mouth at bedtime. For ADHD 30 tablet 2   hydrOXYzine  (ATARAX ) 25 MG tablet TAKE ONE TABLET BY MOUTH THREE TIMES A DAY AS NEEDED 90 tablet 0   mirtazapine  (REMERON ) 30 MG tablet Take 1 tablet (30 mg total) by mouth at bedtime. 30 tablet 2   Oxcarbazepine  (TRILEPTAL ) 300 MG tablet Take 1 tablet (300 mg total) by mouth 2 (two) times daily. 60 tablet 2   QUEtiapine  (SEROQUEL  XR) 400 MG 24 hr tablet Take 2 tablets (800 mg total) by mouth at bedtime. 60 tablet 2   traZODone  (DESYREL ) 50 MG tablet Take 1 tablet (50 mg total) by mouth at bedtime as needed for sleep. 30 tablet 2   No current facility-administered medications on file prior to visit.    Allergies  Allergen  Reactions   Augmentin [Amoxicillin-Pot Clavulanate] Rash    Social History   Socioeconomic History   Marital status: Single    Spouse name: Not on file   Number of children: Not on file   Years of education: Not on file   Highest education level: Associate degree: academic program  Occupational History   Not on file  Tobacco Use   Smoking status: Never   Smokeless tobacco: Never  Vaping Use   Vaping status: Some Days   Substances: Nicotine, Flavoring  Substance and Sexual Activity   Alcohol use: Never   Drug use: Never   Sexual activity: Not Currently  Other Topics Concern   Not on file  Social History Narrative   ** Merged History Encounter **       Social Drivers of Health   Financial Resource Strain: Low Risk  (04/30/2024)   Overall Financial Resource Strain (CARDIA)    Difficulty of Paying Living Expenses: Not hard at all  Food Insecurity: No Food Insecurity (04/30/2024)   Hunger Vital Sign    Worried About Running Out of Food in the Last Year: Never true    Ran Out of Food in the Last Year: Never true  Transportation Needs: No Transportation Needs (04/30/2024)   PRAPARE - Administrator, Civil Service (Medical): No    Lack of Transportation (Non-Medical): No  Physical Activity: Unknown (  04/30/2024)   Exercise Vital Sign    Days of Exercise per Week: 0 days    Minutes of Exercise per Session: Not on file  Stress: No Stress Concern Present (04/30/2024)   [employer] of Occupational Health - Occupational Stress Questionnaire    Feeling of Stress : Not at all  Social Connections: Socially Isolated (04/30/2024)   Social Connection and Isolation Panel [NHANES]    Frequency of Communication with Friends and Family: Three times a week    Frequency of Social Gatherings with Friends and Family: Once a week    Attends Religious Services: Never    Database administrator or Organizations: No    Attends Engineer, structural: Not on file    Marital Status:  Never married  Intimate Partner Violence: Not At Risk (05/04/2024)   Humiliation, Afraid, Rape, and Kick questionnaire    Fear of Current or Ex-Partner: No    Emotionally Abused: No    Physically Abused: No    Sexually Abused: No    History reviewed. No pertinent family history.  History reviewed. No pertinent surgical history.  ROS: Review of Systems Negative except as stated above  PHYSICAL EXAM: BP 126/80   Pulse 88   Temp 99.1 F (37.3 C) (Oral)   Resp 16   Ht 5' 6 (1.676 m)   Wt 231 lb 3.2 oz (104.9 kg)   SpO2 96%   BMI 37.32 kg/m   Physical Exam HENT:     Head: Normocephalic and atraumatic.     Right Ear: Tympanic membrane, ear canal and external ear normal.     Left Ear: Tympanic membrane, ear canal and external ear normal.     Nose: Nose normal.     Mouth/Throat:     Mouth: Mucous membranes are moist.     Pharynx: Oropharynx is clear.  Eyes:     Extraocular Movements: Extraocular movements intact.     Conjunctiva/sclera: Conjunctivae normal.     Pupils: Pupils are equal, round, and reactive to light.  Neck:     Thyroid: No thyroid mass, thyromegaly or thyroid tenderness.  Cardiovascular:     Rate and Rhythm: Normal rate and regular rhythm.     Pulses: Normal pulses.     Heart sounds: Normal heart sounds.  Pulmonary:     Effort: Pulmonary effort is normal.     Breath sounds: Normal breath sounds.  Abdominal:     General: Bowel sounds are normal.     Palpations: Abdomen is soft.  Genitourinary:    Comments: Patient declined. Musculoskeletal:        General: Normal range of motion.     Right shoulder: Normal.     Left shoulder: Normal.     Right upper arm: Normal.     Left upper arm: Normal.     Right elbow: Normal.     Left elbow: Normal.     Right forearm: Normal.     Left forearm: Normal.     Right wrist: Normal.     Left wrist: Normal.     Right hand: Normal.     Left hand: Normal.     Cervical back: Normal, normal range of motion and neck  supple.     Thoracic back: Normal.     Lumbar back: Normal.     Right hip: Normal.     Left hip: Normal.     Right upper leg: Normal.     Left upper leg: Normal.  Right knee: Normal.     Left knee: Normal.     Right lower leg: Normal.     Left lower leg: Normal.     Right ankle: Normal.     Left ankle: Normal.     Right foot: Normal.     Left foot: Normal.  Skin:    General: Skin is warm and dry.     Capillary Refill: Capillary refill takes less than 2 seconds.     Comments: Eczema.  Neurological:     General: No focal deficit present.     Mental Status: He is alert and oriented to person, place, and time.  Psychiatric:        Mood and Affect: Mood normal.        Behavior: Behavior normal.     ASSESSMENT AND PLAN: 1. Annual physical exam (Primary) - Counseled on 150 minutes of exercise per week as tolerated, healthy eating (including decreased daily intake of saturated fats, cholesterol, added sugars, sodium), STI prevention, and routine healthcare maintenance.  2. Screening for metabolic disorder - Routine screening.  - Basic Metabolic Panel  3. Screening for deficiency anemia - Routine screening.  - CBC  4. Diabetes mellitus screening - Routine screening.  - Hemoglobin A1c  5. Hyperlipidemia, unspecified hyperlipidemia type - Continue Atorvastatin  as prescribed. Counseled on medication adherence/adverse effects. No refills needed as of present. - Routine screening.  - Follow-up with primary provider as scheduled. - Lipid panel  6. Thyroid disorder screen - Routine screening.  - TSH  7. Routine screening for STI (sexually transmitted infection) - Routine screening.  - Urine cytology ancillary only  8. Encounter for screening for HIV - Routine screening.  - HIV antibody (with reflex)  9. Need for hepatitis C screening test - Routine screening.  - Hepatitis C Antibody  10. Eczema, unspecified type - Triamcinolone Cream as prescribed. Counseled on  medication adherence/adverse effects.  - Referral to Dermatology for evaluation/management. - Follow-up with primary provider as scheduled. - triamcinolone (KENALOG) 0.025 % cream; Apply 1 Application topically 2 (two) times daily.  Dispense: 60 g; Refill: 2 - Ambulatory referral to Dermatology  11. Tinea cruris - Clotrimazole ointment as prescribed. Counseled on medication adherence/adverse effects.  - Follow-up with primary provider as scheduled. - Clotrimazole 1 % OINT; Apply 1 Application topically daily.  Dispense: 56.7 g; Refill: 2    Patient was given the opportunity to ask questions.  Patient verbalized understanding of the plan and was able to repeat key elements of the plan. Patient was given clear instructions to go to Emergency Department or return to medical center if symptoms don't improve, worsen, or new problems develop.The patient verbalized understanding.   Orders Placed This Encounter  Procedures   Basic Metabolic Panel   Lipid panel   CBC   Hemoglobin A1c   TSH   HIV antibody (with reflex)   Hepatitis C Antibody   Ambulatory referral to Dermatology     Requested Prescriptions   Signed Prescriptions Disp Refills   triamcinolone (KENALOG) 0.025 % cream 60 g 2    Sig: Apply 1 Application topically 2 (two) times daily.   Clotrimazole 1 % OINT 56.7 g 2    Sig: Apply 1 Application topically daily.    Return in about 1 year (around 06/10/2025) for Physical per patient preference.  Senaida Dama, NP

## 2024-06-11 ENCOUNTER — Telehealth (HOSPITAL_COMMUNITY): Admitting: Psychiatry

## 2024-06-11 ENCOUNTER — Ambulatory Visit: Payer: Self-pay | Admitting: Family

## 2024-06-11 ENCOUNTER — Encounter (HOSPITAL_COMMUNITY): Payer: Self-pay | Admitting: Psychiatry

## 2024-06-11 DIAGNOSIS — F840 Autistic disorder: Secondary | ICD-10-CM

## 2024-06-11 DIAGNOSIS — F251 Schizoaffective disorder, depressive type: Secondary | ICD-10-CM | POA: Diagnosis not present

## 2024-06-11 LAB — BASIC METABOLIC PANEL WITH GFR
BUN/Creatinine Ratio: 7 — ABNORMAL LOW (ref 9–20)
BUN: 8 mg/dL (ref 6–20)
CO2: 22 mmol/L (ref 20–29)
Calcium: 10.3 mg/dL — ABNORMAL HIGH (ref 8.7–10.2)
Chloride: 102 mmol/L (ref 96–106)
Creatinine, Ser: 1.09 mg/dL (ref 0.76–1.27)
Glucose: 79 mg/dL (ref 70–99)
Potassium: 4.6 mmol/L (ref 3.5–5.2)
Sodium: 142 mmol/L (ref 134–144)
eGFR: 97 mL/min/{1.73_m2} (ref >59)

## 2024-06-11 LAB — LIPID PANEL
Chol/HDL Ratio: 3.2 ratio (ref 0.0–5.0)
Cholesterol, Total: 155 mg/dL (ref 100–199)
HDL: 49 mg/dL (ref >39)
LDL Chol Calc (NIH): 84 mg/dL (ref 0–99)
Triglycerides: 125 mg/dL (ref 0–149)
VLDL Cholesterol Cal: 22 mg/dL (ref 5–40)

## 2024-06-11 LAB — URINE CYTOLOGY ANCILLARY ONLY
Chlamydia: NEGATIVE
Comment: NEGATIVE
Comment: NEGATIVE
Comment: NORMAL
Neisseria Gonorrhea: NEGATIVE
Trichomonas: NEGATIVE

## 2024-06-11 LAB — CBC
Hematocrit: 49 % (ref 37.5–51.0)
Hemoglobin: 15.9 g/dL (ref 13.0–17.7)
MCH: 30.6 pg (ref 26.6–33.0)
MCHC: 32.4 g/dL (ref 31.5–35.7)
MCV: 94 fL (ref 79–97)
Platelets: 259 10*3/uL (ref 150–450)
RBC: 5.2 x10E6/uL (ref 4.14–5.80)
RDW: 12.4 % (ref 11.6–15.4)
WBC: 6.7 10*3/uL (ref 3.4–10.8)

## 2024-06-11 LAB — TSH: TSH: 2.12 u[IU]/mL (ref 0.450–4.500)

## 2024-06-11 LAB — HEPATITIS C ANTIBODY: Hep C Virus Ab: NONREACTIVE

## 2024-06-11 LAB — HEMOGLOBIN A1C
Est. average glucose Bld gHb Est-mCnc: 88 mg/dL
Hgb A1c MFr Bld: 4.7 % — ABNORMAL LOW (ref 4.8–5.6)

## 2024-06-11 LAB — HIV ANTIBODY (ROUTINE TESTING W REFLEX): HIV Screen 4th Generation wRfx: NONREACTIVE

## 2024-06-11 MED ORDER — QUETIAPINE FUMARATE ER 400 MG PO TB24: 800.0000 mg | ORAL_TABLET | Freq: Every day | ORAL | 2 refills | Status: DC

## 2024-06-11 MED ORDER — GUANFACINE HCL 2 MG PO TABS
2.0000 mg | ORAL_TABLET | Freq: Every day | ORAL | 2 refills | Status: DC
Start: 2024-08-04 — End: 2024-09-10

## 2024-06-11 MED ORDER — TRAZODONE HCL 50 MG PO TABS: 50.0000 mg | ORAL_TABLET | Freq: Every evening | ORAL | 2 refills | Status: DC | PRN

## 2024-06-11 MED ORDER — FLUOXETINE HCL 20 MG PO CAPS: 60.0000 mg | ORAL_CAPSULE | Freq: Every day | ORAL | 2 refills | Status: DC

## 2024-06-11 MED ORDER — MIRTAZAPINE 30 MG PO TABS
30.0000 mg | ORAL_TABLET | Freq: Every day | ORAL | 2 refills | Status: DC
Start: 2024-08-04 — End: 2024-09-10

## 2024-06-11 MED ORDER — OXCARBAZEPINE 300 MG PO TABS: 300.0000 mg | ORAL_TABLET | Freq: Two times a day (BID) | ORAL | 2 refills | Status: DC

## 2024-09-07 ENCOUNTER — Other Ambulatory Visit: Payer: Self-pay | Admitting: Family

## 2024-09-07 DIAGNOSIS — E785 Hyperlipidemia, unspecified: Secondary | ICD-10-CM

## 2024-09-07 NOTE — Progress Notes (Unsigned)
 BH MD/PA/NP OP Progress Note  09/07/2024 2:07 PM Bedford Winsor  MRN:  [id number]  Visit Diagnosis:  No diagnosis found.   Assessment: Miguel Shea is a 28 y.o. male with a history of schizoaffective disorder and reported autism spectrum disorder who presented to [facility] for initial evaluation on 02/09/2023.  At initial evaluation patient reported symptoms of visual hallucinations, paranoia, negative symptoms of schizophrenia, increased irritability, and auditory hallucinations which are command in nature and tell him to hurt/kill himself at times.  Patient reported that these are less intrusive than in the past and easily ignored when on the Haldol  Decanoate or other antipsychotic medications.  Patient reported symptoms of lipsmacking and eye twitching which may be consistent with tardive dyskinesia.  Discussed the risk of being on multiple antipsychotics in addition to long-acting injectable.  Patient met criteria for schizoaffective disorder. He also carried a past dx of autism.   Miguel Shea presents for follow-up evaluation. Today, 09/07/24, patient reports that    mood continues to be stable with gradual improvement as he focuses on increasing his activity and keeping a positive mindset.  Psychotic symptoms with visual hallucinations and intrusive thoughts are still present but manageable with coping skills.  Patient did connect with a PCP in the interim started on medication to help his elevated cholesterol.  Recent blood work has improved.  We will continue on his current regimen and follow up in 3 months.  Plan: - Continue Seroquel  XR to 400 mg BID  - Continue Trileptal  300 mg BID - Continue Prozac  60 mg QD - Continue Remeron  30 mg QHS - Continue Trazodone  50 mg QD - Continue Atarax  25 mg TID prn for anxiety - Continue guanfacine  2 mg every day - Recommended therapy, patient declined - CBC, CMP, lipid profile, A1c, TSH  reviewed - Lipid panel from 11/25/23 showed elevated LDL, VLDL, cholesterol, and triglyceride - CT head w/o contrast reviewed - Crisis resources reviewed - PCP referral - Follow up in 3 months  Chief Complaint:  No chief complaint on file.  HPI: Miguel Shea presents reporting that    things are alright. He did connect with a PCP who started him on atorvastatin .  Since then his cholesterol levels have been checked a couple times and returned to normal limits.  From a psychiatric standpoint his mood has been stable and continuing to gradually improve as he works towards keeping a positive attitude.  It also focused on trying to stay productive.  The visual hallucinations are still present with the lights and associated strange thoughts.  These have not been overly intrusive however and he is able to move past them without difficulty.  Patient continues to take his medications consistently denying any adverse side effects.  Past Psychiatric History:  Patient has multiple past hospitalizations last one in January 2023. No prior suicide attempts  Patient has tried lithium (over sedating), Depakote  (leg swelling), Lamictal (irritability), Tegretol, Trileptal , Seroquel , Haldol  dec (200 mg Q3.5 weeks), Zyprexa , Risperdal , Prozac , Remeron , Atarax , guanfacine , propranolol, and trazodone .  Denies any substance use  Past Medical History:  Past Medical History:  Diagnosis Date   Bipolar 1 disorder (HCC)    Wolff-Parkinson-White syndrome    No past surgical history on file.  Family History: No family history on file.  Social History:  Social History   Socioeconomic History   Marital status: Single    Spouse name: Not on file   Number of children: Not on file   Years of  education: Not on file   Highest education level: Associate degree: academic program  Occupational History   Not on file  Tobacco Use   Smoking status: Never   Smokeless tobacco: Never  Vaping Use   Vaping status: Some  Days   Substances: Nicotine, Flavoring  Substance and Sexual Activity   Alcohol use: Never   Drug use: Never   Sexual activity: Not Currently  Other Topics Concern   Not on file  Social History Narrative   ** Merged History Encounter **       Social Drivers of Health   Financial Resource Strain: Low Risk  (04/30/2024)   Overall Financial Resource Strain (CARDIA)    Difficulty of Paying Living Expenses: Not hard at all  Food Insecurity: No Food Insecurity (04/30/2024)   Hunger Vital Sign    Worried About Running Out of Food in the Last Year: Never true    Ran Out of Food in the Last Year: Never true  Transportation Needs: No Transportation Needs (04/30/2024)   PRAPARE - Administrator, Civil Service (Medical): No    Lack of Transportation (Non-Medical): No  Physical Activity: Unknown (04/30/2024)   Exercise Vital Sign    Days of Exercise per Week: 0 days    Minutes of Exercise per Session: Not on file  Stress: No Stress Concern Present (04/30/2024)   [employer] of Occupational Health - Occupational Stress Questionnaire    Feeling of Stress : Not at all  Social Connections: Socially Isolated (04/30/2024)   Social Connection and Isolation Panel    Frequency of Communication with Friends and Family: Three times a week    Frequency of Social Gatherings with Friends and Family: Once a week    Attends Religious Services: Never    Database administrator or Organizations: No    Attends Engineer, structural: Not on file    Marital Status: Never married    Allergies:  Allergies  Allergen Reactions   Augmentin [Amoxicillin-Pot Clavulanate] Rash    Current Medications: Current Outpatient Medications  Medication Sig Dispense Refill   atorvastatin  (LIPITOR) 20 MG tablet Take 1 tablet (20 mg total) by mouth daily. 90 tablet 0   Clotrimazole  1 % OINT Apply 1 Application topically daily. 56.7 g 2   FLUoxetine  (PROZAC ) 20 MG capsule Take 3 capsules (60 mg total) by  mouth daily. 90 capsule 2   guanFACINE  (TENEX ) 2 MG tablet Take 1 tablet (2 mg total) by mouth at bedtime. For ADHD 30 tablet 2   hydrOXYzine  (ATARAX ) 25 MG tablet TAKE ONE TABLET BY MOUTH THREE TIMES A DAY AS NEEDED 90 tablet 0   mirtazapine  (REMERON ) 30 MG tablet Take 1 tablet (30 mg total) by mouth at bedtime. 30 tablet 2   Oxcarbazepine  (TRILEPTAL ) 300 MG tablet Take 1 tablet (300 mg total) by mouth 2 (two) times daily. 60 tablet 2   QUEtiapine  (SEROQUEL  XR) 400 MG 24 hr tablet Take 2 tablets (800 mg total) by mouth at bedtime. 60 tablet 2   traZODone  (DESYREL ) 50 MG tablet Take 1 tablet (50 mg total) by mouth at bedtime as needed for sleep. 30 tablet 2   triamcinolone  (KENALOG ) 0.025 % cream Apply 1 Application topically 2 (two) times daily. 60 g 2   No current facility-administered medications for this visit.     Psychiatric Specialty Exam: Review of Systems  There were no vitals taken for this visit.There is no height or weight on file to calculate BMI.  General Appearance: Well Groomed  Eye Contact:  Good  Speech:  Clear and Coherent and Normal Rate  Volume:  Normal  Mood:  Euthymic  Affect:  Congruent  Thought Process:  Coherent and Goal Directed  Orientation:  Full (Time, Place, and Person)  Thought Content: Logical and Hallucinations: Visual Balls of light   Suicidal Thoughts:  No  Homicidal Thoughts:  No  Memory:  Immediate;   Good  Judgement:  Fair  Insight:  Lacking  Psychomotor Activity:  Normal  Concentration:  Concentration: Fair  Recall:  Good  Fund of Knowledge: Fair  Language: Good  Akathisia:  No    AIMS (if indicated): not done  Assets:  Communication Skills Desire for Improvement Financial Resources/Insurance Physical Health Resilience Talents/Skills Transportation Vocational/Educational  ADL's:  Intact  Cognition: WNL  Sleep:  Good   Metabolic Disorder Labs: Lab Results  Component Value Date   HGBA1C 4.7 (L) 06/10/2024   MPG 85.32  01/04/2022   No results found for: PROLACTIN Lab Results  Component Value Date   CHOL 155 06/10/2024   TRIG 125 06/10/2024   HDL 49 06/10/2024   CHOLHDL 3.2 06/10/2024   VLDL 21 01/04/2022   LDLCALC 84 06/10/2024   LDLCALC 150 (H) 05/04/2024   Lab Results  Component Value Date   TSH 2.120 06/10/2024   TSH 1.942 01/04/2022    Therapeutic Level Labs: No results found for: LITHIUM No results found for: VALPROATE No results found for: CBMZ   Screenings: AIMS    Flowsheet Row [facility]  AIMS Total Score 0   AUDIT    Flowsheet Row Ad[facility] ([facility]  Alcohol Use Disorder Identification Test Final Score (AUDIT) 0   GAD-7    Flowsheet Row Office Visit from 06/10/2024 in [facility] Office Visit from 05/04/2024 in [facility] Video Visit from [facility]2023 [facility]  Total GAD-7 Score 4 6 3    PHQ2-9    Flowsheet Row Office Visit from 06/10/2024 in [facility] Office Visit from 05/04/2024 in [facility] Video Visit from 02/09/2023 [facility]  PHQ-2 Total Score 1 2 1   PHQ-9 Total Score 4 3 --   Flowsheet Row Video Visit from 02/09/2023 in [facility] Most recent reading at 02/09/2023 10:35 AM Ad[facility] (Discharged) [facility] Most recent reading at 01/02/2022  9:45 PM ED from 01/02/2022 in [facility] at [facility] Most recent reading at 01/02/2022  5:53 PM  C-SSRS RISK CATEGORY Low Risk High Risk High Risk    Collaboration of Care: Collaboration of Care: Medication Management AEB medication prescription and  Primary Care Provider AEB chart review  Patient/Guardian was advised Release of Information must be obtained prior to any record release in order to collaborate their care with an outside provider. Patient/Guardian was advised if they have not already done so to contact the registration department to sign all necessary forms in order for us  to release information regarding their care.   Consent: Patient/Guardian gives verbal consent for treatment and assignment of benefits for services provided during this visit. Patient/Guardian expressed understanding and agreed to proceed.    Arvella CHRISTELLA Finder, MD 09/07/2024, 2:07 PM   Virtual Visit  via Video Note  I connected with Miguel Shea on 09/07/24 at 11:30 AM EDT by a video enabled telemedicine application and verified that I am speaking with the correct person using two identifiers.  Location: Patient: Home Provider: Home Office   I discussed the limitations of evaluation and management by telemedicine and the availability of in person appointments. The patient expressed understanding and agreed to proceed.   I discussed the assessment and treatment plan with the patient. The patient was provided an opportunity to ask questions and all were answered. The patient agreed with the plan and demonstrated an understanding of the instructions.   The patient was advised to call back or seek an in-person evaluation if the symptoms worsen or if the condition fails to improve as anticipated.   I provided 15 minutes of non-face-to-face time during this encounter.  Arvella CHRISTELLA Finder, MD

## 2024-09-10 ENCOUNTER — Telehealth (HOSPITAL_COMMUNITY): Admitting: Psychiatry

## 2024-09-10 ENCOUNTER — Encounter (HOSPITAL_COMMUNITY): Payer: Self-pay | Admitting: Psychiatry

## 2024-09-10 DIAGNOSIS — F251 Schizoaffective disorder, depressive type: Secondary | ICD-10-CM | POA: Diagnosis not present

## 2024-09-10 MED ORDER — GUANFACINE HCL 2 MG PO TABS
2.0000 mg | ORAL_TABLET | Freq: Every day | ORAL | 2 refills | Status: AC
Start: 2024-09-10 — End: ?

## 2024-09-10 MED ORDER — OXCARBAZEPINE 300 MG PO TABS
300.0000 mg | ORAL_TABLET | Freq: Two times a day (BID) | ORAL | 2 refills | Status: AC
Start: 2024-09-10 — End: ?

## 2024-09-10 MED ORDER — FLUOXETINE HCL 20 MG PO CAPS: 60.0000 mg | ORAL_CAPSULE | Freq: Every day | ORAL | 2 refills | Status: DC

## 2024-09-10 MED ORDER — MIRTAZAPINE 30 MG PO TABS: 30.0000 mg | ORAL_TABLET | Freq: Every day | ORAL | 2 refills | Status: DC

## 2024-09-10 MED ORDER — QUETIAPINE FUMARATE ER 400 MG PO TB24: 800.0000 mg | ORAL_TABLET | Freq: Every day | ORAL | 2 refills | Status: DC

## 2024-09-10 MED ORDER — TRAZODONE HCL 50 MG PO TABS
50.0000 mg | ORAL_TABLET | Freq: Every evening | ORAL | 2 refills | Status: AC | PRN
Start: 2024-09-10 — End: ?

## 2024-10-05 ENCOUNTER — Other Ambulatory Visit: Payer: Self-pay | Admitting: Family

## 2024-10-05 DIAGNOSIS — L309 Dermatitis, unspecified: Secondary | ICD-10-CM

## 2024-10-19 ENCOUNTER — Ambulatory Visit

## 2024-11-19 ENCOUNTER — Ambulatory Visit (INDEPENDENT_AMBULATORY_CARE_PROVIDER_SITE_OTHER)

## 2024-11-19 VITALS — Ht 72.0 in | Wt 215.0 lb

## 2024-11-19 DIAGNOSIS — Z0000 Encounter for general adult medical examination without abnormal findings: Secondary | ICD-10-CM

## 2024-11-19 NOTE — Patient Instructions (Signed)
 Mr. Downie,  Thank you for taking the time for your Medicare Wellness Visit. I appreciate your continued commitment to your health goals. Please review the care plan we discussed, and feel free to reach out if I can assist you further.  Please note that Annual Wellness Visits do not include a physical exam. Some assessments may be limited, especially if the visit was conducted virtually. If needed, we may recommend an in-person follow-up with your provider.  Ongoing Care Seeing your primary care provider every 3 to 6 months helps us  monitor your health and provide consistent, personalized care.   Referrals If a referral was made during today's visit and you haven't received any updates within two weeks, please contact the referred provider directly to check on the status.  Recommended Screenings:  Health Maintenance  Topic Date Due   Medicare Annual Wellness Visit  Never done   COVID-19 Vaccine (1) Never done   Hepatitis B Vaccine (1 of 3 - 19+ 3-dose series) Never done   Flu Shot  07/31/2024   Pneumococcal Vaccine (1 of 2 - PCV) 05/04/2025*   HPV Vaccine (1 - Male 3-dose series) 05/04/2025*   DTaP/Tdap/Td vaccine (2 - Td or Tdap) 10/17/2031   Hepatitis C Screening  Completed   HIV Screening  Completed   Meningitis B Vaccine  Aged Out  *Topic was postponed. The date shown is not the original due date.       11/19/2024    3:01 PM  Advanced Directives  Does Patient Have a Medical Advance Directive? No  Would patient like information on creating a medical advance directive? No - Patient declined    Vision: Annual vision screenings are recommended for early detection of glaucoma, cataracts, and diabetic retinopathy. These exams can also reveal signs of chronic conditions such as diabetes and high blood pressure.  Dental: Annual dental screenings help detect early signs of oral cancer, gum disease, and other conditions linked to overall health, including heart disease and  diabetes.  Please see the attached documents for additional preventive care recommendations.

## 2024-11-19 NOTE — Progress Notes (Signed)
 Chief Complaint  Patient presents with   Medicare Wellness     Subjective:   Miguel Shea is a 28 y.o. male who presents for a Medicare Annual Wellness Visit.  Allergies (verified) Augmentin [amoxicillin-pot clavulanate]   History: Past Medical History:  Diagnosis Date   Bipolar 1 disorder (HCC)    Wolff-Parkinson-White syndrome    History reviewed. No pertinent surgical history. History reviewed. No pertinent family history. Social History   Occupational History   Not on file  Tobacco Use   Smoking status: Never   Smokeless tobacco: Never  Vaping Use   Vaping status: Former   Substances: Nicotine, Flavoring  Substance and Sexual Activity   Alcohol use: Never   Drug use: Never   Sexual activity: Not Currently   Tobacco Counseling Counseling given: Not Answered  SDOH Screenings   Food Insecurity: No Food Insecurity (11/17/2024)  Housing: Low Risk  (11/17/2024)  Transportation Needs: No Transportation Needs (11/17/2024)  Utilities: Not At Risk (11/19/2024)  Alcohol Screen: Low Risk  (11/17/2024)  Depression (PHQ2-9): Low Risk  (11/19/2024)  Financial Resource Strain: Low Risk  (11/17/2024)  Physical Activity: Inactive (11/17/2024)  Social Connections: Socially Isolated (11/17/2024)  Stress: No Stress Concern Present (11/17/2024)  Tobacco Use: Low Risk  (11/19/2024)  Health Literacy: Adequate Health Literacy (11/19/2024)   See flowsheets for full screening details  Depression Screen PHQ 2 & 9 Depression Scale- Over the past 2 weeks, how often have you been bothered by any of the following problems? Little interest or pleasure in doing things: 0 Feeling down, depressed, or hopeless (PHQ Adolescent also includes...irritable): 0 PHQ-2 Total Score: 0 Trouble falling or staying asleep, or sleeping too much: 0 Feeling tired or having little energy: 3 (medications) Poor appetite or overeating (PHQ Adolescent also includes...weight loss): 0 Feeling bad about  yourself - or that you are a failure or have let yourself or your family down: 0 Trouble concentrating on things, such as reading the newspaper or watching television (PHQ Adolescent also includes...like school work): 0 Moving or speaking so slowly that other people could have noticed. Or the opposite - being so fidgety or restless that you have been moving around a lot more than usual: 0 Thoughts that you would be better off dead, or of hurting yourself in some way: 0 PHQ-9 Total Score: 3 If you checked off any problems, how difficult have these problems made it for you to do your work, take care of things at home, or get along with other people?: Not difficult at all  Depression Treatment Depression Interventions/Treatment : EYV7-0 Score <4 Follow-up Not Indicated     Goals Addressed             This Visit's Progress    Patient Stated       11/19/2024, work on lowering cholesterol       Visit info / Clinical Intake: Medicare Wellness Visit Type:: Initial Annual Wellness Visit Persons participating in visit:: patient Medicare Wellness Visit Mode:: Video Because this visit was a virtual/telehealth visit:: unable to obtan vitals due to lack of equipment If Telephone or Video please confirm:: I connected with the patient using audio enabled telemedicine application and verified that I am speaking with the correct person using two identifiers; I discussed the limitations of evaluation and management by telemedicine; The patient expressed understanding and agreed to proceed Patient Location:: home Provider Location:: home office Information given by:: patient Interpreter Needed?: No Pre-visit prep was completed: yes AWV questionnaire completed by patient  prior to visit?: yes Date:: 11/17/24 Living arrangements:: (!) lives alone Patient's Overall Health Status Rating: good Typical amount of pain: some Does pain affect daily life?: no Are you currently prescribed opioids?:  no  Dietary Habits and Nutritional Risks How many meals a day?: 3 Eats fruit and vegetables daily?: yes Most meals are obtained by: preparing own meals In the last 2 weeks, have you had any of the following?: none Diabetic:: no  Functional Status Activities of Daily Living (to include ambulation/medication): Independent Ambulation: Independent Medication Administration: Independent Home Management: Independent Manage your own finances?: yes Primary transportation is: driving Concerns about vision?: no *vision screening is required for WTM* Concerns about hearing?: no  Fall Screening Falls in the past year?: 0 Number of falls in past year: 0 Was there an injury with Fall?: 0 Fall Risk Category Calculator: 0 Patient Fall Risk Level: Low Fall Risk  Fall Risk Patient at Risk for Falls Due to: Medication side effect Fall risk Follow up: Falls prevention discussed; Falls evaluation completed  Home and Transportation Safety: All rugs have non-skid backing?: N/A, no rugs All stairs or steps have railings?: yes Grab bars in the bathtub or shower?: (!) no Have non-skid surface in bathtub or shower?: (!) no Good home lighting?: yes Regular seat belt use?: yes Hospital stays in the last year:: no  Cognitive Assessment Difficulty concentrating, remembering, or making decisions? : yes Will 6CIT or Mini Cog be Completed: yes What year is it?: 0 points What month is it?: 0 points Give patient an address phrase to remember (5 components): [address] About what time is it?: 0 points Count backwards from 20 to 1: 0 points Say the months of the year in reverse: 4 points Repeat the address phrase from earlier: 0 points 6 CIT Score: 4 points  Advance Directives (For Healthcare) Does Patient Have a Medical Advance Directive?: No Would patient like information on creating a medical advance directive?: No - Patient declined  Reviewed/Updated  Reviewed/Updated: Reviewed All  (Medical, Surgical, Family, Medications, Allergies, Care Teams, Patient Goals)        Objective:    Today's Vitals   11/19/24 1451  Weight: 215 lb (97.5 kg)  Height: 6' (1.829 m)   Body mass index is 29.16 kg/m.  Current Medications (verified) Outpatient Encounter Medications as of 11/19/2024  Medication Sig   atorvastatin  (LIPITOR) 20 MG tablet TAKE 1 TABLET (20 MG TOTAL) BY MOUTH DAILY   guanFACINE  (TENEX ) 2 MG tablet Take 1 tablet (2 mg total) by mouth at bedtime. For ADHD   hydrOXYzine  (ATARAX ) 25 MG tablet TAKE ONE TABLET BY MOUTH THREE TIMES A DAY AS NEEDED   mirtazapine  (REMERON ) 30 MG tablet Take 1 tablet (30 mg total) by mouth at bedtime.   Oxcarbazepine  (TRILEPTAL ) 300 MG tablet Take 1 tablet (300 mg total) by mouth 2 (two) times daily.   QUEtiapine  (SEROQUEL  XR) 400 MG 24 hr tablet Take 2 tablets (800 mg total) by mouth at bedtime.   traZODone  (DESYREL ) 50 MG tablet Take 1 tablet (50 mg total) by mouth at bedtime as needed for sleep.   Clotrimazole  1 % OINT Apply 1 Application topically daily.   FLUoxetine  (PROZAC ) 20 MG capsule Take 3 capsules (60 mg total) by mouth daily.   triamcinolone  (KENALOG ) 0.025 % cream APPLY 1 APPLICATION TOPICALLY TWO (TWO) TIMES DAILY   No facility-administered encounter medications on file as of 11/19/2024.   Hearing/Vision screen Hearing Screening - Comments:: Denies hearing issues Vision  Screening - Comments:: No Regular eye exams,  Immunizations and Health Maintenance Health Maintenance  Topic Date Due   COVID-19 Vaccine (1) Never done   Hepatitis B Vaccines 19-59 Average Risk (1 of 3 - 19+ 3-dose series) Never done   Influenza Vaccine  07/31/2024   Pneumococcal Vaccine (1 of 2 - PCV) 05/04/2025 (Originally 09/11/2017)   HPV VACCINES (1 - Male 3-dose series) 05/04/2025 (Originally 09/11/2013)   Medicare Annual Wellness (AWV)  11/19/2025   DTaP/Tdap/Td (2 - Td or Tdap) 10/17/2031   Hepatitis C Screening  Completed   HIV Screening   Completed   Meningococcal B Vaccine  Aged Out        Assessment/Plan:  This is a routine wellness examination for Delorean.  Patient Care Team: Jaycee Greig PARAS, NP as PCP - General (Nurse Practitioner) Patient, No Pcp Per (General Practice) Carvin Arvella HERO, MD as Consulting Physician (Psychiatry)  I have personally reviewed and noted the following in the patient's chart:   Medical and social history Use of alcohol, tobacco or illicit drugs  Current medications and supplements including opioid prescriptions. Functional ability and status Nutritional status Physical activity Advanced directives List of other physicians Hospitalizations, surgeries, and ER visits in previous 12 months Vitals Screenings to include cognitive, depression, and falls Referrals and appointments  No orders of the defined types were placed in this encounter.  In addition, I have reviewed and discussed with patient certain preventive protocols, quality metrics, and best practice recommendations. A written personalized care plan for preventive services as well as general preventive health recommendations were provided to patient.   Ardella FORBES Dawn, LPN   [id number]/[id number]   Return in 1 year (on 11/19/2025).  After Visit Summary: (MyChart) Due to this being a telephonic visit, the after visit summary with patients personalized plan was offered to patient via MyChart   Nurse Notes: Declines vaccines.

## 2024-12-14 NOTE — Progress Notes (Unsigned)
 BH MD/PA/NP OP Progress Note  12/17/2024 11:24 AM Timber Lucarelli  MRN:  [id number]  Visit Diagnosis:  No diagnosis found.  Assessment: Miguel Shea is a 28 y.o. male with a history of schizoaffective disorder and reported autism spectrum disorder who presented to [facility] at [facility] for initial evaluation on 02/09/2023.  At initial evaluation patient reported symptoms of visual hallucinations, paranoia, negative symptoms of schizophrenia, increased irritability, and auditory hallucinations which are command in nature and tell him to hurt/kill himself at times.  Patient reported that these are less intrusive than in the past and easily ignored when on the Haldol  Decanoate or other antipsychotic medications.  Patient reported symptoms of lipsmacking and eye twitching which may be consistent with tardive dyskinesia.  Discussed the risk of being on multiple antipsychotics in addition to long-acting injectable.  Patient met criteria for schizoaffective disorder. He also carried a past dx of autism.   Zak Gondek presents for follow-up evaluation. Today, 12/17/2024, patient has had stable mood symptoms and hallucinations are well-controlled.  Patient experienced a 2 to 3-week period of feeling like his energy levels are going up.  He denied any change in insomnia or other concerns of hypomania.  He self discontinued fluoxetine  during these 3 weeks and restarted it as his mood returned to baseline.  Given patient's concern for triggering hypomania we discussed tapering Prozac  to 40 mg.  If well-tolerated we can continue to taper in the future.  Will continue on the remainder of current regimen and follow-up in 3 months.  Plan: - Continue Seroquel  XR to 400 mg BID  - Continue Trileptal  300 mg BID - Taper Prozac  to 40 mg QD - Continue Remeron  30 mg QHS - Continue Trazodone  50 mg QD - Continue Atarax  25 mg TID prn for anxiety - Continue guanfacine  2 mg every  day - Recommended therapy, patient declined - CBC, CMP, lipid profile, A1c, TSH reviewed - Repeat in June of 2026 - CT head w/o contrast reviewed - Crisis resources reviewed - PCP referral - Follow up in 3 months  Chief Complaint:  Chief Complaint  Patient presents with   Follow-up   HPI: Miguel Shea presents reporting that he is doing alright and things are going ok the last few months. He has been busy with family birthdays and holidays which he has enjoyed. Overall he has been able to manage things. He has not had much stress around this as it feels routine because he has been doing it for years.  Mood has been manageable with limited fluctuation over the past few months. By keeping to routine things have been easier and he is not faced with unexpected surprises. Financial stress is not as significant as it had been previously. He has picked up baking as a hobby and has kept busy with that which helps as well.   He has gained back some of the weight he lost, Clorinda believes it has been around 5-10 pounds.   He is taking medications and he feels that it has been adequate. He denies gaps where it feels like the medication is not working or leaving an opening somewhere. Of note he stopped the Prozac  a month ago as he felt that it was causing dry eyes, irritability, and giving him too much energy. He was off it for a couple weeks. As he started to feel his mood come back down he restarted the Prozac . Celedonio identifies that there was some increased stress at the time he stopped the Prozac   which may have led to the feeling that he was too high.   Past Psychiatric History:  Patient has multiple past hospitalizations last one in January 2023. No prior suicide attempts  Patient has tried lithium (over sedating), Depakote  (leg swelling), Lamictal (irritability), Tegretol, Trileptal , Seroquel , Haldol  dec (200 mg Q3.5 weeks), Zyprexa , Risperdal , Prozac , Remeron , Atarax , guanfacine , propranolol, and  trazodone .  Denies any substance use  Past Medical History:  Past Medical History:  Diagnosis Date   Bipolar 1 disorder (HCC)    Wolff-Parkinson-White syndrome    History reviewed. No pertinent surgical history.  Family History: History reviewed. No pertinent family history.  Social History:  Social History   Socioeconomic History   Marital status: Single    Spouse name: Not on file   Number of children: Not on file   Years of education: Not on file   Highest education level: Associate degree: academic program  Occupational History   Not on file  Tobacco Use   Smoking status: Never   Smokeless tobacco: Never  Vaping Use   Vaping status: Former   Substances: Nicotine, Flavoring  Substance and Sexual Activity   Alcohol use: Never   Drug use: Never   Sexual activity: Not Currently  Other Topics Concern   Not on file  Social History Narrative   ** Merged History Encounter **       Social Drivers of Health   Tobacco Use: Low Risk (12/17/2024)   Patient History    Smoking Tobacco Use: Never    Smokeless Tobacco Use: Never    Passive Exposure: Not on file  Financial Resource Strain: Low Risk (11/17/2024)   Overall Financial Resource Strain (CARDIA)    Difficulty of Paying Living Expenses: Not hard at all  Food Insecurity: No Food Insecurity (11/17/2024)   Epic    Worried About Programme Researcher, Broadcasting/film/video in the Last Year: Never true    Ran Out of Food in the Last Year: Never true  Transportation Needs: No Transportation Needs (11/17/2024)   Epic    Lack of Transportation (Medical): No    Lack of Transportation (Non-Medical): No  Physical Activity: Inactive (11/17/2024)   Exercise Vital Sign    Days of Exercise per Week: 0 days    Minutes of Exercise per Session: 0 min  Stress: No Stress Concern Present (11/17/2024)   [employer] of Occupational Health - Occupational Stress Questionnaire    Feeling of Stress: Not at all  Social Connections: Socially Isolated  (11/17/2024)   Social Connection and Isolation Panel    Frequency of Communication with Friends and Family: More than three times a week    Frequency of Social Gatherings with Friends and Family: Three times a week    Attends Religious Services: Never    Active Member of Clubs or Organizations: No    Attends Banker Meetings: Not on file    Marital Status: Never married  Depression (PHQ2-9): Low Risk (11/19/2024)   Depression (PHQ2-9)    PHQ-2 Score: 3  Alcohol Screen: Low Risk (11/17/2024)   Alcohol Screen    Last Alcohol Screening Score (AUDIT): 0  Housing: Low Risk (11/17/2024)   Epic    Unable to Pay for Housing in the Last Year: No    Number of Times Moved in the Last Year: 0    Homeless in the Last Year: No  Utilities: Not At Risk (11/19/2024)   Epic    Threatened with loss of utilities: No  Health Literacy: Adequate  Health Literacy (11/19/2024)   B1300 Health Literacy    Frequency of need for help with medical instructions: Never    Allergies:  Allergies  Allergen Reactions   Augmentin [Amoxicillin-Pot Clavulanate] Rash    Current Medications: Current Outpatient Medications  Medication Sig Dispense Refill   atorvastatin  (LIPITOR) 20 MG tablet TAKE 1 TABLET (20 MG TOTAL) BY MOUTH DAILY 90 tablet 0   Clotrimazole  1 % OINT Apply 1 Application topically daily. 56.7 g 2   FLUoxetine  (PROZAC ) 20 MG capsule Take 3 capsules (60 mg total) by mouth daily. 90 capsule 2   guanFACINE  (TENEX ) 2 MG tablet Take 1 tablet (2 mg total) by mouth at bedtime. For ADHD 30 tablet 2   hydrOXYzine  (ATARAX ) 25 MG tablet TAKE ONE TABLET BY MOUTH THREE TIMES A DAY AS NEEDED 90 tablet 0   mirtazapine  (REMERON ) 30 MG tablet Take 1 tablet (30 mg total) by mouth at bedtime. 30 tablet 2   Oxcarbazepine  (TRILEPTAL ) 300 MG tablet Take 1 tablet (300 mg total) by mouth 2 (two) times daily. 60 tablet 2   QUEtiapine  (SEROQUEL  XR) 400 MG 24 hr tablet Take 2 tablets (800 mg total) by mouth at  bedtime. 60 tablet 2   traZODone  (DESYREL ) 50 MG tablet Take 1 tablet (50 mg total) by mouth at bedtime as needed for sleep. 30 tablet 2   triamcinolone  (KENALOG ) 0.025 % cream APPLY 1 APPLICATION TOPICALLY TWO (TWO) TIMES DAILY 60 g 0   No current facility-administered medications for this visit.     Psychiatric Specialty Exam: Review of Systems  There were no vitals taken for this visit.There is no height or weight on file to calculate BMI.  General Appearance: Well Groomed  Eye Contact:  Good  Speech:  Clear and Coherent and Normal Rate  Volume:  Normal  Mood:  Euthymic  Affect:  Congruent  Thought Process:  Coherent and Goal Directed  Orientation:  Full (Time, Place, and Person)  Thought Content: Logical and Hallucinations: Visual Balls of light   Suicidal Thoughts:  No  Homicidal Thoughts:  No  Memory:  Immediate;   Good  Judgement:  Fair  Insight:  Lacking  Psychomotor Activity:  Normal  Concentration:  Concentration: Fair  Recall:  Good  Fund of Knowledge: Fair  Language: Good  Akathisia:  No    AIMS (if indicated): not done  Assets:  Communication Skills Desire for Improvement Financial Resources/Insurance Physical Health Resilience Talents/Skills Transportation Vocational/Educational  ADL's:  Intact  Cognition: WNL  Sleep:  Good   Metabolic Disorder Labs: Lab Results  Component Value Date   HGBA1C 4.7 (L) 06/10/2024   MPG 85.32 01/04/2022   No results found for: PROLACTIN Lab Results  Component Value Date   CHOL 155 06/10/2024   TRIG 125 06/10/2024   HDL 49 06/10/2024   CHOLHDL 3.2 06/10/2024   VLDL 21 01/04/2022   LDLCALC 84 06/10/2024   LDLCALC 150 (H) 05/04/2024   Lab Results  Component Value Date   TSH 2.120 06/10/2024   TSH 1.942 01/04/2022    Therapeutic Level Labs: No results found for: LITHIUM No results found for: VALPROATE No results found for: CBMZ   Screenings: AIMS    Flowsheet Row [facility]  AIMS Total Score 0   AUDIT    Flowsheet Row Ad[facility] (Discharged[facility]  Alcohol Use Disorder Identification Test Final Score (AUDIT) 0  GAD-7    Flowsheet Row Office Visit from 06/10/2024 in [facility] Office Visit from 05/04/2024 in [facility] at [facility] Video Visit from 02/09/2023 in [facility]  Total GAD-7 Score 4 6 3    PHQ2-9    Flowsheet Row Clinical Support from 11/19/2024 in [facility] at [facility] Office Visit from 06/10/2024 in [facility] Office Visit from 05/04/2024 in [facility] at [facility] Video Visit from [facility]/09[facility]2023 [facility]  PHQ-2 Total Score 0 1 2 1   PHQ-9 Total Score 3 4 3  --   Flowsheet Row Video Visit [facility] 02/09/2023 [facility] Most recent reading at 02/09/2023 10:35 AM Ad[facility] (Discharged) [facility] Most recent reading at 01/02/2022  9:45 PM ED from 01/02/2022 in [facility] at [facility] Most recent reading at 01/02/2022  5:53 PM  C-SSRS RISK CATEGORY Low Risk High Risk High Risk    Collaboration of Care: Collaboration of Care: Medication Management AEB medication prescription and Primary Care Provider AEB chart review  Patient/Guardian was advised Release of Information must be obtained prior to any record release in order to collaborate their care with an outside provider. Patient/Guardian was advised if they have not already done so to contact the registration department to sign all necessary forms in order for us  to release information regarding their care.   Consent: Patient/Guardian gives verbal consent for treatment and  assignment of benefits for services provided during this visit. Patient/Guardian expressed understanding and agreed to proceed.    Arvella CHRISTELLA Finder, MD 12/17/2024, 11:24 AM   Virtual Visit via Video Note  I connected with Donnice Ober on 12/17/2024 at 11:30 AM EST by a video enabled telemedicine application and verified that I am speaking with the correct person using two identifiers.  Location: Patient: Home Provider: Home Office   I discussed the limitations of evaluation and management by telemedicine and the availability of in person appointments. The patient expressed understanding and agreed to proceed.   I discussed the assessment and treatment plan with the patient. The patient was provided an opportunity to ask questions and all were answered. The patient agreed with the plan and demonstrated an understanding of the instructions.   The patient was advised to call back or seek an in-person evaluation if the symptoms worsen or if the condition fails to improve as anticipated.   I provided 15 minutes of non-face-to-face time during this encounter.  Arvella CHRISTELLA Finder, MD

## 2024-12-17 ENCOUNTER — Telehealth (HOSPITAL_COMMUNITY): Admitting: Psychiatry

## 2024-12-17 DIAGNOSIS — F251 Schizoaffective disorder, depressive type: Secondary | ICD-10-CM

## 2024-12-17 MED ORDER — MIRTAZAPINE 30 MG PO TABS: 30.0000 mg | ORAL_TABLET | Freq: Every day | ORAL | 2 refills | Status: AC

## 2024-12-17 MED ORDER — TRAZODONE HCL 50 MG PO TABS: 50.0000 mg | ORAL_TABLET | Freq: Every evening | ORAL | 2 refills | Status: AC | PRN

## 2024-12-17 MED ORDER — GUANFACINE HCL 2 MG PO TABS: 2.0000 mg | ORAL_TABLET | Freq: Every day | ORAL | 2 refills | Status: AC

## 2024-12-17 MED ORDER — HYDROXYZINE HCL 25 MG PO TABS: 25.0000 mg | ORAL_TABLET | Freq: Three times a day (TID) | ORAL | 0 refills | Status: AC | PRN

## 2024-12-17 MED ORDER — OXCARBAZEPINE 300 MG PO TABS: 300.0000 mg | ORAL_TABLET | Freq: Two times a day (BID) | ORAL | 2 refills | Status: AC

## 2024-12-17 MED ORDER — QUETIAPINE FUMARATE ER 400 MG PO TB24: 800.0000 mg | ORAL_TABLET | Freq: Every day | ORAL | 2 refills | Status: AC

## 2024-12-17 MED ORDER — FLUOXETINE HCL 40 MG PO CAPS: 40.0000 mg | ORAL_CAPSULE | Freq: Every day | ORAL | 0 refills | Status: AC

## 2025-02-25 ENCOUNTER — Telehealth (HOSPITAL_COMMUNITY): Admitting: Psychiatry

## 2025-03-04 ENCOUNTER — Telehealth (HOSPITAL_COMMUNITY): Admitting: Psychiatry

## 2025-06-11 ENCOUNTER — Encounter: Admitting: Family

## 2025-12-02 ENCOUNTER — Ambulatory Visit
